# Patient Record
Sex: Female | Born: 1965 | Race: White | Hispanic: No | Smoking: Never smoker
Health system: Southern US, Community
[De-identification: ages and names within clinical notes are randomized; demographics above are authoritative.]

## PROBLEM LIST (undated history)

## (undated) DIAGNOSIS — B019 Varicella without complication: Secondary | ICD-10-CM

## (undated) DIAGNOSIS — R51 Headache: Secondary | ICD-10-CM

## (undated) DIAGNOSIS — F329 Major depressive disorder, single episode, unspecified: Secondary | ICD-10-CM

## (undated) DIAGNOSIS — Z8489 Family history of other specified conditions: Secondary | ICD-10-CM

## (undated) DIAGNOSIS — Z9889 Other specified postprocedural states: Secondary | ICD-10-CM

## (undated) DIAGNOSIS — M1990 Unspecified osteoarthritis, unspecified site: Secondary | ICD-10-CM

## (undated) DIAGNOSIS — F32A Depression, unspecified: Secondary | ICD-10-CM

## (undated) DIAGNOSIS — K219 Gastro-esophageal reflux disease without esophagitis: Secondary | ICD-10-CM

## (undated) DIAGNOSIS — Z86018 Personal history of other benign neoplasm: Secondary | ICD-10-CM

## (undated) DIAGNOSIS — N390 Urinary tract infection, site not specified: Secondary | ICD-10-CM

## (undated) DIAGNOSIS — N6019 Diffuse cystic mastopathy of unspecified breast: Secondary | ICD-10-CM

## (undated) DIAGNOSIS — R112 Nausea with vomiting, unspecified: Secondary | ICD-10-CM

## (undated) DIAGNOSIS — I479 Paroxysmal tachycardia, unspecified: Secondary | ICD-10-CM

## (undated) DIAGNOSIS — M779 Enthesopathy, unspecified: Secondary | ICD-10-CM

## (undated) HISTORY — DX: Varicella without complication: B01.9

## (undated) HISTORY — DX: Urinary tract infection, site not specified: N39.0

## (undated) HISTORY — DX: Diffuse cystic mastopathy of unspecified breast: N60.19

## (undated) HISTORY — DX: Depression, unspecified: F32.A

## (undated) HISTORY — PX: DIAGNOSTIC LAPAROSCOPY: SUR761

## (undated) HISTORY — PX: BREAST CYST ASPIRATION: SHX578

## (undated) HISTORY — DX: Major depressive disorder, single episode, unspecified: F32.9

## (undated) HISTORY — DX: Enthesopathy, unspecified: M77.9

## (undated) HISTORY — DX: Headache: R51

## (undated) HISTORY — PX: TUBAL LIGATION: SHX77

---
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
## 1987-06-26 HISTORY — PX: ECTOPIC PREGNANCY SURGERY: SHX613

## 1989-06-25 HISTORY — PX: DIAGNOSTIC LAPAROSCOPY: SUR761

## 1990-06-25 HISTORY — PX: OTHER SURGICAL HISTORY: SHX169

## 1991-06-26 HISTORY — PX: ECTOPIC PREGNANCY SURGERY: SHX613

## 2000-06-25 HISTORY — PX: UTERINE FIBROID SURGERY: SHX826

## 2002-06-25 HISTORY — PX: OTHER SURGICAL HISTORY: SHX169

## 2002-06-25 HISTORY — PX: UTERINE FIBROID SURGERY: SHX826

## 2005-03-13 ENCOUNTER — Ambulatory Visit: Payer: Self-pay | Admitting: General Surgery

## 2005-11-02 ENCOUNTER — Ambulatory Visit: Payer: Self-pay | Admitting: Unknown Physician Specialty

## 2005-12-31 ENCOUNTER — Ambulatory Visit: Payer: Self-pay | Admitting: Unknown Physician Specialty

## 2006-04-01 ENCOUNTER — Ambulatory Visit: Payer: Self-pay | Admitting: General Surgery

## 2006-06-03 ENCOUNTER — Ambulatory Visit: Payer: Self-pay | Admitting: Unknown Physician Specialty

## 2006-06-25 HISTORY — PX: ABDOMINAL HYSTERECTOMY: SHX81

## 2006-06-25 HISTORY — PX: COLONOSCOPY: SHX174

## 2006-11-24 LAB — HM COLONOSCOPY

## 2007-01-28 ENCOUNTER — Ambulatory Visit: Payer: Self-pay | Admitting: Family Medicine

## 2007-02-03 ENCOUNTER — Ambulatory Visit: Payer: Self-pay | Admitting: Family Medicine

## 2007-03-07 DIAGNOSIS — Z86018 Personal history of other benign neoplasm: Secondary | ICD-10-CM

## 2007-03-07 HISTORY — DX: Personal history of other benign neoplasm: Z86.018

## 2007-03-24 ENCOUNTER — Ambulatory Visit: Payer: Self-pay | Admitting: Unknown Physician Specialty

## 2007-03-27 ENCOUNTER — Ambulatory Visit: Payer: Self-pay | Admitting: Unknown Physician Specialty

## 2007-04-29 ENCOUNTER — Ambulatory Visit: Payer: Self-pay | Admitting: General Surgery

## 2008-05-04 ENCOUNTER — Ambulatory Visit: Payer: Self-pay | Admitting: General Surgery

## 2008-11-10 ENCOUNTER — Ambulatory Visit: Payer: Self-pay | Admitting: Family Medicine

## 2009-06-20 ENCOUNTER — Ambulatory Visit: Payer: Self-pay | Admitting: General Surgery

## 2009-06-25 HISTORY — PX: BUNIONECTOMY: SHX129

## 2010-06-28 ENCOUNTER — Ambulatory Visit: Payer: Self-pay | Admitting: General Surgery

## 2011-03-08 ENCOUNTER — Ambulatory Visit: Payer: Self-pay | Admitting: Family Medicine

## 2011-06-26 DIAGNOSIS — N6019 Diffuse cystic mastopathy of unspecified breast: Secondary | ICD-10-CM

## 2011-06-26 HISTORY — DX: Diffuse cystic mastopathy of unspecified breast: N60.19

## 2011-08-29 ENCOUNTER — Ambulatory Visit: Payer: Self-pay | Admitting: General Surgery

## 2012-05-05 ENCOUNTER — Encounter: Payer: Self-pay | Admitting: Internal Medicine

## 2012-05-05 ENCOUNTER — Ambulatory Visit (INDEPENDENT_AMBULATORY_CARE_PROVIDER_SITE_OTHER): Payer: 59 | Admitting: Internal Medicine

## 2012-05-05 VITALS — BP 96/60 | HR 61 | Temp 97.9°F | Ht 64.5 in | Wt 121.5 lb

## 2012-05-05 DIAGNOSIS — D709 Neutropenia, unspecified: Secondary | ICD-10-CM

## 2012-05-05 DIAGNOSIS — R51 Headache: Secondary | ICD-10-CM

## 2012-05-05 DIAGNOSIS — R6889 Other general symptoms and signs: Secondary | ICD-10-CM

## 2012-05-05 NOTE — Patient Instructions (Addendum)
Keep a headache diary for 3 weeks to correlate your caffeine intake and exercise with occurrence of headaches  Try to limit used of Alleve and excedrin to not more than once per week  If headaches continue with this frequency we may try a preventive medication  return in January for repeat CBC and CMET (labs,  No fasting required)

## 2012-05-05 NOTE — Progress Notes (Signed)
Patient ID: Cynthia Houston, female   DOB: September 04, 1965, 46 y.o.   MRN: [id number] Patient Active Problem List  Diagnosis  . Headache  . ASCUS (atypical squamous cells of undetermined significance) on Pap smear  . Neutropenia    Subjective:  CC:   Chief Complaint  Patient presents with  . Establish Care    HPI:   [name] is a 46 y.o. female who presents as a new patient to establish primary care with the chief complaint of   Past Medical History  Diagnosis Date  . Chicken pox   . Depression   . Headache   . Urinary tract infection     Past Surgical History  Procedure Date  . Breast biopsy   . Appendectomy   . Ectopic pregnancy surgery 1989  . Uterine fibroid surgery 2002    x2 2004  . Abdominal hysterectomy 2008    supracervical     Family History  Problem Relation Age of Onset  . Hyperlipidemia Mother   . Hypertension Mother   . Arthritis Mother     rheumatoid arthritis  . Crohn's disease Father   . Heart disease Father 82    CABG  . Hyperlipidemia Father   . Hypertension Father     History   Social History  . Marital Status: Single    Spouse Name: N/A    Number of Children: N/A  . Years of Education: N/A   Occupational History  . Not on file.   Social History Main Topics  . Smoking status: Never Smoker   . Smokeless tobacco: Not on file  . Alcohol Use: Yes     Comment: occassionally  . Drug Use: Not on file  . Sexually Active: Not on file   Other Topics Concern  . Not on file   Social History Narrative  . No narrative on file         @ALLHX @    Review of Systems:   The remainder of the review of systems was negative except those addressed in the HPI.       Objective:  BP 96/60  Pulse 61  Temp 97.9 F (36.6 C) (Oral)  Ht 5' 4.5" (1.638 m)  Wt 121 lb 8 oz (55.112 kg)  BMI 20.53 kg/m2  SpO2 98%  General appearance: alert, cooperative and appears stated age Ears: normal TM's and external ear canals both  ears Throat: lips, mucosa, and tongue normal; teeth and gums normal Neck: no adenopathy, no carotid bruit, supple, symmetrical, trachea midline and thyroid not enlarged, symmetric, no tenderness/mass/nodules Back: symmetric, no curvature. ROM normal. No CVA tenderness. Lungs: clear to auscultation bilaterally Heart: regular rate and rhythm, S1, S2 normal, no murmur, click, rub or gallop Abdomen: soft, non-tender; bowel sounds normal; no masses,  no organomegaly Pulses: 2+ and symmetric Skin: Skin color, texture, turgor normal. No rashes or lesions Lymph nodes: Cervical, supraclavicular, and axillary nodes normal.  Assessment and Plan:  Headache And a new onset with averaging 2-3 per week. ALG unclear. No signs of cluster headache. No history of hypertension. No history of migraines. Neurological exam is normal. Her symptoms may be due to neck pain due to and eye strain due to chronic use of computer and handheld phone  Versus early development of medication rebound due to frequent use of NSAIDs.. Asked her  To keep a diary and to limit her use of NSAIDs to no more than once per week.  ASCUS (atypical squamous cells of undetermined significance) on  Pap smear On 2012. 2013 Pap Smear Was Normal. She Will Return in May 2014 for Repeat Pap Smear. HPVs Were Negative.  Neutropenia She has had a mild leukopenia with mild neutropenia for over a year. Serial CBCs from her previous PCP have been stable. She has no history of recurrent infections. Reassurance provided that there is no further workup indicated at this time.   Updated Medication List No outpatient encounter prescriptions on file as of 05/05/2012.     Orders Placed This Encounter  Procedures  . HM PAP SMEAR    No Follow-up on file.

## 2012-05-07 DIAGNOSIS — Z8742 Personal history of other diseases of the female genital tract: Secondary | ICD-10-CM

## 2012-05-07 DIAGNOSIS — D709 Neutropenia, unspecified: Secondary | ICD-10-CM | POA: Insufficient documentation

## 2012-05-07 DIAGNOSIS — R51 Headache: Secondary | ICD-10-CM | POA: Insufficient documentation

## 2012-05-07 DIAGNOSIS — R519 Headache, unspecified: Secondary | ICD-10-CM | POA: Insufficient documentation

## 2012-05-07 HISTORY — DX: Personal history of other diseases of the female genital tract: Z87.42

## 2012-05-07 NOTE — Assessment & Plan Note (Signed)
And a new onset with averaging 2-3 per week. ALG unclear. No signs of cluster headache. No history of hypertension. No history of migraines. Neurological exam is normal. Her symptoms may be due to neck pain due to and eye strain due to chronic use of computer and handheld phone  Versus early development of medication rebound due to frequent use of NSAIDs.. Asked her  To keep a diary and to limit her use of NSAIDs to no more than once per week.

## 2012-05-07 NOTE — Assessment & Plan Note (Signed)
On 2012. 2013 Pap Smear Was Normal. She Will Return in May 2014 for Repeat Pap Smear. HPVs Were Negative.

## 2012-05-07 NOTE — Assessment & Plan Note (Signed)
She has had a mild leukopenia with mild neutropenia for over a year. Serial CBCs from her previous PCP have been stable. She has no history of recurrent infections. Reassurance provided that there is no further workup indicated at this time.

## 2012-08-02 ENCOUNTER — Encounter: Payer: Self-pay | Admitting: *Deleted

## 2012-08-09 ENCOUNTER — Other Ambulatory Visit: Payer: Self-pay

## 2012-09-01 ENCOUNTER — Ambulatory Visit: Payer: Self-pay | Admitting: General Surgery

## 2012-09-18 ENCOUNTER — Ambulatory Visit: Payer: Self-pay | Admitting: General Surgery

## 2012-09-24 ENCOUNTER — Encounter: Payer: Self-pay | Admitting: General Surgery

## 2012-09-24 ENCOUNTER — Ambulatory Visit (INDEPENDENT_AMBULATORY_CARE_PROVIDER_SITE_OTHER): Payer: 59 | Admitting: General Surgery

## 2012-09-24 VITALS — BP 116/70 | HR 78 | Resp 12 | Ht 63.0 in | Wt 116.0 lb

## 2012-09-24 DIAGNOSIS — Z1231 Encounter for screening mammogram for malignant neoplasm of breast: Secondary | ICD-10-CM

## 2012-09-24 DIAGNOSIS — N6019 Diffuse cystic mastopathy of unspecified breast: Secondary | ICD-10-CM

## 2012-09-24 NOTE — Patient Instructions (Addendum)
Patient will be asked to return to the office in one year with a bilateral screening mammogram. 

## 2012-09-24 NOTE — Progress Notes (Signed)
Patient ID: Cynthia Houston, female   DOB: 02-01-1966, 47 y.o.   MRN: [id number]  Chief Complaint  Patient presents with  . Follow-up    mammogram    HPI Cynthia Houston is a 47 y.o. female here troday for her follow up mammgram 3/10/14cat 2 . Patient had a right breast biopsy in past . No family history of cancers.   No new breast problems.History of FCD.  Marland KitchenHPI Past Medical History  Diagnosis Date  . Chicken pox   . Depression   . Headache   . Urinary tract infection   . Tendinitis     right elbow  . Diffuse cystic mastopathy 2013    Past Surgical History  Procedure Laterality Date  . Breast biopsy    . Appendectomy    . Ectopic pregnancy surgery  1989  . Uterine fibroid surgery  2002    x2 2004  . Abdominal hysterectomy  2008    supracervical   . Gynecological surgery   2004  . Diagnostic laparoscopy    . Tubal ligation    . Bunionectomy Right 2011  . Colonoscopy  2008    Dr. Mechele Collin    Family History  Problem Relation Age of Onset  . Hyperlipidemia Mother   . Hypertension Mother   . Arthritis Mother     rheumatoid arthritis  . Crohn's disease Father   . Heart disease Father 66    CABG  . Hyperlipidemia Father   . Hypertension Father     Social History History  Substance Use Topics  . Smoking status: Never Smoker   . Smokeless tobacco: Never Used  . Alcohol Use: No     Comment: occassionally    Allergies  Allergen Reactions  . Augmentin (Amoxicillin-Pot Clavulanate)   . Percocet (Oxycodone-Acetaminophen)     Current Outpatient Prescriptions  Medication Sig Dispense Refill  . Multiple Vitamins-Minerals (MULTIVITAMIN & MINERAL PO) Take by mouth.      . Pseudoephedrine HCl (BL 12 HOUR COLD PO) Take by mouth.       No current facility-administered medications for this visit.    Review of Systems Review of Systems  Constitutional: Negative.   Respiratory: Negative.   Cardiovascular: Negative.     Blood pressure 116/70, pulse 78, resp. rate 12,  height 5\' 3"  (1.6 m), weight 116 lb (52.617 kg).  Physical Exam Physical Exam  Constitutional: She appears well-developed and well-nourished.  Eyes: Conjunctivae are normal.  Neck: Normal range of motion. Neck supple.  Cardiovascular: Normal rate, regular rhythm and normal heart sounds.   Pulmonary/Chest: Effort normal and breath sounds normal. Right breast exhibits mass. Right breast exhibits no inverted nipple, no nipple discharge, no skin change and no tenderness. Left breast exhibits no inverted nipple, no mass, no nipple discharge, no skin change and no tenderness.    Abdominal: Soft. Bowel sounds are normal.  Lymphadenopathy:    She has no cervical adenopathy.    She has no axillary adenopathy.    Data Reviewed Mammogram reviewed-birads2.   Assessment    Stable breast exam     Plan    Routine 1 yr f/u         [facility] 09/26/2012, 11:54 AM

## 2012-09-26 ENCOUNTER — Encounter: Payer: Self-pay | Admitting: General Surgery

## 2012-11-04 ENCOUNTER — Encounter: Payer: Self-pay | Admitting: General Surgery

## 2012-11-19 ENCOUNTER — Encounter: Payer: Self-pay | Admitting: Internal Medicine

## 2012-11-19 ENCOUNTER — Ambulatory Visit (INDEPENDENT_AMBULATORY_CARE_PROVIDER_SITE_OTHER): Payer: 59 | Admitting: Internal Medicine

## 2012-11-19 ENCOUNTER — Other Ambulatory Visit (HOSPITAL_COMMUNITY)
Admission: RE | Admit: 2012-11-19 | Discharge: 2012-11-19 | Disposition: A | Discharge status: Home / Self Care | Payer: 59 | Source: Ambulatory Visit | Attending: Internal Medicine | Admitting: Internal Medicine

## 2012-11-19 VITALS — BP 108/72 | HR 61 | Temp 97.6°F | Resp 16 | Ht 63.0 in | Wt 118.0 lb

## 2012-11-19 DIAGNOSIS — R6889 Other general symptoms and signs: Secondary | ICD-10-CM

## 2012-11-19 DIAGNOSIS — E538 Deficiency of other specified B group vitamins: Secondary | ICD-10-CM

## 2012-11-19 DIAGNOSIS — Z0000 Encounter for general adult medical examination without abnormal findings: Secondary | ICD-10-CM

## 2012-11-19 DIAGNOSIS — Z1151 Encounter for screening for human papillomavirus (HPV): Secondary | ICD-10-CM | POA: Insufficient documentation

## 2012-11-19 DIAGNOSIS — N390 Urinary tract infection, site not specified: Secondary | ICD-10-CM

## 2012-11-19 DIAGNOSIS — K5900 Constipation, unspecified: Secondary | ICD-10-CM

## 2012-11-19 DIAGNOSIS — Z01419 Encounter for gynecological examination (general) (routine) without abnormal findings: Secondary | ICD-10-CM | POA: Insufficient documentation

## 2012-11-19 DIAGNOSIS — G4700 Insomnia, unspecified: Secondary | ICD-10-CM

## 2012-11-19 LAB — POCT URINALYSIS DIPSTICK
Ketones, UA: NEGATIVE
Protein, UA: NEGATIVE
pH, UA: 7

## 2012-11-19 MED ORDER — ALPRAZOLAM 0.25 MG PO TABS: 0.2500 mg | ORAL_TABLET | Freq: Every evening | ORAL | Status: DC | PRN

## 2012-11-19 MED ORDER — CIPROFLOXACIN HCL 250 MG PO TABS: 250.0000 mg | ORAL_TABLET | Freq: Two times a day (BID) | ORAL | Status: DC

## 2012-11-19 NOTE — Patient Instructions (Addendum)
I sent an rx for cipro to your pharmacy to cover your UTI  Trial of alprazolam for insomnia ,  Use only as needed   Return for fasting labs at your convenience.  Your constipation is cuasing your abdominal pain.  Try getting 25 to 35 g fiber in your diet daily using high fiber low carb flatbreads,  And Atkins bars as snacks,  You cna also try the low  Carb pasta from [employer] ([employer]. [employer] and [employer])  [employer] "Carb balance?  (2 sizes ) whole wheat tortilla  Has 26 g fiber  [place] [employer] low carb tortilla  Has 10 g

## 2012-11-20 DIAGNOSIS — N390 Urinary tract infection, site not specified: Secondary | ICD-10-CM | POA: Insufficient documentation

## 2012-11-20 DIAGNOSIS — K5900 Constipation, unspecified: Secondary | ICD-10-CM | POA: Insufficient documentation

## 2012-11-20 DIAGNOSIS — Z0000 Encounter for general adult medical examination without abnormal findings: Secondary | ICD-10-CM | POA: Insufficient documentation

## 2012-11-20 DIAGNOSIS — K5909 Other constipation: Secondary | ICD-10-CM | POA: Insufficient documentation

## 2012-11-20 DIAGNOSIS — Z0001 Encounter for general adult medical examination with abnormal findings: Secondary | ICD-10-CM | POA: Insufficient documentation

## 2012-11-20 LAB — URINALYSIS, ROUTINE W REFLEX MICROSCOPIC
Bilirubin Urine: NEGATIVE
Hgb urine dipstick: NEGATIVE
Total Protein, Urine: NEGATIVE
Urine Glucose: NEGATIVE
Urobilinogen, UA: 0.2 (ref 0.0–1.0)

## 2012-11-20 NOTE — Assessment & Plan Note (Signed)
PAP was done today 

## 2012-11-20 NOTE — Assessment & Plan Note (Signed)
Chronic, with left upper quadrant pain . Recommended increasing fiber in diet using high fiber breads and adding nighttime bulk forming laxative,   Consider Liness or amitiza if symptoms do not improve.  Sill check lytes, thyroid function

## 2012-11-20 NOTE — Assessment & Plan Note (Signed)
UA was obtained to investigate symptoms of frequency for the last few days;  empiric cipro prescribed for abnormal UA

## 2012-11-20 NOTE — Assessment & Plan Note (Signed)
Annual comprehensive exam was done including breast, pelvic and PAP smear. All screenings have been addressed .  

## 2012-11-20 NOTE — Progress Notes (Signed)
Patient ID: Cynthia Houston, female   DOB: 10-08-1965, 47 y.o.   MRN: [id number]   Subjective:     Cynthia Houston is a 47 y.o. female and is here for a comprehensive physical exam. The patient reports constipation and abdominal fullness.  History   Social History  . Marital Status: Single    Spouse Name: N/A    Number of Children: N/A  . Years of Education: N/A   Occupational History  . Not on file.   Social History Main Topics  . Smoking status: Never Smoker   . Smokeless tobacco: Never Used  . Alcohol Use: No     Comment: occassionally  . Drug Use: No  . Sexually Active: Not on file   Other Topics Concern  . Not on file   Social History Narrative  . No narrative on file   Health Maintenance  Topic Date Due  . Tetanus/tdap  08/07/1982  . Influenza Vaccine  02/23/2013  . Pap Smear  11/05/2014    The following portions of the patient's history were reviewed and updated as appropriate: allergies, current medications, past family history, past medical history, past social history, past surgical history and problem list.  Review of Systems A comprehensive review of systems was negative.   Objective:   BP 108/72  Pulse 61  Temp(Src) 97.6 F (36.4 C) (Oral)  Resp 16  Ht 5\' 3"  (1.6 m)  Wt 118 lb (53.524 kg)  BMI 20.91 kg/m2  SpO2 99%  General Appearance:    Alert, cooperative, no distress, appears stated age  Head:    Normocephalic, without obvious abnormality, atraumatic  Eyes:    PERRL, conjunctiva/corneas clear, EOM's intact, fundi    benign, both eyes  Ears:    Normal TM's and external ear canals, both ears  Nose:   Nares normal, septum midline, mucosa normal, no drainage    or sinus tenderness  Throat:   Lips, mucosa, and tongue normal; teeth and gums normal  Neck:   Supple, symmetrical, trachea midline, no adenopathy;    thyroid:  no enlargement/tenderness/nodules; no carotid   bruit or JVD  Back:     Symmetric, no curvature, ROM normal, no CVA tenderness   Lungs:     Clear to auscultation bilaterally, respirations unlabored  Chest Wall:    No tenderness or deformity   Heart:    Regular rate and rhythm, S1 and S2 normal, no murmur, rub   or gallop  Breast Exam:    No tenderness, masses, or nipple abnormality  Abdomen:     Soft, non-tender, bowel sounds active all four quadrants,    no masses, no organomegaly  Genitalia:    Pelvic: cervix normal in appearance, external genitalia normal, no adnexal masses or tenderness, no cervical motion tenderness, rectovaginal septum normal, uterus normal size, shape, and consistency and vagina normal without discharge  Extremities:   Extremities normal, atraumatic, no cyanosis or edema  Pulses:   2+ and symmetric all extremities  Skin:   Skin color, texture, turgor normal, no rashes or lesions  Lymph nodes:   Cervical, supraclavicular, and axillary nodes normal  Neurologic:   CNII-XII intact, normal strength, sensation and reflexes    throughout   Assessment:   Unspecified constipation Chronic, with left upper quadrant pain . Recommended increasing fiber in diet using high fiber breads and adding nighttime bulk forming laxative,   Consider Liness or amitiza if symptoms do not improve.  Sill check lytes, thyroid function    ASCUS (atypical  squamous cells of undetermined significance) on Pap smear PAP was done today   Routine general medical examination at a health care facility Annual comprehensive exam was done including breast, pelvic and PAP smear. All screenings have been addressed .   Urinary tract infection, site not specified UA was obtained to investigate symptoms of frequency for the last few days;  empiric cipro prescribed for abnormal UA   Updated Medication List Outpatient Encounter Prescriptions as of 11/19/2012  Medication Sig Dispense Refill  . cyanocobalamin 1000 MCG tablet Take 100 mcg by mouth daily.      . Multiple Vitamins-Minerals (MULTIVITAMIN & MINERAL PO) Take by mouth.       . Pseudoephedrine HCl (BL 12 HOUR COLD PO) Take by mouth.      . ALPRAZolam (XANAX) 0.25 MG tablet Take 1 tablet (0.25 mg total) by mouth at bedtime as needed for sleep.  30 tablet  0  . ciprofloxacin (CIPRO) 250 MG tablet Take 1 tablet (250 mg total) by mouth 2 (two) times daily.  6 tablet  0   No facility-administered encounter medications on file as of 11/19/2012.

## 2012-11-24 ENCOUNTER — Encounter: Payer: Self-pay | Admitting: Internal Medicine

## 2012-11-27 ENCOUNTER — Telehealth: Payer: Self-pay | Admitting: Internal Medicine

## 2012-11-27 ENCOUNTER — Other Ambulatory Visit: Payer: Self-pay | Admitting: Internal Medicine

## 2012-11-27 NOTE — Telephone Encounter (Signed)
Lab order faxed.

## 2012-11-27 NOTE — Telephone Encounter (Signed)
Patient is at [facility] waiting to have her labs done could you fax over an order to have her labs done.

## 2012-12-01 ENCOUNTER — Telehealth: Payer: Self-pay | Admitting: Internal Medicine

## 2012-12-01 NOTE — Telephone Encounter (Signed)
Left message to call office

## 2012-12-01 NOTE — Telephone Encounter (Signed)
All labs normal,. Including thyroid function and cholesterol   

## 2012-12-02 NOTE — Telephone Encounter (Signed)
Patient notified by phone of results, Patient stated she would like to know if B12 was also within normal please advise. Checked labs still showing future.

## 2012-12-03 NOTE — Telephone Encounter (Signed)
Labs were done at outside facility and I cannot find them to review again bc they are on their way to be scanned.  If I said they were all normal,  An nothing still pending,  Then they were normal.  But we could request them again to be sure.

## 2012-12-05 NOTE — Telephone Encounter (Signed)
Patient notified labs normal.

## 2013-01-07 ENCOUNTER — Encounter: Payer: Self-pay | Admitting: Internal Medicine

## 2013-03-09 ENCOUNTER — Ambulatory Visit (INDEPENDENT_AMBULATORY_CARE_PROVIDER_SITE_OTHER): Payer: 59 | Admitting: Adult Health

## 2013-03-09 ENCOUNTER — Encounter: Payer: Self-pay | Admitting: Adult Health

## 2013-03-09 VITALS — BP 108/70 | HR 66 | Temp 98.3°F | Resp 12 | Ht 63.0 in | Wt 118.0 lb

## 2013-03-09 DIAGNOSIS — R1013 Epigastric pain: Secondary | ICD-10-CM | POA: Insufficient documentation

## 2013-03-09 DIAGNOSIS — K219 Gastro-esophageal reflux disease without esophagitis: Secondary | ICD-10-CM

## 2013-03-09 DIAGNOSIS — R51 Headache: Secondary | ICD-10-CM

## 2013-03-09 MED ORDER — CYCLOBENZAPRINE HCL 5 MG PO TABS: 5.0000 mg | ORAL_TABLET | Freq: Three times a day (TID) | ORAL | Status: DC | PRN

## 2013-03-09 NOTE — Assessment & Plan Note (Deleted)
Zantac 150 mg bid and omeprazole 20 mg daily.

## 2013-03-09 NOTE — Progress Notes (Signed)
  Subjective:    Patient ID: Cynthia Houston, female    DOB: 17-Dec-1965, 47 y.o.   MRN: [id number]  HPI  Patient presents with epigastric pain that has been ongoing x 3 weeks. She describes pain as a burning pain and then also radiates up into her esophagus. She denies feeling burning sensation like heart burn. She tried Zantac but not consistently. She is under some stress both at home and work. No nausea or vomiting associated with pain. She eats at least every 3 hours. Denies fever, chills. She has not seen any blood in her stool. She denies any new medication. She does take aleve occasionally for headache. She reports taking aleve ~ twice weekly.  She is c/o headache mainly in the back of her head. She reports pain with moving her head. She reports headache are occuring more often. She denies any nausea with headache or photophobia. Patient normally exercises but with everything going on she has not been working out. She does not currently have a headache.   Current Outpatient Prescriptions on File Prior to Visit  Medication Sig Dispense Refill  . ALPRAZolam (XANAX) 0.25 MG tablet Take 1 tablet (0.25 mg total) by mouth at bedtime as needed for sleep.  30 tablet  0  . cyanocobalamin 1000 MCG tablet Take 100 mcg by mouth daily.      . Multiple Vitamins-Minerals (MULTIVITAMIN & MINERAL PO) Take by mouth.       No current facility-administered medications on file prior to visit.    Review of Systems  Gastrointestinal: Positive for nausea and abdominal pain. Negative for vomiting.       Epigastric discomfort, burning.  Neurological: Positive for headaches. Negative for dizziness, weakness and numbness.    BP 108/70  Pulse 66  Temp(Src) 98.3 F (36.8 C) (Oral)  Resp 12  Ht 5\' 3"  (1.6 m)  Wt 118 lb (53.524 kg)  BMI 20.91 kg/m2  SpO2 99%    Objective:   Physical Exam  Constitutional: She appears well-developed and well-nourished. No distress.  Cardiovascular: Normal rate and regular  rhythm.   Pulmonary/Chest: Effort normal and breath sounds normal.  Abdominal: Soft. Bowel sounds are normal. She exhibits no distension and no mass. There is tenderness. There is no rebound and no guarding.  Epigastric and RUQ tenderness with palpation      Assessment & Plan:

## 2013-03-09 NOTE — Assessment & Plan Note (Addendum)
Averaging 2-3 per week. Appear to be tension type HA. Focal neuro exam unremarkable. 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 She reports her station is ergonomically adjusted specifically for her. She watches her posture, etc. Still has tension and muscle tightness of trapezius muscle. Also increases stress r/t work and family. Only using NSAIDs ~ 2 times per week so not enough to cause rebound HA. Pt reports having MRI which was normal. Try short course of flexeril to see if helps with muscle tightness. RTC in 2 weeks for f/u

## 2013-03-09 NOTE — Assessment & Plan Note (Signed)
Suspect symptoms related to increased stress. On exam patient had RUQ and epigastric tenderness upon palpation. Complete abdominal u/s. Start Zantac 150 mg bid and omeprazole 20 mg daily. RTC in 2 weeks for f/u.

## 2013-03-09 NOTE — Patient Instructions (Addendum)
  I am sending you for an ultrasound to evaluate your upper abdominal discomfort.  For your stomach please start taking Zantac 150 mg twice a day and Prilosec (omeprazole 20 mg) take 30 minutes before breakfast once a day. You will do this for 2 weeks.  You can also take TUMS as directed. Use this for symptoms that may occur during the day.  Flexeril for tight muscles in the neck area. This may make you sleepy. May want to take only at bedtime and as needed.

## 2013-03-11 ENCOUNTER — Encounter: Payer: Self-pay | Admitting: *Deleted

## 2013-03-11 ENCOUNTER — Ambulatory Visit: Payer: Self-pay | Admitting: Adult Health

## 2013-03-13 ENCOUNTER — Other Ambulatory Visit: Payer: Self-pay | Admitting: Adult Health

## 2013-03-13 ENCOUNTER — Telehealth: Payer: Self-pay | Admitting: *Deleted

## 2013-03-13 DIAGNOSIS — R51 Headache: Secondary | ICD-10-CM

## 2013-03-13 NOTE — Telephone Encounter (Signed)
I would like to send her for a head CT.

## 2013-03-13 NOTE — Telephone Encounter (Signed)
Called and advised pt of normal abdominal ultrasound results. She states her epigastric pain has improved with use of Zantac and Omeprazole, but continues to have a headache. Has not improved since seen in office on 03/09/13. Trying to avoid Ibuprofen due to epigastric pain. Has been using Flexeril at night which allows her to sleep, but when she wakes up, the pain still persists, in the same location behind left ear. States the pain is continuous. Would like to know what to do from here.

## 2013-03-13 NOTE — Telephone Encounter (Signed)
Pt in agreement for CT head

## 2013-03-13 NOTE — Telephone Encounter (Signed)
Ordered. Amber will call with appt.

## 2013-03-13 NOTE — Telephone Encounter (Signed)
Left message for pt to return my call.

## 2013-03-13 NOTE — Telephone Encounter (Signed)
Called and advised patient of results. See telephone note.

## 2013-03-23 ENCOUNTER — Ambulatory Visit (INDEPENDENT_AMBULATORY_CARE_PROVIDER_SITE_OTHER): Payer: 59 | Admitting: Adult Health

## 2013-03-23 ENCOUNTER — Encounter: Payer: Self-pay | Admitting: *Deleted

## 2013-03-23 ENCOUNTER — Encounter: Payer: Self-pay | Admitting: Adult Health

## 2013-03-23 VITALS — BP 102/62 | HR 63 | Temp 98.4°F | Resp 12 | Wt 118.5 lb

## 2013-03-23 DIAGNOSIS — R1013 Epigastric pain: Secondary | ICD-10-CM

## 2013-03-23 DIAGNOSIS — R51 Headache: Secondary | ICD-10-CM

## 2013-03-23 DIAGNOSIS — M542 Cervicalgia: Secondary | ICD-10-CM

## 2013-03-23 MED ORDER — CELECOXIB 200 MG PO CAPS: 200.0000 mg | ORAL_CAPSULE | Freq: Two times a day (BID) | ORAL | Status: DC

## 2013-03-23 NOTE — Patient Instructions (Addendum)
  Please have blood work done at [facility] as soon as possible.  Start Celebrex today. Take 1 daily.  Continue flexeril.  Continue Prilosec.  Go to [facility] for cervical spine xray.

## 2013-03-23 NOTE — Assessment & Plan Note (Signed)
Posterior headache.? Arthritis,? Sleep apnea,? Inflammatory response secondary to arteritis, ?blood clot. Now sinus pressure not helping but I do not think that the initial cause of her headaches were due to sinus pressure since she was not having any of these symptoms 3 weeks ago. Check sed rate, CRP and d-dimer to rule out blood clot, arteritis. Obtain plain films of cervical spine to evaluate for any bony abnormality or arthritis. Start Celebrex to see if this helps with her pain. Continue Flexeril. If all are normal then consider MRI.

## 2013-03-23 NOTE — Progress Notes (Signed)
Subjective:    Patient ID: Cynthia Houston, female    DOB: July 08, 1965, 47 y.o.   MRN: [id number]  HPI  Patient is a pleasant 47 year old female who presents to clinic for followup of epigastric pain and occipital headache x3 weeks.  1. Pertaining to her epigastric pain, patient was started on Zantac 150 mg twice a day and omeprazole 20 mg daily. She reports her symptoms have completely subsided since starting this medication. She still has considerable amount of stress related to her job and personal life which were believed to be contributors to her upset stomach; however, she is feeling significant improvement in this area.  2. Pertaining to her headaches, she is still experiencing frequent occipital pain. She is associating them with neck pain and discomfort. She denies any nausea occurring with a headache or photophobia. She is complaining of having visual disturbances in the form of blurriness occasionally in the right eye associated with headaches. She was started on Flexeril which she reports helps relax her and helps her sleep but unfortunately her headaches continue. MRI was ordered but patient reports that she has not met her deductible and she would have a considerable out-of-pocket expense. There is no dizziness, numbness or tingling associated with these headaches. She works for [employer] in the [place] but believes that her station is ergonomically sound and not contributing to these new onset headaches. Patient does not currently have a headache. She does not drink in excess. She has not associated these headaches with any dietary changes. She reports yesterday she began to feel some sinus pressure. She did not have any of these symptoms 3 weeks ago when the headaches began. Patient exercises regularly. Her husband has mentioned to her that she snores.   Current Outpatient Prescriptions on File Prior to Visit  Medication Sig Dispense Refill  . ALPRAZolam (XANAX) 0.25 MG tablet Take 1  tablet (0.25 mg total) by mouth at bedtime as needed for sleep.  30 tablet  0  . cyanocobalamin 1000 MCG tablet Take 100 mcg by mouth daily.      . cyclobenzaprine (FLEXERIL) 5 MG tablet Take 1 tablet (5 mg total) by mouth 3 (three) times daily as needed for muscle spasms.  30 tablet  1  . Multiple Vitamins-Minerals (MULTIVITAMIN & MINERAL PO) Take by mouth.       No current facility-administered medications on file prior to visit.    Review of Systems  Constitutional: Negative.   HENT: Positive for neck pain and sinus pressure.   Eyes: Positive for visual disturbance.       Occasional blurriness of the right eye associated with headache.  Respiratory: Negative.   Cardiovascular: Negative.   Gastrointestinal: Negative for nausea and vomiting.  Genitourinary: Negative.   Musculoskeletal:       Neck soreness and discomfort with movement.  Skin: Negative.   Neurological: Positive for headaches. Negative for dizziness, tremors, seizures, syncope, speech difficulty, weakness, light-headedness and numbness.  Hematological: Negative.   Psychiatric/Behavioral: Negative.        Objective:   Physical Exam  Constitutional: She is oriented to person, place, and time. She appears well-developed and well-nourished. No distress.  HENT:  Head: Normocephalic and atraumatic.  Right Ear: External ear normal.  Left Ear: External ear normal.  Mouth/Throat: Oropharynx is clear and moist.  Eyes: Conjunctivae are normal. Pupils are equal, round, and reactive to light.  Neck: Normal range of motion. Neck supple.  Cardiovascular: Normal rate and regular rhythm.   Pulmonary/Chest: Effort normal.  No respiratory distress.  Musculoskeletal: Normal range of motion.  Tenderness with palpation at base of skull on cervical spine  Lymphadenopathy:    She has no cervical adenopathy.  Neurological: She is alert and oriented to person, place, and time.  Skin: Skin is warm and dry.  Psychiatric: She has a  normal mood and affect. Her behavior is normal. Judgment and thought content normal.    BP 102/62  Pulse 63  Temp(Src) 98.4 F (36.9 C) (Oral)  Resp 12  Wt 118 lb 8 oz (53.751 kg)  BMI 21 kg/m2  SpO2 98%      Assessment & Plan:

## 2013-03-23 NOTE — Assessment & Plan Note (Signed)
Symptoms completely resolved with omeprazole and Zantac. She will discontinue the Zantac at this time. Continue omeprazole. Will continue to follow

## 2013-03-24 ENCOUNTER — Ambulatory Visit (INDEPENDENT_AMBULATORY_CARE_PROVIDER_SITE_OTHER)
Admission: RE | Admit: 2013-03-24 | Discharge: 2013-03-24 | Disposition: A | Discharge status: Home / Self Care | Payer: 59 | Source: Ambulatory Visit | Attending: Adult Health | Admitting: Adult Health

## 2013-03-24 DIAGNOSIS — M542 Cervicalgia: Secondary | ICD-10-CM

## 2013-03-27 ENCOUNTER — Telehealth: Payer: Self-pay | Admitting: Internal Medicine

## 2013-03-27 NOTE — Telephone Encounter (Signed)
The blood workRaquel ordered on her showed no signs of blood clots or vasculitis.

## 2013-03-27 NOTE — Telephone Encounter (Signed)
Left message for patient to return call to office. 

## 2013-04-01 ENCOUNTER — Telehealth: Payer: Self-pay | Admitting: Internal Medicine

## 2013-04-01 ENCOUNTER — Other Ambulatory Visit: Payer: Self-pay | Admitting: Adult Health

## 2013-04-01 DIAGNOSIS — M542 Cervicalgia: Secondary | ICD-10-CM

## 2013-04-01 NOTE — Telephone Encounter (Signed)
Pt notified of xray results and bloodwork. Would like physical therapy referral. Celebrex is helping some, but not completely relieving discomfort.

## 2013-04-01 NOTE — Telephone Encounter (Signed)
Order placed for referral.  

## 2013-04-01 NOTE — Telephone Encounter (Signed)
Asking for results of x-rays and blood work done last week; seen by [employer]

## 2013-04-01 NOTE — Telephone Encounter (Signed)
Called and advised patient of results

## 2013-04-10 ENCOUNTER — Encounter: Payer: Self-pay | Admitting: Adult Health

## 2013-04-30 ENCOUNTER — Other Ambulatory Visit: Payer: Self-pay

## 2013-08-27 ENCOUNTER — Encounter: Payer: Self-pay | Admitting: Internal Medicine

## 2013-08-27 ENCOUNTER — Other Ambulatory Visit: Payer: Self-pay | Admitting: Internal Medicine

## 2013-08-27 DIAGNOSIS — G4700 Insomnia, unspecified: Secondary | ICD-10-CM

## 2013-08-27 MED ORDER — ALPRAZOLAM 0.25 MG PO TABS: 0.2500 mg | ORAL_TABLET | Freq: Every evening | ORAL | Status: DC | PRN

## 2013-09-01 ENCOUNTER — Encounter: Payer: Self-pay | Admitting: Internal Medicine

## 2013-09-01 NOTE — Telephone Encounter (Signed)
Please advise if OKay?

## 2013-10-15 ENCOUNTER — Ambulatory Visit: Payer: 59 | Admitting: General Surgery

## 2013-11-10 ENCOUNTER — Encounter: Payer: Self-pay | Admitting: *Deleted

## 2013-11-23 ENCOUNTER — Ambulatory Visit (INDEPENDENT_AMBULATORY_CARE_PROVIDER_SITE_OTHER): Payer: 59 | Admitting: Internal Medicine

## 2013-11-23 ENCOUNTER — Encounter: Payer: Self-pay | Admitting: Internal Medicine

## 2013-11-23 ENCOUNTER — Other Ambulatory Visit (HOSPITAL_COMMUNITY)
Admission: RE | Admit: 2013-11-23 | Discharge: 2013-11-23 | Disposition: A | Discharge status: Home / Self Care | Payer: 59 | Source: Ambulatory Visit | Attending: Internal Medicine | Admitting: Internal Medicine

## 2013-11-23 VITALS — BP 104/78 | HR 63 | Temp 97.7°F | Resp 16 | Ht 63.5 in | Wt 115.0 lb

## 2013-11-23 DIAGNOSIS — Z0000 Encounter for general adult medical examination without abnormal findings: Secondary | ICD-10-CM

## 2013-11-23 DIAGNOSIS — Z1239 Encounter for other screening for malignant neoplasm of breast: Secondary | ICD-10-CM

## 2013-11-23 DIAGNOSIS — Z1151 Encounter for screening for human papillomavirus (HPV): Secondary | ICD-10-CM | POA: Insufficient documentation

## 2013-11-23 DIAGNOSIS — Z01419 Encounter for gynecological examination (general) (routine) without abnormal findings: Secondary | ICD-10-CM | POA: Insufficient documentation

## 2013-11-23 DIAGNOSIS — Z8742 Personal history of other diseases of the female genital tract: Secondary | ICD-10-CM

## 2013-11-23 NOTE — Patient Instructions (Signed)
You had your annual  wellness exam today  We will schedule your mammogram soon.   We will contact you with the bloodwork results

## 2013-11-23 NOTE — Progress Notes (Signed)
Pre-visit discussion using our clinic review tool. No additional management support is needed unless otherwise documented below in the visit note.  

## 2013-11-23 NOTE — Assessment & Plan Note (Signed)
nomal PAP smears 2013 and 2014

## 2013-11-24 NOTE — Progress Notes (Signed)
Patient ID: Cynthia Houston, female   DOB: 24-Jul-1965, 48 y.o.   MRN: [id number]  Subjective:     Cynthia Houston is a 48 y.o. female and is here for a comprehensive physical exam. The patient reports no problems.  History   Social History  . Marital Status: Single    Spouse Name: N/A    Number of Children: N/A  . Years of Education: N/A   Occupational History  . Not on file.   Social History Main Topics  . Smoking status: Never Smoker   . Smokeless tobacco: Never Used  . Alcohol Use: No     Comment: occassionally  . Drug Use: No  . Sexual Activity: Not on file   Other Topics Concern  . Not on file   Social History Narrative  . No narrative on file   Health Maintenance  Topic Date Due  . Mammogram  08/07/2013  . Influenza Vaccine  01/23/2014  . Pap Smear  11/20/2015  . Colonoscopy  11/23/2016  . Tetanus/tdap  11/23/2016    The following portions of the patient's history were reviewed and updated as appropriate: allergies, current medications, past family history, past medical history, past social history, past surgical history and problem list.  Review of Systems A comprehensive review of systems was negative.  Objective:   BP 104/78  Pulse 63  Temp(Src) 97.7 F (36.5 C) (Oral)  Resp 16  Ht 5' 3.5" (1.613 m)  Wt 115 lb (52.164 kg)  BMI 20.05 kg/m2  SpO2 99%  General Appearance:    Alert, cooperative, no distress, appears stated age  Head:    Normocephalic, without obvious abnormality, atraumatic  Eyes:    PERRL, conjunctiva/corneas clear, EOM's intact, fundi    benign, both eyes  Ears:    Normal TM's and external ear canals, both ears  Nose:   Nares normal, septum midline, mucosa normal, no drainage    or sinus tenderness  Throat:   Lips, mucosa, and tongue normal; teeth and gums normal  Neck:   Supple, symmetrical, trachea midline, no adenopathy;    thyroid:  no enlargement/tenderness/nodules; no carotid   bruit or JVD  Back:     Symmetric, no curvature,  ROM normal, no CVA tenderness  Lungs:     Clear to auscultation bilaterally, respirations unlabored  Chest Wall:    No tenderness or deformity   Heart:    Regular rate and rhythm, S1 and S2 normal, no murmur, rub   or gallop  Breast Exam:    No tenderness, masses, or nipple abnormality  Abdomen:     Soft, non-tender, bowel sounds active all four quadrants,    no masses, no organomegaly  Genitalia:    Pelvic: cervix normal in appearance, external genitalia normal, no adnexal masses or tenderness, no cervical motion tenderness, rectovaginal septum normal, uterus normal size, shape, and consistency and vagina normal without discharge  Extremities:   Extremities normal, atraumatic, no cyanosis or edema  Pulses:   2+ and symmetric all extremities  Skin:   Skin color, texture, turgor normal, no rashes or lesions  Lymph nodes:   Cervical, supraclavicular, and axillary nodes normal  Neurologic:   CNII-XII intact, normal strength, sensation and reflexes    throughout    Assessment and plan:   History of abnormal cervical Pap smear nomal PAP smears 2013 and 2014 .  Repeat done troday  Routine general medical examination at a health care facility  Annual comprehensive exam was done including breast, pelvic and  PAP smear. All screenings have been addressed .    Updated Medication List Outpatient Encounter Prescriptions as of 11/23/2013  Medication Sig  . ALPRAZolam (XANAX) 0.25 MG tablet Take 1 tablet (0.25 mg total) by mouth at bedtime as needed for sleep.  . cyanocobalamin 1000 MCG tablet Take 5,000 mcg by mouth daily.   . Multiple Vitamins-Minerals (MULTIVITAMIN & MINERAL PO) Take by mouth.  . [DISCONTINUED] celecoxib (CELEBREX) 200 MG capsule Take 1 capsule (200 mg total) by mouth 2 (two) times daily.  . [DISCONTINUED] cyclobenzaprine (FLEXERIL) 5 MG tablet Take 1 tablet (5 mg total) by mouth 3 (three) times daily as needed for muscle spasms.

## 2013-11-24 NOTE — Assessment & Plan Note (Signed)
Annual comprehensive exam was done including breast, pelvic and PAP smear. All screenings have been addressed .  

## 2013-11-25 ENCOUNTER — Other Ambulatory Visit: Payer: Self-pay | Admitting: Internal Medicine

## 2013-11-25 ENCOUNTER — Encounter: Payer: Self-pay | Admitting: Internal Medicine

## 2013-11-25 LAB — HM PAP SMEAR: HM PAP: NORMAL

## 2013-11-30 ENCOUNTER — Other Ambulatory Visit: Payer: Self-pay | Admitting: General Surgery

## 2013-12-03 LAB — NICOTINE METABOLITE, SERUM: NICOTINE: NEGATIVE

## 2013-12-03 LAB — LP+CREAT+HB A1C
CHOLESTEROL TOTAL: 178 mg/dL (ref 100–199)
Chol/HDL Ratio: 2.4 ratio units (ref 0.0–4.4)
Creatinine, Ser: 0.81 mg/dL (ref 0.57–1.00)
GFR calc Af Amer: 98 mL/min/{1.73_m2} (ref >59)
GFR calc non Af Amer: 85 mL/min/{1.73_m2} (ref >59)
HDL: 73 mg/dL (ref >39)
HEMOGLOBIN A1C: 5.7 % — AB (ref 4.8–5.6)
LDL CALC: 97 mg/dL (ref 0–99)
Triglycerides: 42 mg/dL (ref 0–149)
VLDL CHOLESTEROL CAL: 8 mg/dL (ref 5–40)

## 2013-12-03 LAB — GLUCOSE, FASTING: GLUCOSE: 85 mg/dL (ref 65–99)

## 2013-12-03 LAB — VITAMIN D 25 HYDROXY (VIT D DEFICIENCY, FRACTURES): VIT D 25 HYDROXY: 37.3 ng/mL (ref 30.0–100.0)

## 2013-12-03 LAB — TSH: TSH: 1.75 u[IU]/mL (ref 0.450–4.500)

## 2013-12-07 ENCOUNTER — Encounter: Payer: Self-pay | Admitting: *Deleted

## 2013-12-08 ENCOUNTER — Encounter: Payer: Self-pay | Admitting: Internal Medicine

## 2013-12-08 ENCOUNTER — Telehealth: Payer: Self-pay | Admitting: Internal Medicine

## 2013-12-08 NOTE — Telephone Encounter (Signed)
Patient dropped off form for health screening. Patient asked that she be called when paper work is ready. Paper work in Dr. Lupita Dawn box.msn

## 2013-12-09 DIAGNOSIS — Z0279 Encounter for issue of other medical certificate: Secondary | ICD-10-CM

## 2013-12-09 NOTE — Telephone Encounter (Signed)
Called pt and notified form ready for pickup.

## 2013-12-10 ENCOUNTER — Ambulatory Visit
Admission: RE | Admit: 2013-12-10 | Discharge: 2013-12-10 | Disposition: A | Discharge status: Home / Self Care | Payer: 59 | Source: Ambulatory Visit | Attending: Internal Medicine | Admitting: Internal Medicine

## 2013-12-10 DIAGNOSIS — Z1239 Encounter for other screening for malignant neoplasm of breast: Secondary | ICD-10-CM

## 2013-12-15 ENCOUNTER — Encounter: Payer: Self-pay | Admitting: Internal Medicine

## 2013-12-17 NOTE — Telephone Encounter (Signed)
Mailed unread message to pt  

## 2014-04-26 ENCOUNTER — Encounter: Payer: Self-pay | Admitting: Internal Medicine

## 2014-04-27 ENCOUNTER — Telehealth: Payer: Self-pay | Admitting: Internal Medicine

## 2014-04-27 ENCOUNTER — Encounter: Payer: Self-pay | Admitting: Internal Medicine

## 2014-04-27 NOTE — Telephone Encounter (Signed)
Yes that is fine

## 2014-04-27 NOTE — Telephone Encounter (Signed)
The patient sent an e-mail about her condition and Dr. Derrel Nip wanted her to make an appointment . I don't have anything available until 11.25.15. Please advise.

## 2014-04-27 NOTE — Telephone Encounter (Signed)
First available ok end of November?

## 2014-05-07 ENCOUNTER — Telehealth: Payer: Self-pay | Admitting: Internal Medicine

## 2014-05-07 ENCOUNTER — Ambulatory Visit (INDEPENDENT_AMBULATORY_CARE_PROVIDER_SITE_OTHER): Payer: 59 | Admitting: Internal Medicine

## 2014-05-07 ENCOUNTER — Encounter: Payer: Self-pay | Admitting: Internal Medicine

## 2014-05-07 VITALS — BP 94/68 | HR 72 | Temp 97.8°F | Ht 63.5 in | Wt 116.5 lb

## 2014-05-07 DIAGNOSIS — R5382 Chronic fatigue, unspecified: Secondary | ICD-10-CM | POA: Insufficient documentation

## 2014-05-07 DIAGNOSIS — R634 Abnormal weight loss: Secondary | ICD-10-CM

## 2014-05-07 DIAGNOSIS — IMO0002 Reserved for concepts with insufficient information to code with codable children: Secondary | ICD-10-CM

## 2014-05-07 DIAGNOSIS — N941 Unspecified dyspareunia: Secondary | ICD-10-CM | POA: Insufficient documentation

## 2014-05-07 NOTE — Patient Instructions (Signed)
Labs today at [facility].

## 2014-05-07 NOTE — Telephone Encounter (Signed)
Pt needs 2wk (30 mins) for recheck. Please advise where to add pt.msn

## 2014-05-07 NOTE — Telephone Encounter (Signed)
4:30 PM Nov 24th,  Tuesday

## 2014-05-07 NOTE — Assessment & Plan Note (Signed)
Likely secondary to atrophic vaginitis. Discussed topical estrogen treatment. She would prefer to hold off for now until lab results back.

## 2014-05-07 NOTE — Assessment & Plan Note (Signed)
Wt Readings from Last 3 Encounters:  05/07/14 116 lb 8 oz (52.844 kg)  11/23/13 115 lb (52.164 kg)  03/23/13 118 lb 8 oz (53.751 kg)   Body mass index is 20.31 kg/(m^2). Encouraged healthy diet, adequate rest at night. Will check CBC, CMP, TSH with labs.

## 2014-05-07 NOTE — Progress Notes (Signed)
Pre visit review using our clinic review tool, if applicable. No additional management support is needed unless otherwise documented below in the visit note. 

## 2014-05-07 NOTE — Assessment & Plan Note (Signed)
Fatigue over last 1-2 months. No focal symptoms Exam is normal. Will check CBC, CMP, thyroid function, B12, ferritin with labs. If labs normal, would favor getting sleep study. Follow up in 2 weeks.

## 2014-05-07 NOTE — Progress Notes (Signed)
Subjective:    Patient ID: Cynthia Houston, female    DOB: 1966/02/14, 48 y.o.   MRN: [id number]  HPI 48YO female presents for acute visit.  Feeling tired over last month or so. Notes hair is thinning. Feels "foggy" at times. Concerned about weight loss. Wt Readings from Last 3 Encounters:  05/07/14 116 lb 8 oz (52.844 kg)  11/23/13 115 lb (52.164 kg)  03/23/13 118 lb 8 oz (53.751 kg)   No changes at home or work. Lives with fiance 17years. Feels safe at home. Has 50YO doing well. No new stressors. No changes to diet. No nausea, diarrhea. Some constipation chronically. S/p hysterectomy. No blood in stool or black stool. Colonoscopy 2008 normal.  Concerned about low sex drive. Has pain with intercourse. Does not use lubricants. No vaginal bleeding.  Sleeps about 6hr per night. Husband notes that she snores. Never tested for OSA.   Review of Systems  Constitutional: Positive for fatigue and unexpected weight change. Negative for fever, chills and appetite change.  HENT: Negative for congestion.   Eyes: Negative for visual disturbance.  Respiratory: Negative for cough, shortness of breath and wheezing.   Cardiovascular: Negative for chest pain and leg swelling.  Gastrointestinal: Negative for nausea, vomiting, abdominal pain, diarrhea, constipation, blood in stool and rectal pain.  Genitourinary: Positive for vaginal pain. Negative for frequency, vaginal bleeding, vaginal discharge and pelvic pain.  Skin: Negative for color change and rash.  Hematological: Negative for adenopathy. Does not bruise/bleed easily.  Psychiatric/Behavioral: Positive for decreased concentration. Negative for sleep disturbance and dysphoric mood. The patient is not nervous/anxious.        Objective:    BP 94/68 mmHg  Pulse 72  Temp(Src) 97.8 F (36.6 C) (Oral)  Ht 5' 3.5" (1.613 m)  Wt 116 lb 8 oz (52.844 kg)  BMI 20.31 kg/m2  SpO2 97% Physical Exam  Constitutional: She is oriented to  person, place, and time. She appears well-developed and well-nourished. No distress.  HENT:  Head: Normocephalic and atraumatic.  Right Ear: External ear normal.  Left Ear: External ear normal.  Nose: Nose normal.  Mouth/Throat: Oropharynx is clear and moist. No oropharyngeal exudate.  Eyes: Conjunctivae and EOM are normal. Pupils are equal, round, and reactive to light. Right eye exhibits no discharge.  Neck: Normal range of motion. Neck supple. No thyromegaly present.  Cardiovascular: Normal rate, regular rhythm, normal heart sounds and intact distal pulses.  Exam reveals no gallop and no friction rub.   No murmur heard. Pulmonary/Chest: Effort normal. No respiratory distress. She has no wheezes. She has no rales.  Abdominal: Soft. Bowel sounds are normal. She exhibits no distension and no mass. There is no tenderness. There is no rebound and no guarding.  Musculoskeletal: Normal range of motion. She exhibits no edema or tenderness.  Lymphadenopathy:    She has no cervical adenopathy.  Neurological: She is alert and oriented to person, place, and time. No cranial nerve deficit. Coordination normal.  Skin: Skin is warm and dry. No rash noted. She is not diaphoretic. No erythema. No pallor.  Psychiatric: She has a normal mood and affect. Her behavior is normal. Judgment and thought content normal.          Assessment & Plan:   Problem List Items Addressed This Visit      Unprioritized   Chronic fatigue - Primary    Fatigue over last 1-2 months. No focal symptoms Exam is normal. Will check CBC, CMP, thyroid function, B12, ferritin  with labs. If labs normal, would favor getting sleep study. Follow up in 2 weeks.    Dyspareunia    Likely secondary to atrophic vaginitis. Discussed topical estrogen treatment. She would prefer to hold off for now until lab results back.    Loss of weight    Wt Readings from Last 3 Encounters:  05/07/14 116 lb 8 oz (52.844 kg)  11/23/13 115 lb (52.164  kg)  03/23/13 118 lb 8 oz (53.751 kg)   Body mass index is 20.31 kg/(m^2). Encouraged healthy diet, adequate rest at night. Will check CBC, CMP, TSH with labs.        Return in about 2 weeks (around 05/21/2014) for Recheck.

## 2014-05-07 NOTE — Telephone Encounter (Signed)
Was seen in office today by Dr. Gilford Rile two week follow up advised 30 min. Please advise.

## 2014-05-08 LAB — HEMOGLOBIN A1C: Hgb A1c MFr Bld: 5.9 % (ref 4.0–6.0)

## 2014-05-08 LAB — HEPATIC FUNCTION PANEL
ALK PHOS: 53 U/L (ref 25–125)
ALT: 21 U/L (ref 7–35)
AST: 15 U/L (ref 13–35)
Bilirubin, Total: 0.3 mg/dL

## 2014-05-08 LAB — BASIC METABOLIC PANEL
BUN: 24 mg/dL — AB (ref 4–21)
CREATININE: 0.8 mg/dL (ref 0.5–1.1)
Glucose: 86 mg/dL
POTASSIUM: 4.1 mmol/L (ref 3.4–5.3)
Sodium: 140 mmol/L (ref 137–147)

## 2014-05-08 LAB — CBC AND DIFFERENTIAL
HCT: 41 % (ref 36–46)
HEMOGLOBIN: 13.7 g/dL (ref 12.0–16.0)
Neutrophils Absolute: 2 /uL
PLATELETS: 247 10*3/uL (ref 150–399)
WBC: 4.4 10^3/mL

## 2014-05-08 LAB — TSH: TSH: 1.52 u[IU]/mL (ref 0.41–5.90)

## 2014-05-10 ENCOUNTER — Telehealth: Payer: Self-pay | Admitting: Internal Medicine

## 2014-05-10 NOTE — Telephone Encounter (Signed)
LMTCB to schedule follow up appt on 11/24 8 or 4:30/msn

## 2014-05-11 ENCOUNTER — Telehealth: Payer: Self-pay | Admitting: Internal Medicine

## 2014-05-11 DIAGNOSIS — R5382 Chronic fatigue, unspecified: Secondary | ICD-10-CM

## 2014-05-13 ENCOUNTER — Encounter: Payer: Self-pay | Admitting: Internal Medicine

## 2014-05-18 ENCOUNTER — Encounter: Payer: Self-pay | Admitting: Internal Medicine

## 2014-05-18 ENCOUNTER — Ambulatory Visit (INDEPENDENT_AMBULATORY_CARE_PROVIDER_SITE_OTHER): Payer: 59 | Admitting: Internal Medicine

## 2014-05-18 VITALS — BP 120/72 | HR 63 | Temp 97.4°F | Resp 14 | Ht 63.0 in | Wt 114.0 lb

## 2014-05-18 DIAGNOSIS — R634 Abnormal weight loss: Secondary | ICD-10-CM

## 2014-05-18 DIAGNOSIS — IMO0002 Reserved for concepts with insufficient information to code with codable children: Secondary | ICD-10-CM

## 2014-05-18 DIAGNOSIS — R5382 Chronic fatigue, unspecified: Secondary | ICD-10-CM

## 2014-05-18 DIAGNOSIS — N941 Dyspareunia: Secondary | ICD-10-CM

## 2014-05-18 DIAGNOSIS — R140 Abdominal distension (gaseous): Secondary | ICD-10-CM

## 2014-05-18 DIAGNOSIS — R102 Pelvic and perineal pain: Secondary | ICD-10-CM | POA: Insufficient documentation

## 2014-05-18 NOTE — Patient Instructions (Addendum)
I am ordering an ultrasound and a CA 125  To look at your ovaries since you are having pelvic pain  If everything is normal,  I suggest we try a low dose of mirtazapine (remeron) to help your appetite, sleep and fatigue.  It is an antidepressant  We also discussed use of vaginal estrogen  To help your discomfort during intercourse

## 2014-05-18 NOTE — Progress Notes (Signed)
Pre-visit discussion using our clinic review tool. No additional management support is needed unless otherwise documented below in the visit note.  

## 2014-05-18 NOTE — Progress Notes (Signed)
Patient ID: Cynthia Houston, female   DOB: 09-Dec-1965, 49 y.o.   MRN: [id number]   Patient Active Problem List   Diagnosis Date Noted  . Pelvic pain in female 05/18/2014  . Chronic fatigue 05/07/2014  . Dyspareunia 05/07/2014  . Loss of weight 05/07/2014  . Pain, neck 03/23/2013  . Unspecified constipation 11/20/2012  . Routine general medical examination at a health care facility 11/20/2012  . Fibrocystic breast disease 09/24/2012  . History of abnormal cervical Pap smear 05/07/2012  . Neutropenia 05/07/2012    Subjective:  CC:   Chief Complaint  Patient presents with  . Follow-up    fatigue no appetite patient states not any better.    HPI:        Cynthia Houston is a 48 y.o. female who presents for  Follow up on 4 week history of persistent fatigue, accompanied by anorexia,  Decreased libido and mild abdominal and pelvic bloating.  Patient was seen by Dr Gilford Rile two weeks ago and screened for thyroid, liver and kidney disorders as well as anemia..  All labs were normal.  She reports persistence of symptoms and now reports weight tloss of 3 or 4 lbs.  She denies feel depressed but does not trouble with frequent wakenings. She does not snore.  She is avoiding intercourse with husband due to dyspareunia and the lack of intimacy is becoming a source of conflict within her marriage.  Her professional life has been a source of increased stress due to her immediate boss being "high maintenance" and demanding.  Past Medical History  Diagnosis Date  . Chicken pox   . Depression   . Headache(784.0)   . Urinary tract infection   . Tendinitis     right elbow  . Diffuse cystic mastopathy 2013    Past Surgical History  Procedure Laterality Date  . Breast biopsy    . Appendectomy    . Ectopic pregnancy surgery  1989  . Uterine fibroid surgery  2002    x2 2004  . Abdominal hysterectomy  2008    supracervical   . Gynecological surgery   2004  . Diagnostic laparoscopy    . Tubal  ligation    . Bunionectomy Right 2011  . Colonoscopy  2008    Dr. Vira Agar       The following portions of the patient's history were reviewed and updated as appropriate: Allergies, current medications, and problem list.    Review of Systems:   Patient denies headache, fevers, malaise, unintentional weight loss, skin rash, eye pain, sinus congestion and sinus pain, sore throat, dysphagia,  hemoptysis , cough, dyspnea, wheezing, chest pain, palpitations, orthopnea, edema, abdominal pain, nausea, melena, diarrhea, constipation, flank pain, dysuria, hematuria, urinary  Frequency, nocturia, numbness, tingling, seizures,  Focal weakness, Loss of consciousness,  Tremor, insomnia, depression, anxiety, and suicidal ideation.     History   Social History  . Marital Status: Single    Spouse Name: N/A    Number of Children: N/A  . Years of Education: N/A   Occupational History  . Not on file.   Social History Main Topics  . Smoking status: Never Smoker   . Smokeless tobacco: Never Used  . Alcohol Use: No     Comment: occassionally  . Drug Use: No  . Sexual Activity: Not on file   Other Topics Concern  . Not on file   Social History Narrative    Objective:  Filed Vitals:   05/18/14 1641  BP:  120/72  Pulse: 63  Temp: 97.4 F (36.3 C)  Resp: 14     General appearance: alert, cooperative and appears stated age Ears: normal TM's and external ear canals both ears Throat: lips, mucosa, and tongue normal; teeth and gums normal Neck: no adenopathy, no carotid bruit, supple, symmetrical, trachea midline and thyroid not enlarged, symmetric, no tenderness/mass/nodules Back: symmetric, no curvature. ROM normal. No CVA tenderness. Lungs: clear to auscultation bilaterally Heart: regular rate and rhythm, S1, S2 normal, no murmur, click, rub or gallop Abdomen: soft, non-tender; bowel sounds normal; no masses,  no organomegaly Pulses: 2+ and symmetric Skin: Skin color, texture,  turgor normal. No rashes or lesions Lymph nodes: Cervical, supraclavicular, and axillary nodes normal.  Assessment and Plan:  Pelvic pain in female With bloating and loss of weight.  Need to rule out varian CA .  CA 125 and pelvic ultrasound ordered.   Dyspareunia Discussed trial of vaginal estrogen pending evaluation of other symptoms with pelvic ultrasound.   Chronic fatigue All erologies to rule out deficiencies were normal, and she has no history of snoring. Discussed trial of antidepressant using mirtazipine.   Updated Medication List Outpatient Encounter Prescriptions as of 05/18/2014  Medication Sig  . ALPRAZolam (XANAX) 0.25 MG tablet Take 1 tablet (0.25 mg total) by mouth at bedtime as needed for sleep.  . cyanocobalamin 1000 MCG tablet Take 5,000 mcg by mouth daily.   . Multiple Vitamins-Minerals (MULTIVITAMIN & MINERAL PO) Take by mouth.     Orders Placed This Encounter  Procedures  . US Pelvis Complete    No Follow-up on file.

## 2014-05-20 NOTE — Assessment & Plan Note (Addendum)
All erologies to rule out deficiencies were normal, and she has no history of snoring. Discussed trial of antidepressant using mirtazipine.

## 2014-05-20 NOTE — Assessment & Plan Note (Signed)
With bloating and loss of weight.  Need to rule out varian CA .  CA 125 and pelvic ultrasound ordered.

## 2014-05-20 NOTE — Assessment & Plan Note (Signed)
Discussed trial of vaginal estrogen pending evaluation of other symptoms with pelvic ultrasound.

## 2014-05-25 ENCOUNTER — Encounter: Payer: Self-pay | Admitting: Internal Medicine

## 2014-05-31 ENCOUNTER — Telehealth: Payer: Self-pay | Admitting: Internal Medicine

## 2014-06-01 ENCOUNTER — Ambulatory Visit: Payer: Self-pay | Admitting: Internal Medicine

## 2014-06-03 ENCOUNTER — Telehealth: Payer: Self-pay | Admitting: Internal Medicine

## 2014-06-03 DIAGNOSIS — R102 Pelvic and perineal pain: Secondary | ICD-10-CM

## 2014-06-03 DIAGNOSIS — Z90711 Acquired absence of uterus with remaining cervical stump: Secondary | ICD-10-CM | POA: Insufficient documentation

## 2014-06-04 ENCOUNTER — Other Ambulatory Visit: Payer: Self-pay | Admitting: Internal Medicine

## 2014-06-04 MED ORDER — MIRTAZAPINE 15 MG PO TABS: ORAL_TABLET | ORAL | Status: DC

## 2014-06-04 MED ORDER — ESTRADIOL 0.1 MG/GM VA CREA: 1.0000 | TOPICAL_CREAM | Freq: Every day | VAGINAL | Status: DC

## 2014-06-16 ENCOUNTER — Encounter: Payer: Self-pay | Admitting: Internal Medicine

## 2014-11-29 ENCOUNTER — Encounter: Payer: 59 | Admitting: Internal Medicine

## 2014-12-09 ENCOUNTER — Ambulatory Visit (INDEPENDENT_AMBULATORY_CARE_PROVIDER_SITE_OTHER): Payer: 59 | Admitting: Internal Medicine

## 2014-12-09 ENCOUNTER — Encounter: Payer: Self-pay | Admitting: Internal Medicine

## 2014-12-09 VITALS — BP 102/70 | HR 73 | Temp 98.2°F | Ht 63.75 in | Wt 114.5 lb

## 2014-12-09 DIAGNOSIS — K5909 Other constipation: Secondary | ICD-10-CM | POA: Diagnosis not present

## 2014-12-09 DIAGNOSIS — Z1239 Encounter for other screening for malignant neoplasm of breast: Secondary | ICD-10-CM

## 2014-12-09 DIAGNOSIS — Z0000 Encounter for general adult medical examination without abnormal findings: Secondary | ICD-10-CM | POA: Diagnosis not present

## 2014-12-09 DIAGNOSIS — Z1211 Encounter for screening for malignant neoplasm of colon: Secondary | ICD-10-CM | POA: Diagnosis not present

## 2014-12-09 MED ORDER — ONDANSETRON HCL 4 MG PO TABS: 4.0000 mg | ORAL_TABLET | Freq: Three times a day (TID) | ORAL | Status: DC | PRN

## 2014-12-09 NOTE — Patient Instructions (Signed)
I recommend trying to increase your daily fiber intake to 25 to 35 grams daily.  several dietary suggestions include:    Mission low carb whole wheat tortillas which have 26 g fiber/serving, Toufayan flatbread has around 10 g fiber.  Atkins protein bars which have 8 to 10 g fiber.    The best tasting high fiber cereal is Heritage flakes by [employer]     recommend getting the majority of your calcium and Vitamin D  through diet rather than supplements given the recent association of calcium supplements with increased coronary artery calcium scores  Try the almond milk by [employer] or the toasted coconut milk available at [facility]  Instead of cow's milk  They are lactose free:  Health Maintenance Adopting a healthy lifestyle and getting preventive care can go a long way to promote health and wellness. Talk with your health care provider about what schedule of regular examinations is right for you. This is a good chance for you to check in with your provider about disease prevention and staying healthy. In between checkups, there are plenty of things you can do on your own. Experts have done a lot of research about which lifestyle changes and preventive measures are most likely to keep you healthy. Ask your health care provider for more information. WEIGHT AND DIET  Eat a healthy diet  Be sure to include plenty of vegetables, fruits, low-fat dairy products, and lean protein.  Do not eat a lot of foods high in solid fats, added sugars, or salt.  Get regular exercise. This is one of the most important things you can do for your health.  Most adults should exercise for at least 150 minutes each week. The exercise should increase your heart rate and make you sweat (moderate-intensity exercise).  Most adults should also do strengthening exercises at least twice a week. This is in addition to the moderate-intensity exercise.  Maintain a healthy weight  Body mass index (BMI) is a measurement that can be  used to identify possible weight problems. It estimates body fat based on height and weight. Your health care provider can help determine your BMI and help you achieve or maintain a healthy weight.  For females 40 years of age and older:   A BMI below 18.5 is considered underweight.  A BMI of 18.5 to 24.9 is normal.  A BMI of 25 to 29.9 is considered overweight.  A BMI of 30 and above is considered obese.  Watch levels of cholesterol and blood lipids  You should start having your blood tested for lipids and cholesterol at 51 years of age, then have this test every 5 years.  You may need to have your cholesterol levels checked more often if:  Your lipid or cholesterol levels are high.  You are older than 51 years of age.  You are at high risk for heart disease.  CANCER SCREENING   Lung Cancer  Lung cancer screening is recommended for adults 76-28 years old who are at high risk for lung cancer because of a history of smoking.  A yearly low-dose CT scan of the lungs is recommended for people who:  Currently smoke.  Have quit within the past 15 years.  Have at least a 30-pack-year history of smoking. A pack year is smoking an average of one pack of cigarettes a day for 1 year.  Yearly screening should continue until it has been 15 years since you quit.  Yearly screening should stop if you develop  a health problem that would prevent you from having lung cancer treatment.  Breast Cancer  Practice breast self-awareness. This means understanding how your breasts normally appear and feel.  It also means doing regular breast self-exams. Let your health care provider know about any changes, no matter how small.  If you are in your 20s or 30s, you should have a clinical breast exam (CBE) by a health care provider every 1-3 years as part of a regular health exam.  If you are 62 or older, have a CBE every year. Also consider having a breast X-ray (mammogram) every year.  If  you have a family history of breast cancer, talk to your health care provider about genetic screening.  If you are at high risk for breast cancer, talk to your health care provider about having an MRI and a mammogram every year.  Breast cancer gene (BRCA) assessment is recommended for women who have family members with BRCA-related cancers. BRCA-related cancers include:  Breast.  Ovarian.  Tubal.  Peritoneal cancers.  Results of the assessment will determine the need for genetic counseling and BRCA1 and BRCA2 testing. Cervical Cancer Routine pelvic examinations to screen for cervical cancer are no longer recommended for nonpregnant women who are considered low risk for cancer of the pelvic organs (ovaries, uterus, and vagina) and who do not have symptoms. A pelvic examination may be necessary if you have symptoms including those associated with pelvic infections. Ask your health care provider if a screening pelvic exam is right for you.   The Pap test is the screening test for cervical cancer for women who are considered at risk.  If you had a hysterectomy for a problem that was not cancer or a condition that could lead to cancer, then you no longer need Pap tests.  If you are older than 65 years, and you have had normal Pap tests for the past 10 years, you no longer need to have Pap tests.  If you have had past treatment for cervical cancer or a condition that could lead to cancer, you need Pap tests and screening for cancer for at least 20 years after your treatment.  If you no longer get a Pap test, assess your risk factors if they change (such as having a new sexual partner). This can affect whether you should start being screened again.  Some women have medical problems that increase their chance of getting cervical cancer. If this is the case for you, your health care provider may recommend more frequent screening and Pap tests.  The human papillomavirus (HPV) test is another test  that may be used for cervical cancer screening. The HPV test looks for the virus that can cause cell changes in the cervix. The cells collected during the Pap test can be tested for HPV.  The HPV test can be used to screen women 87 years of age and older. Getting tested for HPV can extend the interval between normal Pap tests from three to five years.  An HPV test also should be used to screen women of any age who have unclear Pap test results.  After 51 years of age, women should have HPV testing as often as Pap tests.  Colorectal Cancer  This type of cancer can be detected and often prevented.  Routine colorectal cancer screening usually begins at 51 years of age and continues through 51 years of age.  Your health care provider may recommend screening at an earlier age if you have risk  factors for colon cancer.  Your health care provider may also recommend using home test kits to check for hidden blood in the stool.  A small camera at the end of a tube can be used to examine your colon directly (sigmoidoscopy or colonoscopy). This is done to check for the earliest forms of colorectal cancer.  Routine screening usually begins at age 90.  Direct examination of the colon should be repeated every 5-10 years through 51 years of age. However, you may need to be screened more often if early forms of precancerous polyps or small growths are found. Skin Cancer  Check your skin from head to toe regularly.  Tell your health care provider about any new moles or changes in moles, especially if there is a change in a mole's shape or color.  Also tell your health care provider if you have a mole that is larger than the size of a pencil eraser.  Always use sunscreen. Apply sunscreen liberally and repeatedly throughout the day.  Protect yourself by wearing long sleeves, pants, a wide-brimmed hat, and sunglasses whenever you are outside. HEART DISEASE, DIABETES, AND HIGH BLOOD PRESSURE   Have  your blood pressure checked at least every 1-2 years. High blood pressure causes heart disease and increases the risk of stroke.  If you are between 1 years and 38 years old, ask your health care provider if you should take aspirin to prevent strokes.  Have regular diabetes screenings. This involves taking a blood sample to check your fasting blood sugar level.  If you are at a normal weight and have a low risk for diabetes, have this test once every three years after 51 years of age.  If you are overweight and have a high risk for diabetes, consider being tested at a younger age or more often. PREVENTING INFECTION  Hepatitis B  If you have a higher risk for hepatitis B, you should be screened for this virus. You are considered at high risk for hepatitis B if:  You were born in a country where hepatitis B is common. Ask your health care provider which countries are considered high risk.  Your parents were born in a high-risk country, and you have not been immunized against hepatitis B (hepatitis B vaccine).  You have HIV or AIDS.  You use needles to inject street drugs.  You live with someone who has hepatitis B.  You have had sex with someone who has hepatitis B.  You get hemodialysis treatment.  You take certain medicines for conditions, including cancer, organ transplantation, and autoimmune conditions. Hepatitis C  Blood testing is recommended for:  Everyone born from [birth year] through [birth year].  Anyone with known risk factors for hepatitis C. Sexually transmitted infections (STIs)  You should be screened for sexually transmitted infections (STIs) including gonorrhea and chlamydia if:  You are sexually active and are younger than 51 years of age.  You are older than 51 years of age and your health care provider tells you that you are at risk for this type of infection.  Your sexual activity has changed since you were last screened and you are at an increased risk for chlamydia  or gonorrhea. Ask your health care provider if you are at risk.  If you do not have HIV, but are at risk, it may be recommended that you take a prescription medicine daily to prevent HIV infection. This is called pre-exposure prophylaxis (PrEP). You are considered at risk if:  You are sexually active  and do not regularly use condoms or know the HIV status of your partner(s).  You take drugs by injection.  You are sexually active with a partner who has HIV. Talk with your health care provider about whether you are at high risk of being infected with HIV. If you choose to begin PrEP, you should first be tested for HIV. You should then be tested every 3 months for as long as you are taking PrEP.  PREGNANCY   If you are premenopausal and you may become pregnant, ask your health care provider about preconception counseling.  If you may become pregnant, take 400 to 800 micrograms (mcg) of folic acid every day.  If you want to prevent pregnancy, talk to your health care provider about birth control (contraception). OSTEOPOROSIS AND MENOPAUSE   Osteoporosis is a disease in which the bones lose minerals and strength with aging. This can result in serious bone fractures. Your risk for osteoporosis can be identified using a bone density scan.  If you are 49 years of age or older, or if you are at risk for osteoporosis and fractures, ask your health care provider if you should be screened.  Ask your health care provider whether you should take a calcium or vitamin D supplement to lower your risk for osteoporosis.  Menopause may have certain physical symptoms and risks.  Hormone replacement therapy may reduce some of these symptoms and risks. Talk to your health care provider about whether hormone replacement therapy is right for you.  HOME CARE INSTRUCTIONS   Schedule regular health, dental, and eye exams.  Stay current with your immunizations.   Do not use any tobacco products including  cigarettes, chewing tobacco, or electronic cigarettes.  If you are pregnant, do not drink alcohol.  If you are breastfeeding, limit how much and how often you drink alcohol.  Limit alcohol intake to no more than 1 drink per day for nonpregnant women. One drink equals 12 ounces of beer, 5 ounces of wine, or 1 ounces of hard liquor.  Do not use street drugs.  Do not share needles.  Ask your health care provider for help if you need support or information about quitting drugs.  Tell your health care provider if you often feel depressed.  Tell your health care provider if you have ever been abused or do not feel safe at home. Document Released: 12/25/2010 Document Revised: 10/26/2013 Document Reviewed: 05/13/2013 [facility] Patient Information 2015 [place], [place]. This information is not intended to replace advice given to you by your health care provider. Make sure you discuss any questions you have with your health care provider.

## 2014-12-09 NOTE — Progress Notes (Signed)
Pre-visit discussion using our clinic review tool. No additional management support is needed unless otherwise documented below in the visit note.  

## 2014-12-11 ENCOUNTER — Encounter: Payer: Self-pay | Admitting: Internal Medicine

## 2014-12-11 NOTE — Progress Notes (Signed)
Patient ID: Cynthia Houston Houston, female    DOB: 13-May-1966  Age: 49 y.o. MRN: [id number]  The patient is here for annual wellness examination and management of other chronic and acute problems.   The risk factors are reflected in the social history.  The roster of all physicians providing medical care to patient - is listed in the Snapshot section of the chart.  Activities of daily living:  The patient is 100% independent in all ADLs: dressing, toileting, feeding as well as independent mobility  Home safety : The patient has smoke detectors in the home. They wear seatbelts.  There are no firearms at home. There is no violence in the home.   There is no risks for hepatitis, STDs or HIV. There is no   history of blood transfusion. They have no travel history to infectious disease endemic areas of the world.  The patient has seen their dentist in the last six month. They have seen their eye doctor in the last year. They admit to slight hearing difficulty with regard to whispered voices and some television programs.  They have deferred audiologic testing in the last year.  They do not  have excessive sun exposure. Discussed the need for sun protection: hats, long sleeves and use of sunscreen if there is significant sun exposure.   Diet: the importance of a healthy diet is discussed. They do have a healthy diet.  The benefits of regular aerobic exercise were discussed. She walks 4 times per week ,  20 minutes.   Depression screen: there are no signs or vegative symptoms of depression- irritability, change in appetite, anhedonia, sadness/tearfullness.  Cognitive assessment: the patient manages all their financial and personal affairs and is actively engaged. They could relate day,date,year and events; recalled 2/3 objects at 3 minutes; performed clock-face test normally.  The following portions of the patient's history were reviewed and updated as appropriate: allergies, current medications, past family  history, past medical history,  past surgical history, past social history  and problem list.  Visual acuity was not assessed per patient preference since she has regular follow up with her ophthalmologist. Hearing and body mass index were assessed and reviewed.   During the course of the visit the patient was educated and counseled about appropriate screening and preventive services including : fall prevention , diabetes screening, nutrition counseling, colorectal cancer screening, and recommended immunizations.    CC: The primary encounter diagnosis was Colon cancer screening. Diagnoses of Breast cancer screening, Routine general medical examination at a health care facility, and Other constipation were also pertinent to this visit.  History Cynthia Houston has a past medical history of Chicken pox; Depression; Headache(784.0); Urinary tract infection; Tendinitis; and Diffuse cystic mastopathy (2013).   She has past surgical history that includes Breast biopsy; Appendectomy; Ectopic pregnancy surgery (1989); Uterine fibroid surgery (2002); Abdominal hysterectomy (2008); gynecological surgery  (2004); Diagnostic laparoscopy; Tubal ligation; Bunionectomy (Right, 2011); and Colonoscopy (2008).   Her family history includes Arthritis in her mother; Crohn's disease in her father; Heart disease (age of onset: 63) in her father; Hyperlipidemia in her father and mother; Hypertension in her father and mother.She reports that she has never smoked. She has never used smokeless tobacco. She reports that she does not drink alcohol or use illicit drugs.  Outpatient Prescriptions Prior to Visit  Medication Sig Dispense Refill  . ALPRAZolam (XANAX) 0.25 MG tablet Take 1 tablet (0.25 mg total) by mouth at bedtime as needed for sleep. (Patient not taking: Reported on  12/09/2014) 30 tablet 1  . cyanocobalamin 1000 MCG tablet Take 5,000 mcg by mouth daily.     Marland Kitchen estradiol (ESTRACE) 0.1 MG/GM vaginal cream Place 1  Applicatorful vaginally at bedtime. For 2 weeks,  Then twice weekly thereafter (Patient not taking: Reported on 12/09/2014) 42.5 g 1  . mirtazapine (REMERON) 15 MG tablet 1/2 tablet nightly for one week, then a full tablet nightly thereafter (Patient not taking: Reported on 12/09/2014) 30 tablet 2  . Multiple Vitamins-Minerals (MULTIVITAMIN & MINERAL PO) Take by mouth.     No facility-administered medications prior to visit.    Review of Systems   Patient denies headache, fevers, malaise, unintentional weight loss, skin rash, eye pain, sinus congestion and sinus pain, sore throat, dysphagia,  hemoptysis , cough, dyspnea, wheezing, chest pain, palpitations, orthopnea, edema, abdominal pain, nausea, melena, diarrhea, constipation, flank pain, dysuria, hematuria, urinary  Frequency, nocturia, numbness, tingling, seizures,  Focal weakness, Loss of consciousness,  Tremor, insomnia, depression, anxiety, and suicidal ideation.      Objective:  BP 102/70 mmHg  Pulse 73  Temp(Src) 98.2 F (36.8 C) (Oral)  Ht 5' 3.75" (1.619 m)  Wt 114 lb 8 oz (51.937 kg)  BMI 19.81 kg/m2  SpO2 99%  Physical Exam General appearance: alert, cooperative and appears stated age Head: Normocephalic, without obvious abnormality, atraumatic Eyes: conjunctivae/corneas clear. PERRL, EOM's intact. Fundi benign. Ears: normal TM's and external ear canals both ears Nose: Nares normal. Septum midline. Mucosa normal. No drainage or sinus tenderness. Throat: lips, mucosa, and tongue normal; teeth and gums normal Neck: no adenopathy, no carotid bruit, no JVD, supple, symmetrical, trachea midline and thyroid not enlarged, symmetric, no tenderness/mass/nodules Lungs: clear to auscultation bilaterally Breasts: normal appearance, no masses or tenderness Heart: regular rate and rhythm, S1, S2 normal, no murmur, click, rub or gallop Abdomen: soft, non-tender; bowel sounds normal; no masses,  no organomegaly Extremities: extremities  normal, atraumatic, no cyanosis or edema Pulses: 2+ and symmetric Skin: Skin color, texture, turgor normal. No rashes or lesions Neurologic: Alert and oriented X 3, normal strength and tone. Normal symmetric reflexes. Normal coordination and gait.   Assessment & Plan:   Problem List Items Addressed This Visit    Constipation    Advised patient to increase the fiber in her diet. she can take miralax, metamucil, fibercon, or citrucel daily to supplement her fiber. T      Routine general medical examination at a health care facility    Annual wellness  exam was done as well as a comprehensive physical exam  .  During the course of the visit the patient was educated and counseled about appropriate screening and preventive services and screenings were brought up to date for cervical and breast cancer .  She will return for fasting labs to provide samples for diabetes screening and lipid analysis with projected  10 year  risk for CAD. nutrition counseling, skin cancer screening has been recommended, along with review of the age appropriate recommended immunizations.  Printed recommendations for health maintenance screenings was given.         Other Visit Diagnoses    Colon cancer screening    -  Primary    Relevant Orders    Ambulatory referral to Gastroenterology    Breast cancer screening        Relevant Orders    MM DIGITAL SCREENING BILATERAL       I am having Ms. Houston start on ondansetron. I am also having her maintain her Multiple  Vitamins-Minerals (MULTIVITAMIN & MINERAL PO), cyanocobalamin, ALPRAZolam, estradiol, mirtazapine, and Biotin.  Meds ordered this encounter  Medications  . Biotin (BIOTIN MAXIMUM STRENGTH) 10 MG TABS    Sig: Take 1 tablet by mouth daily.  . ondansetron (ZOFRAN) 4 MG tablet    Sig: Take 1 tablet (4 mg total) by mouth every 8 (eight) hours as needed for nausea or vomiting.    Dispense:  20 tablet    Refill:  3    There are no discontinued  medications.  Follow-up: Return in about 1 year (around 12/09/2015).   Crecencio Mc, MD

## 2014-12-11 NOTE — Assessment & Plan Note (Signed)

## 2014-12-11 NOTE — Assessment & Plan Note (Signed)
Advised patient to increase the fiber in her diet. she can take miralax, metamucil, fibercon, or citrucel daily to supplement her fiber. T

## 2014-12-16 ENCOUNTER — Other Ambulatory Visit: Payer: Self-pay | Admitting: Internal Medicine

## 2014-12-16 ENCOUNTER — Encounter: Payer: Self-pay | Admitting: Internal Medicine

## 2014-12-16 DIAGNOSIS — I8390 Asymptomatic varicose veins of unspecified lower extremity: Secondary | ICD-10-CM

## 2014-12-17 LAB — HEPATIC FUNCTION PANEL
ALT: 23 U/L (ref 7–35)
AST: 22 U/L (ref 13–35)
Alkaline Phosphatase: 53 U/L (ref 25–125)
Bilirubin, Total: 0.3 mg/dL

## 2014-12-17 LAB — BASIC METABOLIC PANEL
BUN: 21 mg/dL (ref 4–21)
CREATININE: 0.8 mg/dL (ref 0.5–1.1)
GLUCOSE: 82 mg/dL
POTASSIUM: 4.3 mmol/L (ref 3.4–5.3)
Sodium: 140 mmol/L (ref 137–147)

## 2014-12-17 LAB — LIPID PANEL
CHOLESTEROL: 175 mg/dL (ref 0–200)
HDL: 74 mg/dL — AB (ref 35–70)
LDL Cholesterol: 93 mg/dL
Triglycerides: 42 mg/dL (ref 40–160)

## 2014-12-20 ENCOUNTER — Encounter: Payer: Self-pay | Admitting: Internal Medicine

## 2014-12-21 ENCOUNTER — Encounter: Payer: Self-pay | Admitting: Internal Medicine

## 2014-12-21 ENCOUNTER — Ambulatory Visit (INDEPENDENT_AMBULATORY_CARE_PROVIDER_SITE_OTHER): Payer: 59 | Admitting: Internal Medicine

## 2014-12-21 VITALS — BP 106/78 | HR 66 | Temp 98.3°F | Resp 14 | Ht 63.75 in | Wt 114.2 lb

## 2014-12-21 DIAGNOSIS — N3000 Acute cystitis without hematuria: Secondary | ICD-10-CM

## 2014-12-21 DIAGNOSIS — R300 Dysuria: Secondary | ICD-10-CM

## 2014-12-21 DIAGNOSIS — N390 Urinary tract infection, site not specified: Secondary | ICD-10-CM | POA: Insufficient documentation

## 2014-12-21 LAB — POCT URINALYSIS DIPSTICK
Bilirubin, UA: NEGATIVE
Glucose, UA: NEGATIVE
KETONES UA: NEGATIVE
Nitrite, UA: NEGATIVE
Protein, UA: NEGATIVE
RBC UA: NEGATIVE
SPEC GRAV UA: 1.015
UROBILINOGEN UA: 0.2
pH, UA: 7

## 2014-12-21 MED ORDER — CIPROFLOXACIN HCL 250 MG PO TABS: 250.0000 mg | ORAL_TABLET | Freq: Two times a day (BID) | ORAL | Status: DC

## 2014-12-21 NOTE — Progress Notes (Signed)
Subjective:  Patient ID: [name], female    DOB: 1965-06-28  Age: 49 y.o. MRN: [id number]  CC: The primary encounter diagnosis was Dysuria. A diagnosis of Acute cystitis without hematuria was also pertinent to this visit.  HPI MONIC ENGELMANN presents for urinary frequency  For several days followed by the development of  suprapubic cramping and mid lateral right sided back pain. Symptoms started one week ago after swimming in [place]. She has also noted greenish discharge. Has not been sexually active in several months .   Outpatient Prescriptions Prior to Visit  Medication Sig Dispense Refill  . ALPRAZolam (XANAX) 0.25 MG tablet Take 1 tablet (0.25 mg total) by mouth at bedtime as needed for sleep. 30 tablet 1  . Biotin (BIOTIN MAXIMUM STRENGTH) 10 MG TABS Take 1 tablet by mouth daily.    . Multiple Vitamins-Minerals (MULTIVITAMIN & MINERAL PO) Take by mouth.    . cyanocobalamin 1000 MCG tablet Take 5,000 mcg by mouth daily.     Marland Kitchen estradiol (ESTRACE) 0.1 MG/GM vaginal cream Place 1 Applicatorful vaginally at bedtime. For 2 weeks,  Then twice weekly thereafter (Patient not taking: Reported on 12/09/2014) 42.5 g 1  . mirtazapine (REMERON) 15 MG tablet 1/2 tablet nightly for one week, then a full tablet nightly thereafter (Patient not taking: Reported on 12/09/2014) 30 tablet 2  . ondansetron (ZOFRAN) 4 MG tablet Take 1 tablet (4 mg total) by mouth every 8 (eight) hours as needed for nausea or vomiting. (Patient not taking: Reported on 12/21/2014) 20 tablet 3   No facility-administered medications prior to visit.    Review of Systems;  Patient denies headache, fevers, malaise, unintentional weight loss, skin rash, eye pain, sinus congestion and sinus pain, sore throat, dysphagia,  hemoptysis , cough, dyspnea, wheezing, chest pain, palpitations, orthopnea, edema, abdominal pain, nausea, melena, diarrhea, constipation, flank pain, dysuria, hematuria, urinary  Frequency, nocturia, numbness,  tingling, seizures,  Focal weakness, Loss of consciousness,  Tremor, insomnia, depression, anxiety, and suicidal ideation.      Objective:  BP 106/78 mmHg  Pulse 66  Temp(Src) 98.3 F (36.8 C) (Oral)  Resp 14  Ht 5' 3.75" (1.619 m)  Wt 114 lb 4 oz (51.823 kg)  BMI 19.77 kg/m2  SpO2 99%  BP Readings from Last 3 Encounters:  12/21/14 106/78  12/09/14 102/70  05/18/14 120/72    Wt Readings from Last 3 Encounters:  12/21/14 114 lb 4 oz (51.823 kg)  12/09/14 114 lb 8 oz (51.937 kg)  05/18/14 114 lb (51.71 kg)    General Appearance:    Alert, cooperative, no distress, appears stated age     Chest Wall:    No tenderness or deformity   Heart:    Regular rate and rhythm, S1 and S2 normal, no murmur, rub   or gallop     Genitalia:    Pelvic: cervix normal in appearance, external genitalia normal, no adnexal masses or tenderness, no cervical motion tenderness, rectovaginal septum normal, uterus normal size, shape, and consistency and vagina normal without discharge        Assessment & Plan:   Problem List Items Addressed This Visit      Unprioritized   UTI (urinary tract infection)    Empiric Cipro pending urine culture results.  Pelvic exam was normal.        Other Visit Diagnoses    Dysuria    -  Primary    Relevant Orders    POCT Urinalysis Dipstick (Completed)  Urine Culture (Completed)    Urinalysis, Routine w reflex microscopic (Completed)       I am having Ms. Galik start on ciprofloxacin. I am also having her maintain her Multiple Vitamins-Minerals (MULTIVITAMIN & MINERAL PO), cyanocobalamin, ALPRAZolam, estradiol, mirtazapine, Biotin, and ondansetron.  Meds ordered this encounter  Medications  . ciprofloxacin (CIPRO) 250 MG tablet    Sig: Take 1 tablet (250 mg total) by mouth 2 (two) times daily.    Dispense:  14 tablet    Refill:  0    There are no discontinued medications.  Follow-up: No Follow-up on file.   Crecencio Mc, MD

## 2014-12-21 NOTE — Patient Instructions (Addendum)
I have sent Cipro to your pharmacy,  Please take it until you hear from me about your culture results.   Please take a probiotic ( Align, Floraque or Culturelle) of the generic version of one of these  For a minimum of 3 weeks to prevent a serious antibiotic associated diarrhea  Called clostridium dificile colitis    Urinary Tract Infection Urinary tract infections (UTIs) can develop anywhere along your urinary tract. Your urinary tract is your body's drainage system for removing wastes and extra water. Your urinary tract includes two kidneys, two ureters, a bladder, and a urethra. Your kidneys are a pair of bean-shaped organs. Each kidney is about the size of your fist. They are located below your ribs, one on each side of your spine. CAUSES Infections are caused by microbes, which are microscopic organisms, including fungi, viruses, and bacteria. These organisms are so small that they can only be seen through a microscope. Bacteria are the microbes that most commonly cause UTIs. SYMPTOMS  Symptoms of UTIs may vary by age and gender of the patient and by the location of the infection. Symptoms in young women typically include a frequent and intense urge to urinate and a painful, burning feeling in the bladder or urethra during urination. Older women and men are more likely to be tired, shaky, and weak and have muscle aches and abdominal pain. A fever may mean the infection is in your kidneys. Other symptoms of a kidney infection include pain in your back or sides below the ribs, nausea, and vomiting. DIAGNOSIS To diagnose a UTI, your caregiver will ask you about your symptoms. Your caregiver also will ask to provide a urine sample. The urine sample will be tested for bacteria and white blood cells. White blood cells are made by your body to help fight infection. TREATMENT  Typically, UTIs can be treated with medication. Because most UTIs are caused by a bacterial infection, they usually can be  treated with the use of antibiotics. The choice of antibiotic and length of treatment depend on your symptoms and the type of bacteria causing your infection. HOME CARE INSTRUCTIONS  If you were prescribed antibiotics, take them exactly as your caregiver instructs you. Finish the medication even if you feel better after you have only taken some of the medication.  Drink enough water and fluids to keep your urine clear or pale yellow.  Avoid caffeine, tea, and carbonated beverages. They tend to irritate your bladder.  Empty your bladder often. Avoid holding urine for long periods of time.  Empty your bladder before and after sexual intercourse.  After a bowel movement, women should cleanse from front to back. Use each tissue only once. SEEK MEDICAL CARE IF:   You have back pain.  You develop a fever.  Your symptoms do not begin to resolve within 3 days. SEEK IMMEDIATE MEDICAL CARE IF:   You have severe back pain or lower abdominal pain.  You develop chills.  You have nausea or vomiting.  You have continued burning or discomfort with urination. MAKE SURE YOU:   Understand these instructions.  Will watch your condition.  Will get help right away if you are not doing well or get worse. Document Released: 03/21/2005 Document Revised: 12/11/2011 Document Reviewed: 07/20/2011 [facility] Patient Information 2015 [place], [place]. This information is not intended to replace advice given to you by your health care provider. Make sure you discuss any questions you have with your health care provider. [id number]

## 2014-12-22 ENCOUNTER — Telehealth: Payer: Self-pay | Admitting: Internal Medicine

## 2014-12-22 LAB — URINALYSIS, ROUTINE W REFLEX MICROSCOPIC
Bilirubin Urine: NEGATIVE
Hgb urine dipstick: NEGATIVE
Ketones, ur: NEGATIVE
NITRITE: NEGATIVE
PH: 7 (ref 5.0–8.0)
RBC / HPF: NONE SEEN (ref 0–?)
Specific Gravity, Urine: 1.01 (ref 1.000–1.030)
Total Protein, Urine: NEGATIVE
Urine Glucose: NEGATIVE
Urobilinogen, UA: 0.2 (ref 0.0–1.0)

## 2014-12-23 NOTE — Assessment & Plan Note (Signed)
Empiric Cipro pending urine culture results.  Pelvic exam was normal.

## 2014-12-24 ENCOUNTER — Telehealth: Payer: Self-pay | Admitting: Internal Medicine

## 2014-12-24 ENCOUNTER — Ambulatory Visit
Admission: RE | Admit: 2014-12-24 | Discharge: 2014-12-24 | Disposition: A | Discharge status: Home / Self Care | Payer: 59 | Source: Ambulatory Visit | Attending: Internal Medicine | Admitting: Internal Medicine

## 2014-12-24 ENCOUNTER — Encounter: Payer: Self-pay | Admitting: Internal Medicine

## 2014-12-24 DIAGNOSIS — Z1239 Encounter for other screening for malignant neoplasm of breast: Secondary | ICD-10-CM

## 2014-12-24 LAB — URINE CULTURE

## 2014-12-24 NOTE — Telephone Encounter (Signed)
Called patient left message form needs patient signature in order for completion. Placed at front desk for patient to sign also sent my chart message.

## 2014-12-27 ENCOUNTER — Encounter: Payer: Self-pay | Admitting: Internal Medicine

## 2014-12-29 ENCOUNTER — Other Ambulatory Visit: Payer: Self-pay | Admitting: Internal Medicine

## 2014-12-29 MED ORDER — ESTROGENS, CONJUGATED 0.625 MG/GM VA CREA: TOPICAL_CREAM | VAGINAL | Status: DC

## 2015-01-06 LAB — TSH: TSH: 1.29 u[IU]/mL (ref 0.41–5.90)

## 2015-01-07 ENCOUNTER — Encounter: Payer: Self-pay | Admitting: Internal Medicine

## 2015-01-11 ENCOUNTER — Other Ambulatory Visit: Payer: Self-pay | Admitting: Internal Medicine

## 2015-01-11 ENCOUNTER — Telehealth: Payer: Self-pay | Admitting: Internal Medicine

## 2015-01-11 ENCOUNTER — Ambulatory Visit
Admission: RE | Admit: 2015-01-11 | Discharge: 2015-01-11 | Disposition: A | Discharge status: Home / Self Care | Payer: 59 | Source: Ambulatory Visit | Attending: Internal Medicine | Admitting: Internal Medicine

## 2015-01-11 DIAGNOSIS — Z1231 Encounter for screening mammogram for malignant neoplasm of breast: Secondary | ICD-10-CM | POA: Diagnosis present

## 2015-01-11 DIAGNOSIS — Z1239 Encounter for other screening for malignant neoplasm of breast: Secondary | ICD-10-CM

## 2015-01-11 NOTE — Telephone Encounter (Signed)
My Chart message sent

## 2015-01-12 ENCOUNTER — Encounter: Payer: Self-pay | Admitting: Internal Medicine

## 2015-02-01 ENCOUNTER — Encounter: Payer: Self-pay | Admitting: Internal Medicine

## 2015-03-08 LAB — HM COLONOSCOPY: HM COLON: NORMAL

## 2015-03-15 ENCOUNTER — Encounter: Payer: Self-pay | Admitting: Internal Medicine

## 2015-06-01 ENCOUNTER — Other Ambulatory Visit: Payer: Self-pay | Admitting: Internal Medicine

## 2015-06-10 ENCOUNTER — Encounter: Payer: Self-pay | Admitting: Family Medicine

## 2015-06-10 ENCOUNTER — Ambulatory Visit (INDEPENDENT_AMBULATORY_CARE_PROVIDER_SITE_OTHER): Payer: 59 | Admitting: Family Medicine

## 2015-06-10 DIAGNOSIS — M542 Cervicalgia: Secondary | ICD-10-CM | POA: Diagnosis not present

## 2015-06-10 DIAGNOSIS — M546 Pain in thoracic spine: Secondary | ICD-10-CM | POA: Diagnosis not present

## 2015-06-10 MED ORDER — CYCLOBENZAPRINE HCL 5 MG PO TABS: 5.0000 mg | ORAL_TABLET | Freq: Three times a day (TID) | ORAL | Status: DC | PRN

## 2015-06-10 NOTE — Patient Instructions (Addendum)
It was nice to see you today.  Take ibuprofen or aleve as needed for pain (you can take 2 aleve twice daily or 800 mg of Ibuprofen three times a day).  Use the flexeril at night to start.  No strenous activity.  Call if you have any concerns or do not improve.  Follow up:  Return if symptoms worsen or fail to improve.  Take care  Dr. Lacinda Axon
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
## 2015-06-10 NOTE — Progress Notes (Signed)
Pre visit review using our clinic review tool, if applicable. No additional management support is needed unless otherwise documented below in the visit note. 

## 2015-06-13 ENCOUNTER — Encounter: Payer: Self-pay | Admitting: Family Medicine

## 2015-06-13 ENCOUNTER — Ambulatory Visit (INDEPENDENT_AMBULATORY_CARE_PROVIDER_SITE_OTHER): Payer: 59 | Admitting: Family Medicine

## 2015-06-13 VITALS — BP 108/62 | HR 82 | Temp 98.1°F | Wt 117.0 lb

## 2015-06-13 DIAGNOSIS — R51 Headache: Secondary | ICD-10-CM | POA: Diagnosis not present

## 2015-06-13 DIAGNOSIS — M25512 Pain in left shoulder: Secondary | ICD-10-CM | POA: Insufficient documentation

## 2015-06-13 DIAGNOSIS — R519 Headache, unspecified: Secondary | ICD-10-CM | POA: Insufficient documentation

## 2015-06-13 MED ORDER — ALPRAZOLAM 0.25 MG PO TABS: 0.2500 mg | ORAL_TABLET | Freq: Two times a day (BID) | ORAL | Status: DC | PRN

## 2015-06-13 NOTE — Patient Instructions (Signed)
Your going to be fine.  Continue the Ibuprofen and flexeril as needed.  Follow up with Dr. Derrel Nip as needed.  Merry Christmas  Dr. Lacinda Axon
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
## 2015-06-13 NOTE — Progress Notes (Signed)
Pre visit review using our clinic review tool, if applicable. No additional management support is needed unless otherwise documented below in the visit note. 

## 2015-07-11 ENCOUNTER — Ambulatory Visit (INDEPENDENT_AMBULATORY_CARE_PROVIDER_SITE_OTHER): Payer: 59 | Admitting: Family Medicine

## 2015-07-11 ENCOUNTER — Encounter: Payer: Self-pay | Admitting: Family Medicine

## 2015-07-11 VITALS — BP 104/74 | HR 77 | Temp 97.9°F | Ht 63.75 in | Wt 119.6 lb

## 2015-07-11 DIAGNOSIS — M25512 Pain in left shoulder: Secondary | ICD-10-CM

## 2015-07-11 NOTE — Assessment & Plan Note (Signed)
Suspect soft tissue injury versus rotator cuff pathology versus biceps tendon pathology. Unlikely to be a torn rotator cuff or biceps tendon given exam. Given persistence we will refer her to physical therapy. Can continue as needed ibuprofen. Advised ice. Given return precautions.

## 2015-07-11 NOTE — Progress Notes (Signed)
Pre visit review using our clinic review tool, if applicable. No additional management support is needed unless otherwise documented below in the visit note. 
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
## 2015-07-14 ENCOUNTER — Encounter: Payer: Self-pay | Admitting: Internal Medicine

## 2015-07-15 MED ORDER — FLUCONAZOLE 150 MG PO TABS: 150.0000 mg | ORAL_TABLET | Freq: Every day | ORAL | Status: DC

## 2015-07-19 ENCOUNTER — Encounter: Payer: Self-pay | Admitting: Physical Therapy

## 2015-07-19 ENCOUNTER — Ambulatory Visit: Payer: 59 | Attending: Family Medicine | Admitting: Physical Therapy

## 2015-07-19 DIAGNOSIS — M62838 Other muscle spasm: Secondary | ICD-10-CM | POA: Insufficient documentation

## 2015-07-19 DIAGNOSIS — M25512 Pain in left shoulder: Secondary | ICD-10-CM | POA: Diagnosis present
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
## 2015-07-27 ENCOUNTER — Encounter: Payer: Self-pay | Admitting: Physical Therapy

## 2015-07-27 ENCOUNTER — Ambulatory Visit: Payer: 59 | Attending: Family Medicine | Admitting: Physical Therapy

## 2015-07-27 DIAGNOSIS — M25512 Pain in left shoulder: Secondary | ICD-10-CM | POA: Diagnosis present

## 2015-07-27 DIAGNOSIS — M6281 Muscle weakness (generalized): Secondary | ICD-10-CM | POA: Diagnosis present

## 2015-07-27 DIAGNOSIS — M62838 Other muscle spasm: Secondary | ICD-10-CM
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
## 2015-08-03 ENCOUNTER — Ambulatory Visit: Payer: 59 | Admitting: Physical Therapy

## 2015-08-03 ENCOUNTER — Encounter: Payer: Self-pay | Admitting: Physical Therapy

## 2015-08-03 DIAGNOSIS — M62838 Other muscle spasm: Secondary | ICD-10-CM

## 2015-08-03 DIAGNOSIS — M25512 Pain in left shoulder: Secondary | ICD-10-CM
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
## 2015-08-05 ENCOUNTER — Ambulatory Visit: Payer: 59 | Admitting: Physical Therapy

## 2015-08-05 ENCOUNTER — Encounter: Payer: Self-pay | Admitting: Physical Therapy

## 2015-08-05 DIAGNOSIS — M25512 Pain in left shoulder: Secondary | ICD-10-CM | POA: Diagnosis not present

## 2015-08-05 DIAGNOSIS — M62838 Other muscle spasm: Secondary | ICD-10-CM
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
## 2015-08-10 ENCOUNTER — Encounter: Payer: Self-pay | Admitting: Physical Therapy

## 2015-08-10 ENCOUNTER — Ambulatory Visit: Payer: 59 | Admitting: Physical Therapy

## 2015-08-10 DIAGNOSIS — M25512 Pain in left shoulder: Secondary | ICD-10-CM | POA: Diagnosis not present

## 2015-08-10 DIAGNOSIS — M62838 Other muscle spasm: Secondary | ICD-10-CM
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
## 2015-08-12 ENCOUNTER — Ambulatory Visit: Payer: 59 | Admitting: Physical Therapy

## 2015-08-12 ENCOUNTER — Encounter: Payer: Self-pay | Admitting: Physical Therapy

## 2015-08-12 DIAGNOSIS — M6281 Muscle weakness (generalized): Secondary | ICD-10-CM

## 2015-08-12 DIAGNOSIS — M25512 Pain in left shoulder: Secondary | ICD-10-CM | POA: Diagnosis not present

## 2015-08-12 DIAGNOSIS — M62838 Other muscle spasm: Secondary | ICD-10-CM
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
## 2015-08-16 ENCOUNTER — Encounter: Payer: Self-pay | Admitting: Physical Therapy

## 2015-08-16 ENCOUNTER — Encounter: Payer: 59 | Admitting: Physical Therapy

## 2015-08-16 ENCOUNTER — Ambulatory Visit: Payer: 59 | Admitting: Physical Therapy

## 2015-08-16 DIAGNOSIS — M25512 Pain in left shoulder: Secondary | ICD-10-CM

## 2015-08-16 DIAGNOSIS — M6281 Muscle weakness (generalized): Secondary | ICD-10-CM

## 2015-08-16 DIAGNOSIS — M62838 Other muscle spasm: Secondary | ICD-10-CM
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
## 2015-08-18 ENCOUNTER — Ambulatory Visit: Payer: 59 | Admitting: Physical Therapy

## 2015-08-18 ENCOUNTER — Encounter: Payer: Self-pay | Admitting: Physical Therapy

## 2015-08-18 DIAGNOSIS — M25512 Pain in left shoulder: Secondary | ICD-10-CM

## 2015-08-18 DIAGNOSIS — M6281 Muscle weakness (generalized): Secondary | ICD-10-CM

## 2015-08-18 DIAGNOSIS — M62838 Other muscle spasm: Secondary | ICD-10-CM
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
## 2015-08-22 ENCOUNTER — Encounter: Payer: 59 | Admitting: Physical Therapy

## 2015-08-22 ENCOUNTER — Ambulatory Visit: Payer: 59 | Admitting: Physical Therapy

## 2015-08-22 ENCOUNTER — Encounter: Payer: Self-pay | Admitting: Physical Therapy

## 2015-08-22 DIAGNOSIS — M6281 Muscle weakness (generalized): Secondary | ICD-10-CM

## 2015-08-22 DIAGNOSIS — M25512 Pain in left shoulder: Secondary | ICD-10-CM

## 2015-08-22 DIAGNOSIS — M62838 Other muscle spasm: Secondary | ICD-10-CM
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
## 2015-08-25 ENCOUNTER — Ambulatory Visit: Payer: 59 | Attending: Family Medicine | Admitting: Physical Therapy

## 2015-08-25 ENCOUNTER — Encounter: Payer: Self-pay | Admitting: Physical Therapy

## 2015-08-25 DIAGNOSIS — M25512 Pain in left shoulder: Secondary | ICD-10-CM | POA: Insufficient documentation

## 2015-08-25 DIAGNOSIS — M6281 Muscle weakness (generalized): Secondary | ICD-10-CM | POA: Insufficient documentation

## 2015-08-25 DIAGNOSIS — M62838 Other muscle spasm: Secondary | ICD-10-CM | POA: Insufficient documentation
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
## 2015-08-30 ENCOUNTER — Ambulatory Visit: Payer: 59 | Admitting: Physical Therapy

## 2015-08-30 ENCOUNTER — Encounter: Payer: Self-pay | Admitting: Physical Therapy

## 2015-08-30 DIAGNOSIS — M25512 Pain in left shoulder: Secondary | ICD-10-CM

## 2015-08-30 DIAGNOSIS — M6281 Muscle weakness (generalized): Secondary | ICD-10-CM | POA: Diagnosis not present

## 2015-08-30 DIAGNOSIS — M62838 Other muscle spasm: Secondary | ICD-10-CM
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
## 2015-10-30 ENCOUNTER — Encounter: Payer: Self-pay | Admitting: Internal Medicine

## 2015-10-31 ENCOUNTER — Other Ambulatory Visit: Payer: Self-pay | Admitting: Internal Medicine

## 2015-10-31 MED ORDER — ESTROGENS, CONJUGATED 0.625 MG/GM VA CREA: TOPICAL_CREAM | VAGINAL | Status: DC

## 2015-10-31 NOTE — Telephone Encounter (Signed)
REFILLED AND SENT

## 2015-10-31 NOTE — Telephone Encounter (Signed)
Medication was refilled 05/2015 with 2 refills. Last seen 06/2015 by Dr.Sonnenberg. Please advise?

## 2015-12-12 ENCOUNTER — Encounter: Payer: Self-pay | Admitting: Internal Medicine

## 2015-12-12 ENCOUNTER — Ambulatory Visit (INDEPENDENT_AMBULATORY_CARE_PROVIDER_SITE_OTHER): Payer: 59 | Admitting: Internal Medicine

## 2015-12-12 VITALS — BP 128/72 | HR 69 | Temp 98.1°F | Resp 12 | Ht 63.5 in | Wt 114.8 lb

## 2015-12-12 DIAGNOSIS — N6019 Diffuse cystic mastopathy of unspecified breast: Secondary | ICD-10-CM

## 2015-12-12 DIAGNOSIS — Z0000 Encounter for general adult medical examination without abnormal findings: Secondary | ICD-10-CM

## 2015-12-12 DIAGNOSIS — Z1239 Encounter for other screening for malignant neoplasm of breast: Secondary | ICD-10-CM

## 2015-12-12 DIAGNOSIS — Z8742 Personal history of other diseases of the female genital tract: Secondary | ICD-10-CM

## 2015-12-12 DIAGNOSIS — Z90711 Acquired absence of uterus with remaining cervical stump: Secondary | ICD-10-CM

## 2015-12-12 DIAGNOSIS — N941 Unspecified dyspareunia: Secondary | ICD-10-CM

## 2015-12-12 DIAGNOSIS — M25512 Pain in left shoulder: Secondary | ICD-10-CM | POA: Diagnosis not present

## 2015-12-12 DIAGNOSIS — R41840 Attention and concentration deficit: Secondary | ICD-10-CM

## 2015-12-12 NOTE — Patient Instructions (Signed)

## 2015-12-12 NOTE — Progress Notes (Signed)
Patient ID: Cynthia Houston, female    DOB: [birth year]  Age: 50 y.o. MRN: [id number]  The patient is here for annual well woman examination and management of other chronic and acute problems.   The risk factors are reflected in the social history.  The roster of all physicians providing medical care to patient - is listed in the Snapshot section of the chart.  Home safety : The patient has smoke detectors in the home. They wear seatbelts.  There are no firearms at home. There is no violence in the home.   There is no risks for hepatitis, STDs or HIV. There is no   history of blood transfusion. They have no travel history to infectious disease endemic areas of the world.  The patient has seen their dentist in the last six month. They have seen their eye doctor in the last year. They admit to slight hearing difficulty with regard to voices in a crowded noisy room .  She has a history of recurrent exposure to loud noises from going to live music events (rock concerts ) often in her 39's. She denies headaches and vertigo and defers audiologic testing at this time .  She has excessive sun exposure but wears makeup and lotion with 30 SPF at all times.   Discussed the need for sun protection: hats, long sleeves .  Diet: the importance of a healthy diet is discussed. They do have a healthy diet which includes eating fish at least twice weekly , fresh fruits and vegetables daily,  Avoidance of soft drinks and concentrated sugars, and does not drink alcohol .    The benefits of regular aerobic exercise were discussed. She works out at a [employer] 3 days per week using aerobic exercise and light weights for an hour.   Depression screen: see H & P chief complaint.    Cognitive assessment: the patient manages all their financial and personal affairs and is actively engaged. They could relate day,date,year and events; recalled 2/3 objects at 3 minutes; performed clock-face test normally.  The following  portions of the patient's history were reviewed and updated as appropriate: allergies, current medications, past family history, past medical history,  past surgical history, past social history  and problem list.  Visual acuity was not assessed per patient preference since she has regular follow up with her ophthalmologist. Hearing and body mass index were assessed and reviewed.   During the course of the visit the patient was educated and counseled about appropriate screening and preventive services including : fall prevention , diabetes screening, nutrition counseling, colorectal cancer screening, and recommended immunizations.    CC: The primary encounter diagnosis was Breast cancer screening. Diagnoses of Encounter for preventive health examination, S/P abdominal supracervical subtotal hysterectomy, Left shoulder pain, Fibrocystic breast disease, unspecified laterality, Dyspareunia, female, History of abnormal cervical Pap smear, and Concentration deficit were also pertinent to this visit.  decreased concentration. she has noted lack of concentration and difficulty following  complicated scenarios.  She feels this deficit both at home and at work.  She is never truly "not at work" because even on her days off she is expected to respond to Cynthia Houston and text messages from her boss.  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  She forgot to mention  that her father passed away in 10-21-2022, and she was very close to him.  She reports mild anhedonia, no trouble sleeping.   History Cynthia Houston has a past medical history of Chicken  pox; Depression; Headache(784.0); Urinary tract infection; Tendinitis; and Diffuse cystic mastopathy (2013).   She has past surgical history that includes Ectopic pregnancy surgery (1989); Uterine fibroid surgery (2002); Abdominal hysterectomy (2008); gynecological surgery  (2004); Diagnostic laparoscopy; Tubal ligation; Bunionectomy (Right, 2011); Colonoscopy (2008); and Breast cyst  aspiration (Right).   Her family history includes Arthritis in her mother; Crohn's disease in her father; Heart disease (age of onset: 63) in her father; Hyperlipidemia in her father and mother; Hypertension in her father and mother.She reports that she has never smoked. She has never used smokeless tobacco. She reports that she does not drink alcohol or use illicit drugs.  Outpatient Prescriptions Prior to Visit  Medication Sig Dispense Refill  . Biotin (BIOTIN MAXIMUM STRENGTH) 10 MG TABS Take 1 tablet by mouth daily.    Marland Kitchen conjugated estrogens (PREMARIN) vaginal cream APPLY 1 GRAM INTRAVAGINALLY NIGHTLY FOR 2 WEEKS, THEN TWICE WEEKLY THEREAFTER 30 g 2  . ALPRAZolam (XANAX) 0.25 MG tablet Take 1 tablet (0.25 mg total) by mouth 2 (two) times daily as needed for anxiety. (Patient not taking: Reported on 12/12/2015) 20 tablet 0  . cyclobenzaprine (FLEXERIL) 5 MG tablet Take 1 tablet (5 mg total) by mouth 3 (three) times daily as needed for muscle spasms. (Patient not taking: Reported on 12/12/2015) 30 tablet 0  . fluconazole (DIFLUCAN) 150 MG tablet Take 1 tablet (150 mg total) by mouth daily. (Patient not taking: Reported on 07/19/2015) 2 tablet 0   No facility-administered medications prior to visit.    Review of Systems   Patient denies headache, fevers, malaise, unintentional weight loss, skin rash, eye pain, sinus congestion and sinus pain, sore throat, dysphagia,  hemoptysis , cough, dyspnea, wheezing, chest pain, palpitations, orthopnea, edema, abdominal pain, nausea, melena, diarrhea, constipation, flank pain, dysuria, hematuria, urinary  Frequency, nocturia, numbness, tingling, seizures,  Focal weakness, Loss of consciousness,  Tremor, insomnia, depression, anxiety, and suicidal ideation.     Objective:  BP 128/72 mmHg  Pulse 69  Temp(Src) 98.1 F (36.7 C) (Oral)  Resp 12  Ht 5' 3.5" (1.613 m)  Wt 114 lb 12 oz (52.05 kg)  BMI 20.01 kg/m2  SpO2 99%  Physical Exam   General  appearance: alert, cooperative and appears stated age Head: Normocephalic, without obvious abnormality, atraumatic Eyes: conjunctivae/corneas clear. PERRL, EOM's intact. Fundi benign. Ears: normal TM's and external ear canals both ears Nose: Nares normal. Septum midline. Mucosa normal. No drainage or sinus tenderness. Throat: lips, mucosa, and tongue normal; teeth and gums normal Neck: no adenopathy, no carotid bruit, no JVD, supple, symmetrical, trachea midline and thyroid not enlarged, symmetric, no tenderness/mass/nodules Lungs: clear to auscultation bilaterally Breasts: normal appearance, no masses or tenderness Heart: regular rate and rhythm, S1, S2 normal, no murmur, click, rub or gallop Abdomen: soft, non-tender; bowel sounds normal; no masses,  no organomegaly Extremities: extremities normal, atraumatic, no cyanosis or edema Pulses: 2+ and symmetric Skin: Skin color, texture, turgor normal. No rashes or lesions Neurologic: Alert and oriented X 3, normal strength and tone. Normal symmetric reflexes. Normal coordination and gait.  Psych: affect normal, makes good eye contact. No fidgeting,  Smiles easily.  Denies suicidal thoughts    Assessment & Plan:   Problem List Items Addressed This Visit    History of abnormal cervical Pap smear    After ASCUS in 2012,  She had normal PAP smear in 2013, 2014 and 2015.  Repeat in 2018.      Fibrocystic breast disease    3d  mammogram ordered.  No masses on exam today      Encounter for preventive health examination    Annual comprehensive preventive exam was done as well as an evaluation and management of chronic conditions .  During the course of the visit the patient was educated and counseled about appropriate screening and preventive services including :  diabetes screening, lipid analysis with projected  10 year  risk for CAD , nutrition counseling, breast, cervical and colorectal cancer screening, and recommended immunizations.  Printed  recommendations for health maintenance screenings was given.       Dyspareunia, female    Managed with vaginal estrogen.  Discussed continued use long term as needed       S/P abdominal supracervical subtotal hysterectomy    S/p normal PAP smear 2015.       Left shoulder pain    Resolved with conservative treatment.       Concentration deficit    Reported by patient.  Stressors include recent loss of her father (April 2017) and the erosion of definitive boundaries between work and pleasure aided by use of cell phones and e mail. Advised her to address this issue with colleagues and supervisors and allow quiet personal time daily.  Will consider psychological counselling if persistent.        Other Visit Diagnoses    Breast cancer screening    -  Primary    Relevant Orders    MM DIGITAL SCREENING BILATERAL       I am having Ms. Broberg maintain her Biotin, cyclobenzaprine, ALPRAZolam, fluconazole, and conjugated estrogens.  No orders of the defined types were placed in this encounter.    There are no discontinued medications.  Follow-up: No Follow-up on file.   Crecencio Mc, MD

## 2015-12-12 NOTE — Progress Notes (Signed)
Pre-visit discussion using our clinic review tool. No additional management support is needed unless otherwise documented below in the visit note.  

## 2015-12-13 DIAGNOSIS — R41840 Attention and concentration deficit: Secondary | ICD-10-CM | POA: Insufficient documentation

## 2015-12-13 NOTE — Assessment & Plan Note (Signed)
3d mammogram ordered.  No masses on exam today

## 2015-12-13 NOTE — Assessment & Plan Note (Signed)
Managed with vaginal estrogen.  Discussed continued use long term as needed

## 2015-12-13 NOTE — Assessment & Plan Note (Signed)
Annual comprehensive preventive exam was done as well as an evaluation and management of chronic conditions .  During the course of the visit the patient was educated and counseled about appropriate screening and preventive services including :  diabetes screening, lipid analysis with projected  10 year  risk for CAD , nutrition counseling, breast, cervical and colorectal cancer screening, and recommended immunizations.  Printed recommendations for health maintenance screenings was given 

## 2015-12-13 NOTE — Assessment & Plan Note (Signed)
Resolved with conservative treatment. 

## 2015-12-13 NOTE — Assessment & Plan Note (Addendum)
Reported by patient.  Stressors include recent loss of her father (April 2017) and the erosion of definitive boundaries between work and pleasure aided by use of cell phones and e mail. Advised her to address this issue with colleagues and supervisors and allow quiet personal time daily.  Will consider psychological counselling if persistent.

## 2015-12-13 NOTE — Assessment & Plan Note (Signed)
S/p normal PAP smear 2015.

## 2015-12-13 NOTE — Assessment & Plan Note (Signed)
After ASCUS in 2012,  She had normal PAP smear in 2013, 2014 and 2015.  Repeat in 2018.

## 2015-12-15 ENCOUNTER — Other Ambulatory Visit: Payer: Self-pay | Admitting: Internal Medicine

## 2015-12-16 LAB — COMPREHENSIVE METABOLIC PANEL
ALK PHOS: 51 IU/L (ref 39–117)
ALT: 27 IU/L (ref 0–32)
AST: 26 IU/L (ref 0–40)
Albumin/Globulin Ratio: 1.6 (ref 1.2–2.2)
Albumin: 4.2 g/dL (ref 3.5–5.5)
BUN/Creatinine Ratio: 20 (ref 9–23)
BUN: 16 mg/dL (ref 6–24)
Bilirubin Total: 0.5 mg/dL (ref 0.0–1.2)
CALCIUM: 9.3 mg/dL (ref 8.7–10.2)
CO2: 21 mmol/L (ref 18–29)
CREATININE: 0.81 mg/dL (ref 0.57–1.00)
Chloride: 99 mmol/L (ref 96–106)
GFR calc Af Amer: 97 mL/min/{1.73_m2} (ref >59)
GFR, EST NON AFRICAN AMERICAN: 84 mL/min/{1.73_m2} (ref >59)
GLUCOSE: 93 mg/dL (ref 65–99)
Globulin, Total: 2.6 g/dL (ref 1.5–4.5)
Potassium: 4.5 mmol/L (ref 3.5–5.2)
Sodium: 138 mmol/L (ref 134–144)
Total Protein: 6.8 g/dL (ref 6.0–8.5)

## 2015-12-16 LAB — CBC WITH DIFFERENTIAL/PLATELET
BASOS ABS: 0 10*3/uL (ref 0.0–0.2)
Basos: 1 %
EOS (ABSOLUTE): 0.2 10*3/uL (ref 0.0–0.4)
Eos: 5 %
Hematocrit: 40.8 % (ref 34.0–46.6)
Hemoglobin: 14.1 g/dL (ref 11.1–15.9)
Immature Grans (Abs): 0 10*3/uL (ref 0.0–0.1)
Immature Granulocytes: 0 %
LYMPHS ABS: 1.7 10*3/uL (ref 0.7–3.1)
LYMPHS: 41 %
MCH: 30.2 pg (ref 26.6–33.0)
MCHC: 34.6 g/dL (ref 31.5–35.7)
MCV: 87 fL (ref 79–97)
MONOS ABS: 0.4 10*3/uL (ref 0.1–0.9)
Monocytes: 10 %
NEUTROS ABS: 1.8 10*3/uL (ref 1.4–7.0)
Neutrophils: 43 %
PLATELETS: 217 10*3/uL (ref 150–379)
RBC: 4.67 x10E6/uL (ref 3.77–5.28)
RDW: 14.4 % (ref 12.3–15.4)
WBC: 4.1 10*3/uL (ref 3.4–10.8)

## 2015-12-16 LAB — LIPID PANEL W/O CHOL/HDL RATIO
CHOLESTEROL TOTAL: 184 mg/dL (ref 100–199)
HDL: 78 mg/dL (ref >39)
LDL CALC: 96 mg/dL (ref 0–99)
TRIGLYCERIDES: 50 mg/dL (ref 0–149)
VLDL Cholesterol Cal: 10 mg/dL (ref 5–40)

## 2015-12-16 LAB — HIV ANTIBODY (ROUTINE TESTING W REFLEX): HIV Screen 4th Generation wRfx: NONREACTIVE

## 2015-12-16 LAB — HCV COMMENT:

## 2015-12-16 LAB — HEPATITIS C ANTIBODY (REFLEX): HCV Ab: <0.1 s/co ratio (ref 0.0–0.9)

## 2015-12-16 LAB — TSH: TSH: 1.66 u[IU]/mL (ref 0.450–4.500)

## 2015-12-18 ENCOUNTER — Encounter: Payer: Self-pay | Admitting: Internal Medicine

## 2015-12-20 ENCOUNTER — Encounter: Payer: Self-pay | Admitting: Internal Medicine

## 2015-12-21 ENCOUNTER — Encounter: Payer: Self-pay | Admitting: Internal Medicine

## 2015-12-22 NOTE — Telephone Encounter (Signed)
Labs are in Epic that why I did not abstract but we do not have an A1c. I ask patient if she has to have with normal glucose awaiting answer.

## 2016-01-02 ENCOUNTER — Telehealth: Payer: Self-pay | Admitting: *Deleted

## 2016-01-02 ENCOUNTER — Ambulatory Visit (INDEPENDENT_AMBULATORY_CARE_PROVIDER_SITE_OTHER): Payer: 59 | Admitting: Internal Medicine

## 2016-01-02 VITALS — BP 110/76 | HR 62 | Temp 97.8°F | Resp 14 | Ht 63.5 in | Wt 114.8 lb

## 2016-01-02 DIAGNOSIS — R1033 Periumbilical pain: Secondary | ICD-10-CM | POA: Diagnosis not present

## 2016-01-02 DIAGNOSIS — F411 Generalized anxiety disorder: Secondary | ICD-10-CM | POA: Diagnosis not present

## 2016-01-02 MED ORDER — SERTRALINE HCL 25 MG PO TABS: 25.0000 mg | ORAL_TABLET | Freq: Every day | ORAL | Status: DC

## 2016-01-02 NOTE — Patient Instructions (Signed)
Start the generic zoloft after dinner 25 mg daily..  increase to 50 mg daily after one week if you are tolerating medication without  nausea   If your abdominal pain worsens, or if you start having fevers or diarrhea,  Call and I will arrange to have the CT done

## 2016-01-02 NOTE — Telephone Encounter (Signed)
Pt would like to see Dr. Derrel Nip this week, she has had stomach pain for a week. Please give a time and date on Dr. Derrel Nip schedule  Pt contact [phone]

## 2016-01-02 NOTE — Progress Notes (Signed)
Subjective:  Patient ID: Cynthia Houston, female    DOB: 05/27/1966  Age: 50 y.o. MRN: [id number]  CC: There were no encounter diagnoses.  HPI Cynthia Houston presents for evaluation of abdominal pain  Ptient has been having episodes of left sided periumbilical pain that is stabbing in quality and brought on by motions including twisting and bending over.  It has been recurrent for the past 2 weeks. It is lLocalized to the left side of umbilicus.  It did not improve with trial of zantac.  It is not alleviated or aggravated  by eating or moving her bowels. Marland Kitchen at times very severe 8/10 aggravated by movement.  She exercises regularly bu has not done so in  several weeks due to work conflicts and recent vacation,  And she denies any unusual activity.  No new medications.  Denis nausea, fevers,  Urinary frequency,  Hematuria and notes an improvement in her chronic constipation. Marland Kitchen     Anxiety.  Since her annual exam in May she has been feeling the need to constantly stay busy and is easily  overhwelmed by small tasks.  She was unable to relax during her  vacation last week, but was less constipated.   She grieves the recent death of her father but denies feelings of depression. She has no prior trial of SSRI's.  She does reports several obsessive tendencies that make it hard for her to relax and move to the next task but does not have any ritualistic behavior.        Outpatient Prescriptions Prior to Visit  Medication Sig Dispense Refill  . ALPRAZolam (XANAX) 0.25 MG tablet Take 1 tablet (0.25 mg total) by mouth 2 (two) times daily as needed for anxiety. 20 tablet 0  . Biotin (BIOTIN MAXIMUM STRENGTH) 10 MG TABS Take 1 tablet by mouth daily.    Marland Kitchen conjugated estrogens (PREMARIN) vaginal cream APPLY 1 GRAM INTRAVAGINALLY NIGHTLY FOR 2 WEEKS, THEN TWICE WEEKLY THEREAFTER 30 g 2  . cyclobenzaprine (FLEXERIL) 5 MG tablet Take 1 tablet (5 mg total) by mouth 3 (three) times daily as needed for muscle spasms.  30 tablet 0  . fluconazole (DIFLUCAN) 150 MG tablet Take 1 tablet (150 mg total) by mouth daily. 2 tablet 0   No facility-administered medications prior to visit.    Review of Systems;  Patient denies headache, fevers, malaise, unintentional weight loss, skin rash, eye pain, sinus congestion and sinus pain, sore throat, dysphagia,  hemoptysis , cough, dyspnea, wheezing, chest pain, palpitations, orthopnea, edema, abdominal pain, nausea, melena, diarrhea, constipation, flank pain, dysuria, hematuria, urinary  Frequency, nocturia, numbness, tingling, seizures,  Focal weakness, Loss of consciousness,  Tremor, insomnia, depression, anxiety, and suicidal ideation.      Objective:  BP 110/76 mmHg  Pulse 62  Temp(Src) 97.8 F (36.6 C) (Oral)  Resp 14  Ht 5' 3.5" (1.613 m)  Wt 114 lb 12.8 oz (52.073 kg)  BMI 20.01 kg/m2  SpO2 97%  BP Readings from Last 3 Encounters:  01/02/16 110/76  12/12/15 128/72  07/11/15 104/74    Wt Readings from Last 3 Encounters:  01/02/16 114 lb 12.8 oz (52.073 kg)  12/12/15 114 lb 12 oz (52.05 kg)  07/11/15 119 lb 9.6 oz (54.25 kg)    General appearance: alert, cooperative and appears stated age Back: symmetric, no curvature. ROM normal. No CVA tenderness. Lungs: clear to auscultation bilaterally Heart: regular rate and rhythm, S1, S2 normal, no murmur, click, rub or gallop Abdomen: soft,  tender to deep palpation bilaterally at the lateral borders of the rectus abdominus muscles just inferior to the umbilicus Pulses: 2+ and symmetric Skin: Skin color, texture, turgor normal. No rashes or lesions Lymph nodes: Cervical, supraclavicular, and axillary nodes normal.  Lab Results  Component Value Date   HGBA1C 5.9 05/08/2014   HGBA1C 5.7* 11/25/2013    Lab Results  Component Value Date   CREATININE 0.81 12/15/2015   CREATININE 0.8 12/17/2014   CREATININE 0.8 05/08/2014    Lab Results  Component Value Date   WBC 4.1 12/15/2015   HGB 13.7  05/08/2014   HCT 40.8 12/15/2015   PLT 217 12/15/2015   GLUCOSE 93 12/15/2015   CHOL 184 12/15/2015   TRIG 50 12/15/2015   HDL 78 12/15/2015   LDLCALC 96 12/15/2015   ALT 27 12/15/2015   AST 26 12/15/2015   NA 138 12/15/2015   K 4.5 12/15/2015   CL 99 12/15/2015   CREATININE 0.81 12/15/2015   BUN 16 12/15/2015   CO2 21 12/15/2015   TSH 1.660 12/15/2015   HGBA1C 5.9 05/08/2014    No results found.  Assessment & Plan:   Problem List Items Addressed This Visit    None      I am having Cynthia Houston maintain her Biotin, cyclobenzaprine, ALPRAZolam, fluconazole, and conjugated estrogens.  No orders of the defined types were placed in this encounter.    There are no discontinued medications.  Follow-up: No Follow-up on file.   Crecencio Mc, MD

## 2016-01-02 NOTE — Progress Notes (Signed)
Pre visit review using our clinic review tool, if applicable. No additional management support is needed unless otherwise documented below in the visit note. 

## 2016-01-02 NOTE — Telephone Encounter (Signed)
Started last week, left sided stomach pain midline at belly button, sharp pains that are intermittent , more frequent this week, took zantac last week thinking it may be reflux/ulcer, didn't seem to help.  No vomiting, diarrhea, able to have BM's, no urinating issues.  No dietary changes.    Pain 7-8/10 when it happens, lasts for a few seconds.  Bending over or laying down makes it worse when it is happening.   Please advise?

## 2016-01-02 NOTE — Telephone Encounter (Signed)
Please add to my scheulde this week

## 2016-01-02 NOTE — Telephone Encounter (Signed)
Patient scheduled 6 today.

## 2016-01-03 DIAGNOSIS — F411 Generalized anxiety disorder: Secondary | ICD-10-CM | POA: Insufficient documentation

## 2016-01-03 DIAGNOSIS — R1033 Periumbilical pain: Secondary | ICD-10-CM | POA: Insufficient documentation

## 2016-01-03 NOTE — Assessment & Plan Note (Signed)
Patient agrees to trial of SSRI.  Choosing generic zoloft starting at 25 mg daily for week 1,  Increase to 50 mg week 2,  Return in one month.

## 2016-01-03 NOTE — Assessment & Plan Note (Signed)
Her pain is positional, bilateral, and unaccompanied by any signs of symptoms of appendicitis, diverticulitis or renal calculi. Reassurance provided that her exam is consistent with muscle strain, but she remains anxious that something is seriously wrong . Checking ESR, CRP. CBC , CMET and UA.  If all normal,  No further workup.

## 2016-01-04 ENCOUNTER — Encounter: Payer: Self-pay | Admitting: Internal Medicine

## 2016-01-04 LAB — CBC WITH DIFFERENTIAL/PLATELET
Basophils Absolute: 0 10*3/uL (ref 0.0–0.2)
Basos: 0 %
EOS (ABSOLUTE): 0.2 10*3/uL (ref 0.0–0.4)
Eos: 4 %
Hematocrit: 40.1 % (ref 34.0–46.6)
Hemoglobin: 13.8 g/dL (ref 11.1–15.9)
Immature Grans (Abs): 0 10*3/uL (ref 0.0–0.1)
Immature Granulocytes: 0 %
Lymphocytes Absolute: 2 10*3/uL (ref 0.7–3.1)
Lymphs: 43 %
MCH: 30.1 pg (ref 26.6–33.0)
MCHC: 34.4 g/dL (ref 31.5–35.7)
MCV: 87 fL (ref 79–97)
Monocytes Absolute: 0.5 10*3/uL (ref 0.1–0.9)
Monocytes: 10 %
Neutrophils Absolute: 2 10*3/uL (ref 1.4–7.0)
Neutrophils: 43 %
Platelets: 240 10*3/uL (ref 150–379)
RBC: 4.59 x10E6/uL (ref 3.77–5.28)
RDW: 14.7 % (ref 12.3–15.4)
WBC: 4.6 10*3/uL (ref 3.4–10.8)

## 2016-01-04 LAB — URINALYSIS, ROUTINE W REFLEX MICROSCOPIC
BILIRUBIN UA: NEGATIVE
GLUCOSE, UA: NEGATIVE
Ketones, UA: NEGATIVE
LEUKOCYTES UA: NEGATIVE
Nitrite, UA: NEGATIVE
PROTEIN UA: NEGATIVE
RBC, UA: NEGATIVE
Specific Gravity, UA: 1.01 (ref 1.005–1.030)
UUROB: 0.2 mg/dL (ref 0.2–1.0)
pH, UA: 6.5 (ref 5.0–7.5)

## 2016-01-04 LAB — LIPASE: Lipase: 45 U/L (ref 0–59)

## 2016-01-04 LAB — C-REACTIVE PROTEIN: CRP: <0.3 mg/L (ref 0.0–4.9)

## 2016-01-04 LAB — SEDIMENTATION RATE: SED RATE: 2 mm/h (ref 0–40)

## 2016-01-10 ENCOUNTER — Other Ambulatory Visit: Payer: Self-pay | Admitting: Internal Medicine

## 2016-01-10 ENCOUNTER — Encounter: Payer: Self-pay | Admitting: Internal Medicine

## 2016-01-10 DIAGNOSIS — R1033 Periumbilical pain: Secondary | ICD-10-CM

## 2016-01-16 NOTE — Telephone Encounter (Signed)
My Chart message sent

## 2016-01-17 ENCOUNTER — Ambulatory Visit (INDEPENDENT_AMBULATORY_CARE_PROVIDER_SITE_OTHER): Payer: 59 | Admitting: Internal Medicine

## 2016-01-17 ENCOUNTER — Encounter: Payer: Self-pay | Admitting: Internal Medicine

## 2016-01-17 ENCOUNTER — Ambulatory Visit
Admission: RE | Admit: 2016-01-17 | Discharge: 2016-01-17 | Disposition: A | Discharge status: Home / Self Care | Payer: 59 | Source: Ambulatory Visit | Attending: Internal Medicine | Admitting: Internal Medicine

## 2016-01-17 VITALS — BP 118/78 | HR 60 | Temp 98.1°F | Resp 16 | Wt 115.0 lb

## 2016-01-17 DIAGNOSIS — Z1239 Encounter for other screening for malignant neoplasm of breast: Secondary | ICD-10-CM

## 2016-01-17 DIAGNOSIS — R1033 Periumbilical pain: Secondary | ICD-10-CM | POA: Diagnosis not present

## 2016-01-17 DIAGNOSIS — F411 Generalized anxiety disorder: Secondary | ICD-10-CM | POA: Diagnosis not present

## 2016-01-17 MED ORDER — SERTRALINE HCL 50 MG PO TABS: 50.0000 mg | ORAL_TABLET | Freq: Every day | ORAL | 5 refills | Status: DC

## 2016-01-17 NOTE — Patient Instructions (Signed)
I have increased the ZOloft to 50 mg.  Wait until the weekend to increase the dose  I have ordered the CT of abdomen and pelvis to investigate the recurrent abdominal pain you are having   In the meantime  Try taking 2 Beano tablets before any meal containing broccoli, cauliflower,  Brussels sprouts or legumes . This may reduce the amount of gas they produce

## 2016-01-17 NOTE — Progress Notes (Signed)
Subjective:  Patient ID: Cynthia Houston, female    DOB: 04/23/1966  Age: 50 y.o. MRN: [id number]  CC: The primary encounter diagnosis was Colicky periumbilical abdominal pain. Diagnoses of Generalized anxiety disorder and Periumbilical abdominal pain were also pertinent to this visit.  HPI Cynthia Houston presents for follow up on anxiety disorder and abdominal pain.  Her anxiety is better controlled with initiation of of zoloft..  She is tolerating the sedating effects of zoloft and would like to increase the dose to 50 mg daily.   She continues to have episodes os severe periumbilical abdominal pain , usually on he left side, lasting up to an hour.  No fevers,  Change in stools .  No unintentional weight loss,  Not working out since the abdominal pain started, about 6 weeks ago.    Outpatient Medications Prior to Visit  Medication Sig Dispense Refill  . Biotin (BIOTIN MAXIMUM STRENGTH) 10 MG TABS Take 1 tablet by mouth daily.    Marland Kitchen conjugated estrogens (PREMARIN) vaginal cream APPLY 1 GRAM INTRAVAGINALLY NIGHTLY FOR 2 WEEKS, THEN TWICE WEEKLY THEREAFTER 30 g 2  . sertraline (ZOLOFT) 25 MG tablet Take 1 tablet (25 mg total) by mouth daily. 90 tablet 1  . ALPRAZolam (XANAX) 0.25 MG tablet Take 1 tablet (0.25 mg total) by mouth 2 (two) times daily as needed for anxiety. (Patient not taking: Reported on 01/17/2016) 20 tablet 0  . cyclobenzaprine (FLEXERIL) 5 MG tablet Take 1 tablet (5 mg total) by mouth 3 (three) times daily as needed for muscle spasms. 30 tablet 0  . fluconazole (DIFLUCAN) 150 MG tablet Take 1 tablet (150 mg total) by mouth daily. 2 tablet 0   No facility-administered medications prior to visit.     Review of Systems;  Patient denies headache, fevers, malaise, unintentional weight loss, skin rash, eye pain, sinus congestion and sinus pain, sore throat, dysphagia,  hemoptysis , cough, dyspnea, wheezing, chest pain, palpitations, orthopnea, edema, abdominal pain, nausea,  melena, diarrhea, constipation, flank pain, dysuria, hematuria, urinary  Frequency, nocturia, numbness, tingling, seizures,  Focal weakness, Loss of consciousness,  Tremor, insomnia, depression, anxiety, and suicidal ideation.      Objective:  BP 118/78 (BP Location: Right Arm, Patient Position: Sitting, Cuff Size: Normal)   Pulse 60   Temp 98.1 F (36.7 C) (Oral)   Resp 16   Wt 115 lb (52.2 kg)   BMI 20.05 kg/m   BP Readings from Last 3 Encounters:  01/17/16 118/78  01/02/16 110/76  12/12/15 128/72    Wt Readings from Last 3 Encounters:  01/17/16 115 lb (52.2 kg)  01/02/16 114 lb 12.8 oz (52.1 kg)  12/12/15 114 lb 12 oz (52.1 kg)    General appearance: alert, cooperative and appears stated age Ears: normal TM's and external ear canals both ears Throat: lips, mucosa, and tongue normal; teeth and gums normal Neck: no adenopathy, no carotid bruit, supple, symmetrical, trachea midline and thyroid not enlarged, symmetric, no tenderness/mass/nodules Back: symmetric, no curvature. ROM normal. No CVA tenderness. Lungs: clear to auscultation bilaterally Heart: regular rate and rhythm, S1, S2 normal, no murmur, click, rub or gallop Abdomen: soft, non-tender; bowel sounds normal; no masses,  no organomegaly Pulses: 2+ and symmetric Skin: Skin color, texture, turgor normal. No rashes or lesions Lymph nodes: Cervical, supraclavicular, and axillary nodes normal.  Lab Results  Component Value Date   HGBA1C 5.5 01/03/2016   HGBA1C 5.9 05/08/2014   HGBA1C 5.7 (H) 11/25/2013    Lab Results  Component Value Date   CREATININE 0.81 12/15/2015   CREATININE 0.8 12/17/2014   CREATININE 0.8 05/08/2014    Lab Results  Component Value Date   WBC 4.6 01/03/2016   HGB 13.7 05/08/2014   HCT 40.1 01/03/2016   PLT 240 01/03/2016   GLUCOSE 93 12/15/2015   CHOL 184 12/15/2015   TRIG 50 12/15/2015   HDL 78 12/15/2015   LDLCALC 96 12/15/2015   ALT 27 12/15/2015   AST 26 12/15/2015   NA  138 12/15/2015   K 4.5 12/15/2015   CL 99 12/15/2015   CREATININE 0.81 12/15/2015   BUN 16 12/15/2015   CO2 21 12/15/2015   TSH 1.660 12/15/2015   HGBA1C 5.5 01/03/2016    No results found.  Assessment & Plan:   Problem List Items Addressed This Visit    Periumbilical abdominal pain    Etiology may be chronic appendicitis since pelvic exam was normal at last visit. No signs of renal calculi by UA.   CT abd and pelvis ordered.       Generalized anxiety disorder    Improved with zoloft,  Increasing dose to 50 mg daily        Other Visit Diagnoses    Colicky periumbilical abdominal pain    -  Primary   Relevant Orders   CT Abdomen Pelvis W Contrast      I have discontinued Cynthia Houston's cyclobenzaprine and fluconazole. I have also changed her sertraline. Additionally, I am having her maintain her Biotin, ALPRAZolam, and conjugated estrogens.  Meds ordered this encounter  Medications  . sertraline (ZOLOFT) 50 MG tablet    Sig: Take 1 tablet (50 mg total) by mouth daily.    Dispense:  30 tablet    Refill:  5    Medications Discontinued During This Encounter  Medication Reason  . cyclobenzaprine (FLEXERIL) 5 MG tablet No longer needed (for PRN medications)  . fluconazole (DIFLUCAN) 150 MG tablet Completed Course  . sertraline (ZOLOFT) 25 MG tablet Reorder    Follow-up: No Follow-up on file.   Crecencio Mc, MD

## 2016-01-18 LAB — SPECIMEN STATUS REPORT

## 2016-01-18 NOTE — Assessment & Plan Note (Signed)
Improved with zoloft,  Increasing dose to 50 mg daily

## 2016-01-18 NOTE — Assessment & Plan Note (Addendum)
Etiology may be chronic appendicitis since pelvic exam was normal at last visit. No signs of renal calculi by UA.   CT abd and pelvis ordered.

## 2016-01-20 ENCOUNTER — Encounter: Payer: Self-pay | Admitting: Internal Medicine

## 2016-01-26 LAB — HGB A1C W/O EAG: HEMOGLOBIN A1C: 5.5 % (ref 4.8–5.6)

## 2016-01-26 LAB — SPECIMEN STATUS REPORT

## 2016-01-27 ENCOUNTER — Ambulatory Visit: Payer: 59

## 2016-01-31 ENCOUNTER — Other Ambulatory Visit: Payer: Self-pay | Admitting: Internal Medicine

## 2016-01-31 ENCOUNTER — Ambulatory Visit
Admission: RE | Admit: 2016-01-31 | Discharge: 2016-01-31 | Disposition: A | Discharge status: Home / Self Care | Payer: 59 | Source: Ambulatory Visit | Attending: Internal Medicine | Admitting: Internal Medicine

## 2016-01-31 DIAGNOSIS — Z1231 Encounter for screening mammogram for malignant neoplasm of breast: Secondary | ICD-10-CM | POA: Diagnosis present

## 2016-01-31 DIAGNOSIS — Z1239 Encounter for other screening for malignant neoplasm of breast: Secondary | ICD-10-CM

## 2016-02-01 ENCOUNTER — Ambulatory Visit
Admission: RE | Admit: 2016-02-01 | Discharge: 2016-02-01 | Disposition: A | Discharge status: Home / Self Care | Payer: 59 | Source: Ambulatory Visit | Attending: Internal Medicine | Admitting: Internal Medicine

## 2016-02-01 DIAGNOSIS — R1033 Periumbilical pain: Secondary | ICD-10-CM

## 2016-02-01 MED ORDER — IOPAMIDOL (ISOVUE-300) INJECTION 61%
80.0000 mL | Freq: Once | INTRAVENOUS | Status: AC | PRN
  Administered 2016-02-01: 80 mL via INTRAVENOUS

## 2016-02-02 ENCOUNTER — Encounter: Payer: Self-pay | Admitting: Internal Medicine

## 2016-06-20 ENCOUNTER — Other Ambulatory Visit: Payer: Self-pay | Admitting: Internal Medicine

## 2016-07-04 ENCOUNTER — Encounter: Payer: Self-pay | Admitting: Family

## 2016-07-04 ENCOUNTER — Ambulatory Visit (INDEPENDENT_AMBULATORY_CARE_PROVIDER_SITE_OTHER): Payer: 59 | Admitting: Family

## 2016-07-04 VITALS — BP 112/62 | HR 79 | Temp 99.0°F | Ht 63.5 in | Wt 119.0 lb

## 2016-07-04 DIAGNOSIS — J111 Influenza due to unidentified influenza virus with other respiratory manifestations: Secondary | ICD-10-CM

## 2016-07-04 LAB — POCT INFLUENZA A/B
Influenza A, POC: POSITIVE — AB
Influenza B, POC: POSITIVE — AB

## 2016-07-04 MED ORDER — HYDROCOD POLST-CPM POLST ER 10-8 MG/5ML PO SUER: 5.0000 mL | Freq: Every evening | ORAL | 0 refills | Status: DC | PRN

## 2016-07-04 MED ORDER — OSELTAMIVIR PHOSPHATE 75 MG PO CAPS: 75.0000 mg | ORAL_CAPSULE | Freq: Two times a day (BID) | ORAL | 0 refills | Status: DC

## 2016-07-04 NOTE — Patient Instructions (Signed)
Start tamiflu.  Mucinex  ( plain) to break up congestion  Please take cough medication at night only as needed. As we discussed, I do not recommend dosing throughout the day as coughing is a protective mechanism . It also helps to break up thick mucous.  Do not take cough suppressants with alcohol as can lead to trouble breathing. Advise caution if taking cough suppressant and operating machinery ( i.e driving a car) as you may feel very tired.    Influenza, Adult Influenza, more commonly known as "the flu," is a viral infection that primarily affects the respiratory tract. The respiratory tract includes organs that help you breathe, such as the lungs, nose, and throat. The flu causes many common cold symptoms, as well as a high fever and body aches. The flu spreads easily from person to person (is contagious). Getting a flu shot (influenza vaccination) every year is the best way to prevent influenza. What are the causes? Influenza is caused by a virus. You can catch the virus by:  Breathing in droplets from an infected person's cough or sneeze.  Touching something that was recently contaminated with the virus and then touching your mouth, nose, or eyes. What increases the risk? The following factors may make you more likely to get the flu:  Not cleaning your hands frequently with soap and water or alcohol-based hand sanitizer.  Having close contact with many people during cold and flu season.  Touching your mouth, eyes, or nose without washing or sanitizing your hands first.  Not drinking enough fluids or not eating a healthy diet.  Not getting enough sleep or exercise.  Being under a high amount of stress.  Not getting a yearly (annual) flu shot. You may be at a higher risk of complications from the flu, such as a severe lung infection (pneumonia), if you:  Are over the age of 7.  Are pregnant.  Have a weakened disease-fighting system (immune system). You may have a weakened  immune system if you:  Have HIV or AIDS.  Are undergoing chemotherapy.  Aretaking medicines that reduce the activity of (suppress) the immune system.  Have a long-term (chronic) illness, such as heart disease, kidney disease, diabetes, or lung disease.  Have a liver disorder.  Are obese.  Have anemia. What are the signs or symptoms? Symptoms of this condition typically last 4-10 days and may include:  Fever.  Chills.  Headache, body aches, or muscle aches.  Sore throat.  Cough.  Runny or congested nose.  Chest discomfort and cough.  Poor appetite.  Weakness or tiredness (fatigue).  Dizziness.  Nausea or vomiting. How is this diagnosed? This condition may be diagnosed based on your medical history and a physical exam. Your health care provider may do a nose or throat swab test to confirm the diagnosis. How is this treated? If influenza is detected early, you can be treated with antiviral medicine that can reduce the length of your illness and the severity of your symptoms. This medicine may be given by mouth (orally) or through an IV tube that is inserted in one of your veins. The goal of treatment is to relieve symptoms by taking care of yourself at home. This may include taking over-the-counter medicines, drinking plenty of fluids, and adding humidity to the air in your home. In some cases, influenza goes away on its own. Severe influenza or complications from influenza may be treated in a hospital. Follow these instructions at home:  Take over-the-counter and prescription medicines  only as told by your health care provider.  Use a cool mist humidifier to add humidity to the air in your home. This can make breathing easier.  Rest as needed.  Drink enough fluid to keep your urine clear or pale yellow.  Cover your mouth and nose when you cough or sneeze.  Wash your hands with soap and water often, especially after you cough or sneeze. If soap and water are not  available, use hand sanitizer.  Stay home from work or school as told by your health care provider. Unless you are visiting your health care provider, try to avoid leaving home until your fever has been gone for 24 hours without the use of medicine.  Keep all follow-up visits as told by your health care provider. This is important. How is this prevented?  Getting an annual flu shot is the best way to avoid getting the flu. You may get the flu shot in late summer, fall, or winter. Ask your health care provider when you should get your flu shot.  Wash your hands often or use hand sanitizer often.  Avoid contact with people who are sick during cold and flu season.  Eat a healthy diet, drink plenty of fluids, get enough sleep, and exercise regularly. Contact a health care provider if:  You develop new symptoms.  You have:  Chest pain.  Diarrhea.  A fever.  Your cough gets worse.  You produce more mucus.  You feel nauseous or you vomit. Get help right away if:  You develop shortness of breath or difficulty breathing.  Your skin or nails turn a bluish color.  You have severe pain or stiffness in your neck.  You develop a sudden headache or sudden pain in your face or ear.  You cannot stop vomiting. This information is not intended to replace advice given to you by your health care provider. Make sure you discuss any questions you have with your health care provider. Document Released: 06/08/2000 Document Revised: 11/17/2015 Document Reviewed: 04/05/2015 Elsevier Interactive Patient Education  2017 [employer].

## 2016-07-04 NOTE — Progress Notes (Signed)
Pre visit review using our clinic review tool, if applicable. No additional management support is needed unless otherwise documented below in the visit note. 

## 2016-07-04 NOTE — Progress Notes (Signed)
Subjective:    Patient ID: Cynthia Houston, female    DOB: April 25, 1966, 51 y.o.   MRN: [id number]  CC: Cynthia Houston is a 51 y.o. female who presents today for an acute visit.    HPI: CC: bodyaches, chills x 3 days, unchanged. Endorses tactile warmth, cough, ear pressure. Had congestion, sore throat week ago. Has taken tyelonol, advil (  Last dose 8 am today). No severe HA, neck pain. Denies exertional chest pain or pressure, numbness or tingling radiating to left arm or jaw, palpitations, dizziness, frequent headaches, changes in vision, or shortness of breath.   Flu vaccine.             HISTORY:  Past Medical History:  Diagnosis Date  . Chicken pox   . Depression   . Diffuse cystic mastopathy 2013  . Headache(784.0)   . Tendinitis    right elbow  . Urinary tract infection    Past Surgical History:  Procedure Laterality Date  . ABDOMINAL HYSTERECTOMY  2008   supracervical   . BREAST CYST ASPIRATION Right    neg  . BUNIONECTOMY Right 2011  . COLONOSCOPY  2008   Dr. Vira Agar  . DIAGNOSTIC LAPAROSCOPY    . ECTOPIC PREGNANCY SURGERY  1989  . gynecological surgery   2004  . TUBAL LIGATION    . UTERINE FIBROID SURGERY  2002   x2 2004   Family History  Problem Relation Age of Onset  . Hyperlipidemia Mother   . Hypertension Mother   . Arthritis Mother     rheumatoid arthritis  . Crohn's disease Father   . Heart disease Father 64    CABG  . Hyperlipidemia Father   . Hypertension Father   . Breast cancer Neg Hx     Allergies: Sulfa antibiotics; Augmentin [amoxicillin-pot clavulanate]; Percocet [oxycodone-acetaminophen]; and Doxycycline Current Outpatient Prescriptions on File Prior to Visit  Medication Sig Dispense Refill  . ALPRAZolam (XANAX) 0.25 MG tablet Take 1 tablet (0.25 mg total) by mouth 2 (two) times daily as needed for anxiety. 20 tablet 0  . Biotin (BIOTIN MAXIMUM STRENGTH) 10 MG TABS Take 1 tablet by mouth daily.    Marland Kitchen PREMARIN vaginal cream APPLY  1 GRAM INTRAVAGINALLY NIGHTLY FOR 2 WEEKS, THEN  TWICE WEEKLY THEREAFTER 30 g 3  . sertraline (ZOLOFT) 50 MG tablet Take 1 tablet (50 mg total) by mouth daily. 30 tablet 5   No current facility-administered medications on file prior to visit.     Social History  Substance Use Topics  . Smoking status: Never Smoker  . Smokeless tobacco: Never Used  . Alcohol use Yes     Comment: occassionally    Review of Systems  Constitutional: Positive for chills. Negative for fever.  HENT: Positive for congestion, rhinorrhea, sinus pressure and sore throat. Negative for sinus pain.   Respiratory: Positive for cough. Negative for shortness of breath and wheezing.   Cardiovascular: Negative for chest pain and palpitations.  Gastrointestinal: Negative for nausea and vomiting.  Musculoskeletal: Positive for myalgias.      Objective:    BP 112/62   Pulse 79   Temp 99 F (37.2 C) (Oral)   Ht 5' 3.5" (1.613 m)   Wt 119 lb (54 kg)   SpO2 99%   BMI 20.75 kg/m    Physical Exam  Constitutional: She appears well-developed and well-nourished.  HENT:  Head: Normocephalic and atraumatic.  Right Ear: Hearing, tympanic membrane, external ear and ear canal normal. No drainage,  swelling or tenderness. No foreign bodies. Tympanic membrane is not erythematous and not bulging. No middle ear effusion. No decreased hearing is noted.  Left Ear: Hearing, tympanic membrane, external ear and ear canal normal. No drainage, swelling or tenderness. No foreign bodies. Tympanic membrane is not erythematous and not bulging.  No middle ear effusion. No decreased hearing is noted.  Nose: Rhinorrhea present. Right sinus exhibits no maxillary sinus tenderness and no frontal sinus tenderness. Left sinus exhibits no maxillary sinus tenderness and no frontal sinus tenderness.  Mouth/Throat: Uvula is midline, oropharynx is clear and moist and mucous membranes are normal. No oropharyngeal exudate, posterior oropharyngeal edema,  posterior oropharyngeal erythema or tonsillar abscesses.  Eyes: Conjunctivae are normal.  Cardiovascular: Regular rhythm, normal heart sounds and normal pulses.   Pulmonary/Chest: Effort normal and breath sounds normal. She has no wheezes. She has no rhonchi. She has no rales.  Lymphadenopathy:       Head (right side): No submental, no submandibular, no tonsillar, no preauricular, no posterior auricular and no occipital adenopathy present.       Head (left side): No submental, no submandibular, no tonsillar, no preauricular, no posterior auricular and no occipital adenopathy present.    She has no cervical adenopathy.  Neurological: She is alert.  Skin: Skin is warm and dry.  Psychiatric: She has a normal mood and affect. Her speech is normal and behavior is normal. Thought content normal.  Vitals reviewed.      Assessment & Plan:   1. Influenza Flu positive. Right on 72 hour window to start tamiflu. Cough syrup PRN. Agreed to start Tamiflu. Return precautions given to patient.  - POCT Influenza A/B - oseltamivir (TAMIFLU) 75 MG capsule; Take 1 capsule (75 mg total) by mouth 2 (two) times daily.  Dispense: 10 capsule; Refill: 0 - chlorpheniramine-HYDROcodone (TUSSIONEX PENNKINETIC ER) 10-8 MG/5ML SUER; Take 5 mLs by mouth at bedtime as needed for cough.  Dispense: 140 mL; Refill: 0    I am having Cynthia Houston start on oseltamivir and chlorpheniramine-HYDROcodone. I am also having her maintain her Biotin, ALPRAZolam, sertraline, and PREMARIN.   Meds ordered this encounter  Medications  . oseltamivir (TAMIFLU) 75 MG capsule    Sig: Take 1 capsule (75 mg total) by mouth 2 (two) times daily.    Dispense:  10 capsule    Refill:  0    Order Specific Question:   Supervising Provider    Answer:   Deborra Medina L [[id number]]  . chlorpheniramine-HYDROcodone (TUSSIONEX PENNKINETIC ER) 10-8 MG/5ML SUER    Sig: Take 5 mLs by mouth at bedtime as needed for cough.    Dispense:  140 mL    Refill:  0     Order Specific Question:   Supervising Provider    Answer:   Crecencio Mc [[id number]]    Return precautions given.   Risks, benefits, and alternatives of the medications and treatment plan prescribed today were discussed, and patient expressed understanding.   Education regarding symptom management and diagnosis given to patient on AVS.  Continue to follow with TULLO, Aris Everts, MD for routine health maintenance.   Cynthia Houston and I agreed with plan.   Mable Paris, FNP

## 2016-07-08 ENCOUNTER — Encounter: Payer: Self-pay | Admitting: Internal Medicine

## 2016-07-09 MED ORDER — SERTRALINE HCL 50 MG PO TABS: 50.0000 mg | ORAL_TABLET | Freq: Every day | ORAL | 1 refills | Status: DC

## 2016-07-10 ENCOUNTER — Encounter: Payer: Self-pay | Admitting: Family

## 2016-10-15 ENCOUNTER — Other Ambulatory Visit: Payer: Self-pay | Admitting: Internal Medicine

## 2016-12-12 ENCOUNTER — Other Ambulatory Visit: Payer: Self-pay | Admitting: Internal Medicine

## 2016-12-12 ENCOUNTER — Encounter: Payer: Self-pay | Admitting: Internal Medicine

## 2016-12-12 ENCOUNTER — Ambulatory Visit (INDEPENDENT_AMBULATORY_CARE_PROVIDER_SITE_OTHER): Payer: 59 | Admitting: Internal Medicine

## 2016-12-12 VITALS — BP 100/68 | HR 71 | Temp 98.5°F | Resp 15 | Ht 63.5 in | Wt 118.8 lb

## 2016-12-12 DIAGNOSIS — Z90711 Acquired absence of uterus with remaining cervical stump: Secondary | ICD-10-CM | POA: Diagnosis not present

## 2016-12-12 DIAGNOSIS — Z23 Encounter for immunization: Secondary | ICD-10-CM

## 2016-12-12 DIAGNOSIS — Z124 Encounter for screening for malignant neoplasm of cervix: Secondary | ICD-10-CM | POA: Diagnosis not present

## 2016-12-12 DIAGNOSIS — Z87898 Personal history of other specified conditions: Secondary | ICD-10-CM | POA: Diagnosis not present

## 2016-12-12 DIAGNOSIS — Z780 Asymptomatic menopausal state: Secondary | ICD-10-CM | POA: Diagnosis not present

## 2016-12-12 DIAGNOSIS — R232 Flushing: Secondary | ICD-10-CM

## 2016-12-12 DIAGNOSIS — F411 Generalized anxiety disorder: Secondary | ICD-10-CM

## 2016-12-12 DIAGNOSIS — D709 Neutropenia, unspecified: Secondary | ICD-10-CM | POA: Diagnosis not present

## 2016-12-12 DIAGNOSIS — Z0000 Encounter for general adult medical examination without abnormal findings: Secondary | ICD-10-CM | POA: Diagnosis not present

## 2016-12-12 DIAGNOSIS — Z8742 Personal history of other diseases of the female genital tract: Secondary | ICD-10-CM

## 2016-12-12 MED ORDER — SERTRALINE HCL 50 MG PO TABS: 50.0000 mg | ORAL_TABLET | Freq: Every day | ORAL | 3 refills | Status: DC

## 2016-12-12 NOTE — Progress Notes (Signed)
Patient ID: Cynthia Houston, female    DOB: 12/04/1965  Age: 51 y.o. MRN: [id number]  The patient is here for annual preventive  examination and management of other chronic and acute problems.  She is s/p supracervical hysterectomy , needs PAP  Td booster is due , given today [employer] employee: comprehensive  labs ordered   The risk factors are reflected in the social history.  The roster of all physicians providing medical care to patient - is listed in the Snapshot section of the chart.  Activities of daily living:  The patient is 100% independent in all ADLs: dressing, toileting, feeding as well as independent mobility  Home safety : The patient has smoke detectors in the home. They wear seatbelts.  There are no firearms at home. There is no violence in the home.   There is no risks for hepatitis, STDs or HIV. There is no   history of blood transfusion. They have no travel history to infectious disease endemic areas of the world.  The patient has seen their dentist in the last six month. They have seen their eye doctor in the last year.    They do not  have excessive sun exposure. Discussed the need for sun protection: hats, long sleeves and use of sunscreen if there is significant sun exposure.   Diet: the importance of a healthy diet is discussed.  She has a healthy diet.  The benefits of regular aerobic exercise were discussed. She walks 4 times per week ,  20 minutes.   Depression screen: there are no signs or vegative symptoms of depression- irritability, change in appetite, anhedonia, sadness/tearfullness.   The following portions of the patient's history were reviewed and updated as appropriate: allergies, current medications, past family history, past medical history,  past surgical history, past social history  and problem list.  Visual acuity was not assessed per patient preference since she has regular follow up with her ophthalmologist. Hearing and body mass index were  assessed and reviewed.   During the course of the visit the patient was educated and counseled about appropriate screening and preventive services including , diabetes screening, nutrition counseling, colorectal cancer screening, and recommended immunizations.    CC: The primary encounter diagnosis was Encounter for preventive health examination. Diagnoses of Cervical cancer screening, Flushing, Postmenopausal estrogen deficiency, Need for tetanus booster, Neutropenia, unspecified type ([place]), S/P abdominal supracervical subtotal hysterectomy, History of abnormal cervical Pap smear, and Generalized anxiety disorder were also pertinent to this visit.  History Kyrah has a past medical history of Chicken pox; Depression; Diffuse cystic mastopathy (2013); Headache(784.0); Tendinitis; and Urinary tract infection.   She has a past surgical history that includes Ectopic pregnancy surgery (1989); Uterine fibroid surgery (2002); Abdominal hysterectomy (2008); gynecological surgery  (2004); Diagnostic laparoscopy; Tubal ligation; Bunionectomy (Right, 2011); Colonoscopy (2008); and Breast cyst aspiration (Right).   Her family history includes Arthritis in her mother; Crohn's disease in her father; Heart disease (age of onset: 38) in her father; Hyperlipidemia in her father and mother; Hypertension in her father and mother.She reports that she has never smoked. She has never used smokeless tobacco. She reports that she drinks alcohol. She reports that she does not use drugs.  Outpatient Medications Prior to Visit  Medication Sig Dispense Refill  . Biotin (BIOTIN MAXIMUM STRENGTH) 10 MG TABS Take 1 tablet by mouth daily.    Marland Kitchen ALPRAZolam (XANAX) 0.25 MG tablet Take 1 tablet (0.25 mg total) by mouth 2 (two) times daily as needed  for anxiety. 20 tablet 0  . sertraline (ZOLOFT) 50 MG tablet TAKE ONE TABLET BY MOUTH EVERY DAY 90 tablet 0  . chlorpheniramine-HYDROcodone (TUSSIONEX PENNKINETIC ER) 10-8 MG/5ML SUER  Take 5 mLs by mouth at bedtime as needed for cough. (Patient not taking: Reported on 12/12/2016) 140 mL 0  . oseltamivir (TAMIFLU) 75 MG capsule Take 1 capsule (75 mg total) by mouth 2 (two) times daily. (Patient not taking: Reported on 12/12/2016) 10 capsule 0  . PREMARIN vaginal cream APPLY 1 GRAM INTRAVAGINALLY NIGHTLY FOR 2 WEEKS, THEN  TWICE WEEKLY THEREAFTER (Patient not taking: Reported on 12/12/2016) 30 g 3   No facility-administered medications prior to visit.     Review of Systems   Patient denies headache, fevers, malaise, unintentional weight loss, skin rash, eye pain, sinus congestion and sinus pain, sore throat, dysphagia,  hemoptysis , cough, dyspnea, wheezing, chest pain, palpitations, orthopnea, edema, abdominal pain, nausea, melena, diarrhea, constipation, flank pain, dysuria, hematuria, urinary  Frequency, nocturia, numbness, tingling, seizures,  Focal weakness, Loss of consciousness,  Tremor, insomnia, depression, anxiety, and suicidal ideation.      Objective:  BP 100/68 (BP Location: Left Arm, Patient Position: Sitting, Cuff Size: Normal)   Pulse 71   Temp 98.5 F (36.9 C) (Oral)   Resp 15   Ht 5' 3.5" (1.613 m)   Wt 118 lb 12.8 oz (53.9 kg)   SpO2 98%   BMI 20.71 kg/m   Physical Exam   General Appearance:    Alert, cooperative, no distress, appears stated age  Head:    Normocephalic, without obvious abnormality, atraumatic  Eyes:    PERRL, conjunctiva/corneas clear, EOM's intact, fundi    benign, both eyes  Ears:    Normal TM's and external ear canals, both ears  Nose:   Nares normal, septum midline, mucosa normal, no drainage    or sinus tenderness  Throat:   Lips, mucosa, and tongue normal; teeth and gums normal  Neck:   Supple, symmetrical, trachea midline, no adenopathy;    thyroid:  no enlargement/tenderness/nodules; no carotid   bruit or JVD  Back:     Symmetric, no curvature, ROM normal, no CVA tenderness  Lungs:     Clear to auscultation bilaterally,  respirations unlabored  Chest Wall:    No tenderness or deformity   Heart:    Regular rate and rhythm, S1 and S2 normal, no murmur, rub   or gallop  Breast Exam:    No tenderness, masses, or nipple abnormality  Abdomen:     Soft, non-tender, bowel sounds active all four quadrants,    no masses, no organomegaly  Genitalia:    Pelvic: cervix normal appearing  external genitalia normal, no adnexal masses or tenderness,  rectovaginal septum normal, uterus surgically  absent and vagina normal without discharge  Extremities:   Extremities normal, atraumatic, no cyanosis or edema  Pulses:   2+ and symmetric all extremities  Skin:   Skin color, texture, turgor normal, no rashes or lesions  Lymph nodes:   Cervical, supraclavicular, and axillary nodes normal  Neurologic:   CNII-XII intact, normal strength, sensation and reflexes    throughout      Assessment & Plan:   Problem List Items Addressed This Visit    S/P abdominal supracervical subtotal hysterectomy    Pelvic and PAP smear was done today       Neutropenia (HCC)    She has had a history of mild leukopenia with mild neutropenia without recurrent bacterial infections  that ws nor ptrsent on last year's labs,  Repeat cbc will be done  Lab Results  Component Value Date   WBC 4.6 01/03/2016   HGB 13.8 01/03/2016   HCT 40.1 01/03/2016   MCV 87 01/03/2016   PLT 240 01/03/2016         History of abnormal cervical Pap smear    After ASCUS in 2012,  She had normal PAP smear in 2013, 2014 and 2015.  Repeat  Was done today       Generalized anxiety disorder    Managed with zoloft,  No longer taking xanax.  zoloft  Refill given.        Encounter for preventive health examination - Primary    Annual comprehensive preventive exam was done as well as an evaluation and management of chronic conditions .  During the course of the visit the patient was educated and counseled about appropriate screening and preventive services including :   diabetes screening, lipid analysis with projected  10 year  risk for CAD , nutrition counseling, breast, cervical and colorectal cancer screening, and recommended immunizations.  Printed recommendations for health maintenance screenings was given      Relevant Orders   Lipid panel   CBC with Differential/Platelet   Comprehensive metabolic panel   Hemoglobin A1c    Other Visit Diagnoses    Cervical cancer screening       Relevant Orders   Cytology - PAP   Flushing       Relevant Orders   TSH   Follicle stimulating hormone   LH   Postmenopausal estrogen deficiency       Relevant Orders   DG Bone Density   Need for tetanus booster       Relevant Orders   Td : Tetanus/diphtheria >7yo Preservative  free (Completed)      I have discontinued Ms. Childers's ALPRAZolam, PREMARIN, oseltamivir, and chlorpheniramine-HYDROcodone. I have also changed her sertraline. Additionally, I am having her maintain her Biotin.  Meds ordered this encounter  Medications  . sertraline (ZOLOFT) 50 MG tablet    Sig: Take 1 tablet (50 mg total) by mouth daily.    Dispense:  90 tablet    Refill:  3    FOR NEXT FILL. THANK YOU    Medications Discontinued During This Encounter  Medication Reason  . chlorpheniramine-HYDROcodone (TUSSIONEX PENNKINETIC ER) 10-8 MG/5ML SUER Therapy completed  . oseltamivir (TAMIFLU) 75 MG capsule Therapy completed  . PREMARIN vaginal cream Patient has not taken in last 30 days  . ALPRAZolam (XANAX) 0.25 MG tablet   . sertraline (ZOLOFT) 50 MG tablet Reorder    Follow-up: No Follow-up on file.   Crecencio Mc, MD

## 2016-12-12 NOTE — Patient Instructions (Addendum)
Try the Kale/broccoli cole slaw at [facility] with the [facility] (?)  Ginger dressing   PAP SMEAR DONE  DEXA ORDERED   Health Maintenance for Postmenopausal Women Menopause is a normal process in which your reproductive ability comes to an end. This process happens gradually over a span of months to years, usually between the ages of 20 and 31. Menopause is complete when you have missed 12 consecutive menstrual periods. It is important to talk with your health care provider about some of the most common conditions that affect postmenopausal women, such as heart disease, cancer, and bone loss (osteoporosis). Adopting a healthy lifestyle and getting preventive care can help to promote your health and wellness. Those actions can also lower your chances of developing some of these common conditions. What should I know about menopause? During menopause, you may experience a number of symptoms, such as:  Moderate-to-severe hot flashes.  Night sweats.  Decrease in sex drive.  Mood swings.  Headaches.  Tiredness.  Irritability.  Memory problems.  Insomnia.  Choosing to treat or not to treat menopausal changes is an individual decision that you make with your health care provider. What should I know about hormone replacement therapy and supplements? Hormone therapy products are effective for treating symptoms that are associated with menopause, such as hot flashes and night sweats. Hormone replacement carries certain risks, especially as you become older. If you are thinking about using estrogen or estrogen with progestin treatments, discuss the benefits and risks with your health care provider. What should I know about heart disease and stroke? Heart disease, heart attack, and stroke become more likely as you age. This may be due, in part, to the hormonal changes that your body experiences during menopause. These can affect how your body processes dietary fats, triglycerides, and cholesterol.  Heart attack and stroke are both medical emergencies. There are many things that you can do to help prevent heart disease and stroke:  Have your blood pressure checked at least every 1-2 years. High blood pressure causes heart disease and increases the risk of stroke.  If you are 2-63 years old, ask your health care provider if you should take aspirin to prevent a heart attack or a stroke.  Do not use any tobacco products, including cigarettes, chewing tobacco, or electronic cigarettes. If you need help quitting, ask your health care provider.  It is important to eat a healthy diet and maintain a healthy weight. ? Be sure to include plenty of vegetables, fruits, low-fat dairy products, and lean protein. ? Avoid eating foods that are high in solid fats, added sugars, or salt (sodium).  Get regular exercise. This is one of the most important things that you can do for your health. ? Try to exercise for at least 150 minutes each week. The type of exercise that you do should increase your heart rate and make you sweat. This is known as moderate-intensity exercise. ? Try to do strengthening exercises at least twice each week. Do these in addition to the moderate-intensity exercise.  Know your numbers.Ask your health care provider to check your cholesterol and your blood glucose. Continue to have your blood tested as directed by your health care provider.  What should I know about cancer screening? There are several types of cancer. Take the following steps to reduce your risk and to catch any cancer development as early as possible. Breast Cancer  Practice breast self-awareness. ? This means understanding how your breasts normally appear and feel. ?  It also means doing regular breast self-exams. Let your health care provider know about any changes, no matter how small.  If you are 79 or older, have a clinician do a breast exam (clinical breast exam or CBE) every year. Depending on your age,  family history, and medical history, it may be recommended that you also have a yearly breast X-ray (mammogram).  If you have a family history of breast cancer, talk with your health care provider about genetic screening.  If you are at high risk for breast cancer, talk with your health care provider about having an MRI and a mammogram every year.  Breast cancer (BRCA) gene test is recommended for women who have family members with BRCA-related cancers. Results of the assessment will determine the need for genetic counseling and BRCA1 and for BRCA2 testing. BRCA-related cancers include these types: ? Breast. This occurs in males or females. ? Ovarian. ? Tubal. This may also be called fallopian tube cancer. ? Cancer of the abdominal or pelvic lining (peritoneal cancer). ? Prostate. ? Pancreatic.  Cervical, Uterine, and Ovarian Cancer Your health care provider may recommend that you be screened regularly for cancer of the pelvic organs. These include your ovaries, uterus, and vagina. This screening involves a pelvic exam, which includes checking for microscopic changes to the surface of your cervix (Pap test).  For women ages 21-65, health care providers may recommend a pelvic exam and a Pap test every three years. For women ages 59-65, they may recommend the Pap test and pelvic exam, combined with testing for human papilloma virus (HPV), every five years. Some types of HPV increase your risk of cervical cancer. Testing for HPV may also be done on women of any age who have unclear Pap test results.  Other health care providers may not recommend any screening for nonpregnant women who are considered low risk for pelvic cancer and have no symptoms. Ask your health care provider if a screening pelvic exam is right for you.  If you have had past treatment for cervical cancer or a condition that could lead to cancer, you need Pap tests and screening for cancer for at least 20 years after your  treatment. If Pap tests have been discontinued for you, your risk factors (such as having a new sexual partner) need to be reassessed to determine if you should start having screenings again. Some women have medical problems that increase the chance of getting cervical cancer. In these cases, your health care provider may recommend that you have screening and Pap tests more often.  If you have a family history of uterine cancer or ovarian cancer, talk with your health care provider about genetic screening.  If you have vaginal bleeding after reaching menopause, tell your health care provider.  There are currently no reliable tests available to screen for ovarian cancer.  Lung Cancer Lung cancer screening is recommended for adults 69-86 years old who are at high risk for lung cancer because of a history of smoking. A yearly low-dose CT scan of the lungs is recommended if you:  Currently smoke.  Have a history of at least 30 pack-years of smoking and you currently smoke or have quit within the past 15 years. A pack-year is smoking an average of one pack of cigarettes per day for one year.  Yearly screening should:  Continue until it has been 15 years since you quit.  Stop if you develop a health problem that would prevent you from having lung cancer  treatment.  Colorectal Cancer  This type of cancer can be detected and can often be prevented.  Routine colorectal cancer screening usually begins at age 45 and continues through age 55.  If you have risk factors for colon cancer, your health care provider may recommend that you be screened at an earlier age.  If you have a family history of colorectal cancer, talk with your health care provider about genetic screening.  Your health care provider may also recommend using home test kits to check for hidden blood in your stool.  A small camera at the end of a tube can be used to examine your colon directly (sigmoidoscopy or colonoscopy). This  is done to check for the earliest forms of colorectal cancer.  Direct examination of the colon should be repeated every 5-10 years until age 39. However, if early forms of precancerous polyps or small growths are found or if you have a family history or genetic risk for colorectal cancer, you may need to be screened more often.  Skin Cancer  Check your skin from head to toe regularly.  Monitor any moles. Be sure to tell your health care provider: ? About any new moles or changes in moles, especially if there is a change in a mole's shape or color. ? If you have a mole that is larger than the size of a pencil eraser.  If any of your family members has a history of skin cancer, especially at a young age, talk with your health care provider about genetic screening.  Always use sunscreen. Apply sunscreen liberally and repeatedly throughout the day.  Whenever you are outside, protect yourself by wearing long sleeves, pants, a wide-brimmed hat, and sunglasses.  What should I know about osteoporosis? Osteoporosis is a condition in which bone destruction happens more quickly than new bone creation. After menopause, you may be at an increased risk for osteoporosis. To help prevent osteoporosis or the bone fractures that can happen because of osteoporosis, the following is recommended:  If you are 36-73 years old, get at least 1,000 mg of calcium and at least 600 mg of vitamin D per day.  If you are older than age 77 but younger than age 54, get at least 1,200 mg of calcium and at least 600 mg of vitamin D per day.  If you are older than age 4, get at least 1,200 mg of calcium and at least 800 mg of vitamin D per day.  Smoking and excessive alcohol intake increase the risk of osteoporosis. Eat foods that are rich in calcium and vitamin D, and do weight-bearing exercises several times each week as directed by your health care provider. What should I know about how menopause affects my mental  health? Depression may occur at any age, but it is more common as you become older. Common symptoms of depression include:  Low or sad mood.  Changes in sleep patterns.  Changes in appetite or eating patterns.  Feeling an overall lack of motivation or enjoyment of activities that you previously enjoyed.  Frequent crying spells.  Talk with your health care provider if you think that you are experiencing depression. What should I know about immunizations? It is important that you get and maintain your immunizations. These include:  Tetanus, diphtheria, and pertussis (Tdap) booster vaccine.  Influenza every year before the flu season begins.  Pneumonia vaccine.  Shingles vaccine.  Your health care provider may also recommend other immunizations. This information is not intended to replace  advice given to you by your health care provider. Make sure you discuss any questions you have with your health care provider. Document Released: 08/03/2005 Document Revised: 12/30/2015 Document Reviewed: 03/15/2015 Elsevier Interactive Patient Education  2018 [employer].

## 2016-12-14 ENCOUNTER — Other Ambulatory Visit: Payer: Self-pay | Admitting: Internal Medicine

## 2016-12-14 DIAGNOSIS — Z1231 Encounter for screening mammogram for malignant neoplasm of breast: Secondary | ICD-10-CM

## 2016-12-15 NOTE — Assessment & Plan Note (Addendum)
Managed with zoloft,  No longer taking xanax.  zoloft  Refill given.   

## 2016-12-15 NOTE — Assessment & Plan Note (Signed)
Annual comprehensive preventive exam was done as well as an evaluation and management of chronic conditions .  During the course of the visit the patient was educated and counseled about appropriate screening and preventive services including :  diabetes screening, lipid analysis with projected  10 year  risk for CAD , nutrition counseling, breast, cervical and colorectal cancer screening, and recommended immunizations.  Printed recommendations for health maintenance screenings was given 

## 2016-12-15 NOTE — Assessment & Plan Note (Signed)
After ASCUS in 2012,  She had normal PAP smear in 2013, 2014 and 2015.  Repeat  Was done today

## 2016-12-15 NOTE — Assessment & Plan Note (Signed)
Pelvic and PAP smear was done today

## 2016-12-15 NOTE — Assessment & Plan Note (Signed)
She has had a history of mild leukopenia with mild neutropenia without recurrent bacterial infections that ws nor ptrsent on last year's labs,  Repeat cbc will be done  Lab Results  Component Value Date   WBC 4.6 01/03/2016   HGB 13.8 01/03/2016   HCT 40.1 01/03/2016   MCV 87 01/03/2016   PLT 240 01/03/2016

## 2016-12-17 LAB — PAP LB AND HPV HIGH-RISK
HPV, high-risk: NEGATIVE
PAP Smear Comment: 0

## 2016-12-20 LAB — LIPID PANEL
CHOL/HDL RATIO: 2.1 ratio (ref 0.0–4.4)
CHOLESTEROL TOTAL: 174 mg/dL (ref 100–199)
HDL: 82 mg/dL (ref >39)
LDL Calculated: 83 mg/dL (ref 0–99)
TRIGLYCERIDES: 44 mg/dL (ref 0–149)
VLDL Cholesterol Cal: 9 mg/dL (ref 5–40)

## 2016-12-20 LAB — CBC WITH DIFFERENTIAL/PLATELET
Basophils Absolute: 0 10*3/uL (ref 0.0–0.2)
Basos: 1 %
EOS (ABSOLUTE): 0.5 10*3/uL — ABNORMAL HIGH (ref 0.0–0.4)
EOS: 12 %
HEMATOCRIT: 40.3 % (ref 34.0–46.6)
Hemoglobin: 13.6 g/dL (ref 11.1–15.9)
IMMATURE GRANULOCYTES: 0 %
Immature Grans (Abs): 0 10*3/uL (ref 0.0–0.1)
Lymphocytes Absolute: 1.6 10*3/uL (ref 0.7–3.1)
Lymphs: 38 %
MCH: 30.2 pg (ref 26.6–33.0)
MCHC: 33.7 g/dL (ref 31.5–35.7)
MCV: 89 fL (ref 79–97)
MONOCYTES: 9 %
MONOS ABS: 0.4 10*3/uL (ref 0.1–0.9)
Neutrophils Absolute: 1.7 10*3/uL (ref 1.4–7.0)
Neutrophils: 40 %
Platelets: 235 10*3/uL (ref 150–379)
RBC: 4.51 x10E6/uL (ref 3.77–5.28)
RDW: 14 % (ref 12.3–15.4)
WBC: 4.2 10*3/uL (ref 3.4–10.8)

## 2016-12-20 LAB — COMPREHENSIVE METABOLIC PANEL
ALT: 45 IU/L — AB (ref 0–32)
AST: 25 IU/L (ref 0–40)
Albumin/Globulin Ratio: 2 (ref 1.2–2.2)
Albumin: 4.4 g/dL (ref 3.5–5.5)
Alkaline Phosphatase: 47 IU/L (ref 39–117)
BUN/Creatinine Ratio: 23 (ref 9–23)
BUN: 17 mg/dL (ref 6–24)
Bilirubin Total: 0.5 mg/dL (ref 0.0–1.2)
CALCIUM: 9.1 mg/dL (ref 8.7–10.2)
CO2: 25 mmol/L (ref 20–29)
CREATININE: 0.75 mg/dL (ref 0.57–1.00)
Chloride: 102 mmol/L (ref 96–106)
GFR calc Af Amer: 105 mL/min/{1.73_m2} (ref >59)
GFR, EST NON AFRICAN AMERICAN: 91 mL/min/{1.73_m2} (ref >59)
GLOBULIN, TOTAL: 2.2 g/dL (ref 1.5–4.5)
Glucose: 91 mg/dL (ref 65–99)
Potassium: 4.5 mmol/L (ref 3.5–5.2)
SODIUM: 140 mmol/L (ref 134–144)
TOTAL PROTEIN: 6.6 g/dL (ref 6.0–8.5)

## 2016-12-20 LAB — FOLLICLE STIMULATING HORMONE: FSH: 187 m[IU]/mL

## 2016-12-20 LAB — HEMOGLOBIN A1C
ESTIMATED AVERAGE GLUCOSE: 114 mg/dL
Hgb A1c MFr Bld: 5.6 % (ref 4.8–5.6)

## 2016-12-20 LAB — LUTEINIZING HORMONE: LH: 61.4 m[IU]/mL

## 2016-12-20 LAB — TSH: TSH: 1.79 u[IU]/mL (ref 0.450–4.500)

## 2016-12-21 ENCOUNTER — Encounter: Payer: Self-pay | Admitting: Internal Medicine

## 2016-12-21 ENCOUNTER — Other Ambulatory Visit: Payer: Self-pay | Admitting: Internal Medicine

## 2016-12-21 DIAGNOSIS — R7401 Elevation of levels of liver transaminase levels: Secondary | ICD-10-CM | POA: Insufficient documentation

## 2016-12-21 DIAGNOSIS — R740 Nonspecific elevation of levels of transaminase and lactic acid dehydrogenase [LDH]: Principal | ICD-10-CM

## 2016-12-21 HISTORY — DX: Elevation of levels of liver transaminase levels: R74.01

## 2016-12-21 NOTE — Progress Notes (Signed)
hepati 

## 2016-12-31 ENCOUNTER — Encounter: Payer: Self-pay | Admitting: Internal Medicine

## 2017-01-03 ENCOUNTER — Telehealth: Payer: Self-pay

## 2017-01-03 DIAGNOSIS — R740 Nonspecific elevation of levels of transaminase and lactic acid dehydrogenase [LDH]: Principal | ICD-10-CM

## 2017-01-03 DIAGNOSIS — R7401 Elevation of levels of liver transaminase levels: Secondary | ICD-10-CM

## 2017-01-03 NOTE — Telephone Encounter (Signed)
Pt called and stated that she was at [facility] to get her labs drawn but they couldn't see the orders. Reordered the lab work and notified the pt and they were able to see it.

## 2017-01-04 LAB — HEPATIC FUNCTION PANEL
ALBUMIN: 4.5 g/dL (ref 3.5–5.5)
ALK PHOS: 49 IU/L (ref 39–117)
ALT: 74 IU/L — ABNORMAL HIGH (ref 0–32)
AST: 39 IU/L (ref 0–40)
BILIRUBIN TOTAL: 0.4 mg/dL (ref 0.0–1.2)
BILIRUBIN, DIRECT: 0.12 mg/dL (ref 0.00–0.40)
TOTAL PROTEIN: 6.9 g/dL (ref 6.0–8.5)

## 2017-01-06 ENCOUNTER — Other Ambulatory Visit: Payer: Self-pay | Admitting: Internal Medicine

## 2017-01-06 ENCOUNTER — Encounter: Payer: Self-pay | Admitting: Internal Medicine

## 2017-01-06 DIAGNOSIS — R740 Nonspecific elevation of levels of transaminase and lactic acid dehydrogenase [LDH]: Principal | ICD-10-CM

## 2017-01-06 DIAGNOSIS — R7401 Elevation of levels of liver transaminase levels: Secondary | ICD-10-CM

## 2017-01-09 ENCOUNTER — Ambulatory Visit
Admission: RE | Admit: 2017-01-09 | Discharge: 2017-01-09 | Disposition: A | Discharge status: Home / Self Care | Payer: 59 | Source: Ambulatory Visit | Attending: Internal Medicine | Admitting: Internal Medicine

## 2017-01-09 DIAGNOSIS — R740 Nonspecific elevation of levels of transaminase and lactic acid dehydrogenase [LDH]: Secondary | ICD-10-CM | POA: Diagnosis not present

## 2017-01-09 DIAGNOSIS — R1011 Right upper quadrant pain: Secondary | ICD-10-CM | POA: Insufficient documentation

## 2017-01-09 DIAGNOSIS — R7401 Elevation of levels of liver transaminase levels: Secondary | ICD-10-CM

## 2017-01-11 ENCOUNTER — Encounter: Payer: Self-pay | Admitting: Internal Medicine

## 2017-01-11 DIAGNOSIS — R7401 Elevation of levels of liver transaminase levels: Secondary | ICD-10-CM

## 2017-01-11 DIAGNOSIS — R740 Nonspecific elevation of levels of transaminase and lactic acid dehydrogenase [LDH]: Principal | ICD-10-CM

## 2017-01-16 ENCOUNTER — Other Ambulatory Visit: Payer: Self-pay | Admitting: Internal Medicine

## 2017-01-23 ENCOUNTER — Encounter: Payer: Self-pay | Admitting: Internal Medicine

## 2017-01-23 LAB — HEPATIC FUNCTION PANEL
ALT: 49 IU/L — AB (ref 0–32)
AST: 32 IU/L (ref 0–40)
Albumin: 4.5 g/dL (ref 3.5–5.5)
Alkaline Phosphatase: 48 IU/L (ref 39–117)
BILIRUBIN TOTAL: 0.2 mg/dL (ref 0.0–1.2)
Bilirubin, Direct: 0.09 mg/dL (ref 0.00–0.40)
TOTAL PROTEIN: 6.8 g/dL (ref 6.0–8.5)

## 2017-01-23 LAB — ANTI-SMITH ANTIBODY: ENA SM Ab Ser-aCnc: <0.2 AI (ref 0.0–0.9)

## 2017-01-23 LAB — IRON AND TIBC
IRON SATURATION: 26 % (ref 15–55)
IRON: 71 ug/dL (ref 27–159)
TIBC: 269 ug/dL (ref 250–450)
UIBC: 198 ug/dL (ref 131–425)

## 2017-01-23 LAB — HEPATITIS C ANTIBODY

## 2017-01-23 LAB — HEPATITIS B SURFACE ANTIGEN: Hepatitis B Surface Ag: NEGATIVE

## 2017-01-23 LAB — HEPATITIS B CORE ANTIBODY, TOTAL: Hep B Core Total Ab: NEGATIVE

## 2017-01-23 LAB — FERRITIN: FERRITIN: 63 ng/mL (ref 15–150)

## 2017-01-23 LAB — HEPATITIS B SURFACE ANTIBODY,QUALITATIVE: HEP B SURFACE AB, QUAL: NONREACTIVE

## 2017-01-23 LAB — HEPATITIS A ANTIBODY, IGM: Hep A IgM: NEGATIVE

## 2017-01-23 LAB — ANA: Anti Nuclear Antibody(ANA): NEGATIVE

## 2017-01-23 LAB — CK: CK TOTAL: 72 U/L (ref 24–173)

## 2017-01-24 ENCOUNTER — Other Ambulatory Visit: Payer: Self-pay | Admitting: Internal Medicine

## 2017-01-24 DIAGNOSIS — R1011 Right upper quadrant pain: Secondary | ICD-10-CM

## 2017-01-30 NOTE — Telephone Encounter (Signed)
Dr. Derrel Nip,  Patient has sent several messages back regarding her hida scan and why her insurance would not cover it. I told her it was denied by her insurance, I did not say that it was in peer to peer and you felt that it could be best handled by Dr. Allen Norris. She called her insurance and was told it was in peer to peer review. Can you please handle/respond since she is not accepting what I have sent her. Thanks!

## 2017-01-31 ENCOUNTER — Telehealth: Payer: Self-pay

## 2017-01-31 DIAGNOSIS — R1011 Right upper quadrant pain: Secondary | ICD-10-CM

## 2017-01-31 NOTE — Telephone Encounter (Signed)
Order has been put in 

## 2017-02-05 ENCOUNTER — Ambulatory Visit
Admission: RE | Admit: 2017-02-05 | Discharge: 2017-02-05 | Disposition: A | Discharge status: Home / Self Care | Payer: 59 | Source: Ambulatory Visit | Attending: Internal Medicine | Admitting: Internal Medicine

## 2017-02-05 ENCOUNTER — Encounter: Payer: Self-pay | Admitting: Internal Medicine

## 2017-02-05 DIAGNOSIS — Z1231 Encounter for screening mammogram for malignant neoplasm of breast: Secondary | ICD-10-CM | POA: Insufficient documentation

## 2017-02-05 DIAGNOSIS — M8588 Other specified disorders of bone density and structure, other site: Secondary | ICD-10-CM | POA: Diagnosis not present

## 2017-02-05 DIAGNOSIS — Z780 Asymptomatic menopausal state: Secondary | ICD-10-CM | POA: Insufficient documentation

## 2017-02-06 ENCOUNTER — Encounter: Payer: Self-pay | Admitting: Internal Medicine

## 2017-02-13 ENCOUNTER — Ambulatory Visit: Payer: 59

## 2017-03-05 ENCOUNTER — Encounter: Payer: Self-pay | Admitting: Gastroenterology

## 2017-03-05 ENCOUNTER — Ambulatory Visit (INDEPENDENT_AMBULATORY_CARE_PROVIDER_SITE_OTHER): Payer: 59 | Admitting: Gastroenterology

## 2017-03-05 VITALS — BP 119/72 | HR 61 | Temp 98.1°F | Ht 63.5 in | Wt 120.6 lb

## 2017-03-05 DIAGNOSIS — R748 Abnormal levels of other serum enzymes: Secondary | ICD-10-CM

## 2017-03-05 NOTE — Progress Notes (Signed)
Gastroenterology Consultation  Referring Provider:     Crecencio Mc, MD Primary Care Physician:  Crecencio Mc, MD Primary Gastroenterologist:  Dr. Allen Norris     Reason for Consultation:     Abnormal liver enzymes        HPI:   TANYIA GRABBE is a 51 y.o. y/o female referred for consultation & management of Abnormal liver enzymes by Dr. Derrel Nip, Aris Everts, MD.  This patient comes in today after having 3 blood tests over the last few months that showed her to have increased ALT.  The rest of the patient's liver enzymes were normal.  The patient reports that she has approximately 3 glasses of wine a week. The patient's ALT was 45 in June then 74 in July and a repeat 2 weeks later in July showed it to go back down to 49.  The rest of the patient's labs were unremarkable. Her acute hepatitis panel was negative. The patient had normal liver enzymes historically before then.  She does report some intermittent right upper quadrant pain but had a normal ultrasound.  The patient states that the abdominal pain is usually not related to eating.  The patient had a colonoscopy by Dr. Vira Agar in 2016.  There is no family history of any abnormal liver enzymes or liver disease.  Past Medical History:  Diagnosis Date  . Chicken pox   . Depression   . Diffuse cystic mastopathy 2013  . Headache(784.0)   . Tendinitis    right elbow  . Urinary tract infection     Past Surgical History:  Procedure Laterality Date  . ABDOMINAL HYSTERECTOMY  2008   supracervical   . BREAST CYST ASPIRATION Right    neg  . BUNIONECTOMY Right 2011  . COLONOSCOPY  2008   Dr. Vira Agar  . DIAGNOSTIC LAPAROSCOPY    . ECTOPIC PREGNANCY SURGERY  1989  . gynecological surgery   2004  . TUBAL LIGATION    . UTERINE FIBROID SURGERY  2002   x2 2004    Prior to Admission medications   Medication Sig Start Date End Date Taking? Authorizing Provider  Biotin (BIOTIN MAXIMUM STRENGTH) 10 MG TABS Take 1 tablet by mouth daily.   Yes  [provider]  Multiple Vitamin (MULTIVITAMIN) tablet Take 1 tablet by mouth daily.   Yes [provider]  sertraline (ZOLOFT) 50 MG tablet Take 1 tablet (50 mg total) by mouth daily. 12/12/16  Yes Crecencio Mc, MD    Family History  Problem Relation Age of Onset  . Hyperlipidemia Mother   . Hypertension Mother   . Arthritis Mother        rheumatoid arthritis  . Crohn's disease Father   . Heart disease Father 35       CABG  . Hyperlipidemia Father   . Hypertension Father   . Breast cancer Neg Hx      Social History  Substance Use Topics  . Smoking status: Never Smoker  . Smokeless tobacco: Never Used  . Alcohol use Yes     Comment: occassionally    Allergies as of 03/05/2017 - Review Complete 03/05/2017  Allergen Reaction Noted  . Sulfa antibiotics Other (See Comments)   . Augmentin [amoxicillin-pot clavulanate]  05/05/2012  . Percocet [oxycodone-acetaminophen]  05/05/2012  . Doxycycline Rash 07/19/2015    Review of Systems:    All systems reviewed and negative except where noted in HPI.   Physical Exam:  BP 119/72   Pulse  61   Temp 98.1 F (36.7 C) (Oral)   Ht 5' 3.5" (1.613 m)   Wt 120 lb 9.6 oz (54.7 kg)   BMI 21.03 kg/m  No LMP recorded. Patient has had a hysterectomy. Psych:  Alert and cooperative. Normal mood and affect. General:   Alert,  Well-developed, well-nourished, pleasant and cooperative in NAD Head:  Normocephalic and atraumatic. Eyes:  Sclera clear, no icterus.   Conjunctiva pink. Ears:  Normal auditory acuity. Nose:  No deformity, discharge, or lesions. Mouth:  No deformity or lesions,oropharynx pink & moist. Neck:  Supple; no masses or thyromegaly. Lungs:  Respirations even and unlabored.  Clear throughout to auscultation.   No wheezes, crackles, or rhonchi. No acute distress. Heart:  Regular rate and rhythm; no murmurs, clicks, rubs, or gallops. Abdomen:  Normal bowel sounds.  No bruits.  Soft, non-tender and  non-distended without masses, hepatosplenomegaly or hernias noted.  No guarding or rebound tenderness.  Negative Carnett sign.   Rectal:  Deferred.  Msk:  Symmetrical without gross deformities.  Good, equal movement & strength bilaterally. Pulses:  Normal pulses noted. Extremities:  No clubbing or edema.  No cyanosis. Neurologic:  Alert and oriented x3;  grossly normal neurologically. Skin:  Intact without significant lesions or rashes.  No jaundice. Lymph Nodes:  No significant cervical adenopathy. Psych:  Alert and cooperative. Normal mood and affect.  Imaging Studies: Dg Bone Density  Result Date: 02/05/2017 EXAM: DUAL X-RAY ABSORPTIOMETRY (DXA) FOR BONE MINERAL DENSITY IMPRESSION: Dear Dr. Crecencio Mc, Your patient SHERIN MURDOCH completed a FRAX assessment on 02/05/2017 using the [place] (analysis version: 14.10) manufactured by [employer]. The following summarizes the results of our evaluation. PATIENT BIOGRAPHICAL: Name: Deania, Siguenza Patient ID: [id number] Birth Date: [birth year] Height:    63.5 in. Gender:     Female    [age]       Weight:    119.0 lbs. Ethnicity:  White                            Exam Date: 02/05/2017 FRAX* RESULTS:  (version: 3.5) 10-year Probability of Fracture1 Major Osteoporotic Fracture2 Hip Fracture 6.8% 1.1% Population: [place] (Caucasian) Risk Factors: None Based on Femur (Left) Neck BMD 1 -The 10-year probability of fracture may be lower than reported if the patient has received treatment. 2 -Major Osteoporotic Fracture: Clinical Spine, Forearm, Hip or Shoulder *FRAX is a Materials engineer of the [employer] for Metabolic Bone Disease, a [place] (WHO) [facility]. ASSESSMENT: The probability of a major osteoporotic fracture is 6.8% within the next ten years. The probability of a hip fracture is 1.1% within the next ten years. . Dear Dr. Crecencio Mc, Your patient Safiyah Cisney  completed a BMD test on 02/05/2017 using the [place] (analysis version: 14.10) manufactured by [employer]. The following summarizes the results of our evaluation. PATIENT BIOGRAPHICAL: Name: Lyndsey, Demos Patient ID: [id number] Birth Date: May 12, 1966 Height: 63.5 in. Gender: Female Exam Date: 02/05/2017 Weight: 119.0 lbs. Indications: Caucasian, Family Hx of Osteoporosis, Hysterectomy, Postmenopausal Fractures: Treatments: Multi-Vitamin with calcium ASSESSMENT: The BMD measured at Femur Neck Left is 0.739 g/cm2 with a T-score of -2.2. This patient is considered osteopenic according to [facility]) criteria. Site Region Measured Measured WHO Young Adult BMD Date       Age  Classification T-score AP Spine L1-L4 02/05/2017 53.5 Osteopenia -1.9 0.965 g/cm2 DualFemur Neck Left 02/05/2017 53.5 Osteopenia -2.2 0.739 g/cm2 World Health Organization [facility]) criteria for post-menopausal, Caucasian Women: Normal:       T-score at or above -1 SD Osteopenia:   T-score between -1 and -2.5 SD Osteoporosis: T-score at or below -2.5 SD RECOMMENDATIONS: [place] recommends that FDA-approved medical therapies be considered in postmenopausal women and men age 78 or older with a: 1. Hip or vertebral (clinical or morphometric) fracture. 2. T-score of < -2.5 at the spine or hip. 3. Ten-year fracture probability by FRAX of 3% or greater for hip fracture or 20% or greater for major osteoporotic fracture. All treatment decisions require clinical judgment and consideration of individual patient factors, including patient preferences, co-morbidities, previous drug use, risk factors not captured in the FRAX model (e.g. falls, vitamin D deficiency, increased bone turnover, interval significant decline in bone density) and possible under - or over-estimation of fracture risk by FRAX. All patients should ensure an adequate intake of dietary calcium (1200 mg/d) and vitamin D (800 IU  daily) unless contraindicated. FOLLOW-UP: People with diagnosed cases of osteoporosis or at high risk for fracture should have regular bone mineral density tests. For patients eligible for Medicare, routine testing is allowed once every 2 years. The testing frequency can be increased to one year for patients who have rapidly progressing disease, those who are receiving or discontinuing medical therapy to restore bone mass, or have additional risk factors. I have reviewed this report, and agree with the above findings. [facility] Radiology Electronically Signed   By: Lowella Grip III M.D.   On: 02/05/2017 13:51   Mm Screening Breast Tomo Bilateral  Result Date: 02/05/2017 CLINICAL DATA:  Screening. EXAM: 2D DIGITAL SCREENING BILATERAL MAMMOGRAM WITH CAD AND ADJUNCT TOMO COMPARISON:  Previous exam(s). ACR Breast Density Category d: The breast tissue is extremely dense, which lowers the sensitivity of mammography. FINDINGS: There are no findings suspicious for malignancy. Images were processed with CAD. IMPRESSION: No mammographic evidence of malignancy. A result letter of this screening mammogram will be mailed directly to the patient. RECOMMENDATION: Screening mammogram in one year. (Code:SM-B-01Y) BI-RADS CATEGORY  1: Negative. Electronically Signed   By: Fidela Salisbury M.D.   On: 02/05/2017 16:43    Assessment and Plan:   CACEY WILLOW is a 51 y.o. y/o female who comes in with isolated ALT elevation.  The patient will have her labs centile for possible causes of her abnormal liver enzymes.  The patient will also have her liver enzymes checked again as well as a GGT to make sure that the elevated ALT is coming from a liver source and not another source.  The patient will also have her liver enzymes checked again to see if they have returned to normal.  The patient has already been set up for a HIDA scan but this will be put on hold for now to see if her labs come back to normal. The patient has  been explained the plan and agrees with it.  Lucilla Lame, MD. Marval Regal   Note: This dictation was prepared with Dragon dictation along with smaller phrase technology. Any transcriptional errors that result from this process are unintentional.

## 2017-03-07 LAB — HEPATIC FUNCTION PANEL
ALBUMIN: 4.3 g/dL (ref 3.5–5.5)
ALK PHOS: 48 IU/L (ref 39–117)
ALT: 47 IU/L — AB (ref 0–32)
AST: 33 IU/L (ref 0–40)
BILIRUBIN TOTAL: 0.3 mg/dL (ref 0.0–1.2)
Bilirubin, Direct: 0.08 mg/dL (ref 0.00–0.40)
Total Protein: 6.6 g/dL (ref 6.0–8.5)

## 2017-03-07 LAB — ANTI-SMOOTH MUSCLE ANTIBODY, IGG: SMOOTH MUSCLE AB: 14 U (ref 0–19)

## 2017-03-07 LAB — MITOCHONDRIAL ANTIBODIES: MITOCHONDRIAL AB: 7.7 U (ref 0.0–20.0)

## 2017-03-07 LAB — ALPHA-1-ANTITRYPSIN: A1 ANTITRYPSIN: 114 mg/dL (ref 90–200)

## 2017-03-07 LAB — HEPATITIS A ANTIBODY, TOTAL: Hep A Total Ab: NEGATIVE

## 2017-03-07 LAB — GAMMA GT: GGT: 15 IU/L (ref 0–60)

## 2017-03-07 LAB — HEPATITIS B SURFACE ANTIBODY,QUALITATIVE: Hep B Surface Ab, Qual: NONREACTIVE

## 2017-03-07 LAB — CERULOPLASMIN: CERULOPLASMIN: 22.7 mg/dL (ref 19.0–39.0)

## 2017-03-07 LAB — ANA: Anti Nuclear Antibody(ANA): NEGATIVE

## 2017-03-08 ENCOUNTER — Telehealth: Payer: Self-pay

## 2017-03-08 NOTE — Telephone Encounter (Signed)
See previous message from Dr. Allen Norris regarding lab results.

## 2017-03-08 NOTE — Telephone Encounter (Signed)
-----   Message from Lucilla Lame, MD sent at 03/08/2017 10:04 AM EDT ----- Please disregard the last sends about a parasite a worm that was supposed to be on a different patient.  But the beginning note of the GGT being normal holds true am not sure that the increased and ALT is due to her liver with a normal GGT.

## 2017-03-08 NOTE — Telephone Encounter (Signed)
-----   Message from Lucilla Lame, MD sent at 03/08/2017 10:01 AM EDT ----- Let the patient know that her ALT continues to be high and all of her other labs are negative including the GGT which would be Indicative of this being a liver problem.  With the GGT being normal I have my doubt that this is from the liver.  We are still waiting for the stool studies and if she has a parasite or worm irritating the small intestines this could cause the elevation of the ALT.

## 2017-03-08 NOTE — Telephone Encounter (Signed)
Studies show that an isolated ALT without increased triglycerides, GGT or high cholesterol with a normal U/S are not to be concerned about. If she has high triglycerides or cholesterol then these should be treated. Since it had been normal in the past then it may return to normal. Again, since the GGT is normal it is unlikely liver disease.

## 2017-03-08 NOTE — Telephone Encounter (Signed)
Pt.notified

## 2017-03-08 NOTE — Telephone Encounter (Signed)
Pt notified of results and would like to know what your thoughts are on the next step since this ALT was elevated.

## 2017-03-11 ENCOUNTER — Ambulatory Visit: Payer: 59

## 2017-03-20 NOTE — Telephone Encounter (Signed)
Error

## 2017-04-22 ENCOUNTER — Encounter: Payer: Self-pay | Admitting: Internal Medicine

## 2017-04-23 ENCOUNTER — Encounter: Payer: Self-pay | Admitting: Internal Medicine

## 2017-06-27 ENCOUNTER — Ambulatory Visit: Payer: Managed Care, Other (non HMO) | Admitting: Family Medicine

## 2017-06-27 ENCOUNTER — Encounter: Payer: Self-pay | Admitting: Family Medicine

## 2017-06-27 ENCOUNTER — Other Ambulatory Visit: Payer: Self-pay

## 2017-06-27 ENCOUNTER — Ambulatory Visit (INDEPENDENT_AMBULATORY_CARE_PROVIDER_SITE_OTHER)
Admission: RE | Admit: 2017-06-27 | Discharge: 2017-06-27 | Disposition: A | Discharge status: Home / Self Care | Payer: Managed Care, Other (non HMO) | Source: Ambulatory Visit | Attending: Family Medicine | Admitting: Family Medicine

## 2017-06-27 VITALS — BP 110/70 | HR 58 | Temp 97.4°F | Ht 63.5 in | Wt 123.8 lb

## 2017-06-27 DIAGNOSIS — M5412 Radiculopathy, cervical region: Secondary | ICD-10-CM

## 2017-06-27 MED ORDER — PREDNISONE 20 MG PO TABS
ORAL_TABLET | ORAL | 0 refills | Status: DC
Start: 2017-06-27 — End: 2017-09-25

## 2017-06-27 NOTE — Patient Instructions (Signed)
Back/Neck: How to set up your workstation  Key Points:  Chair:  An adjustable chair to fit your height with a slight downward tilt of chair seat is helpful.  Should have good high-back support that assists with keeping the natural curves of your spine.  A "lumbar roll" or pillow at your low back can help you keep good posture.  Adjustable arm rests may decrease strain in upper body.   Keyboard:  Should be close and at elbow or just below elbow height.  Split keyboards can help decrease strain on wrist.  Wrist supports can provide mini-breaks to your wrists throughout the day.  Monitor/Screen:  Top of screen should be at eye level or slightly lower.  Good lighting is important to prevent eye strain, or headaches from glare.  Head Posture: A copy holder on either side of the monitor will help limit neck strain.  Head should not be forward.  Ears should line up with you shoulders.  Leg Posture:  Knee and hips should be bent half-way (about a 90 degree angle).  Shorter people may need something to prop their feet on, with knees bent, like a phone book.  Other Stuff: Keep the stuff (phones, pens, phonebooks, etc) that you use frequently close to you, so you're not straining to reach them.

## 2017-06-27 NOTE — Progress Notes (Signed)
Dr. Frederico Hamman T. Skyelyn Scruggs, MD, [facility] [place], [address] Phone: [phone] Fax: [phone]  06/27/2017  Patient: Cynthia Houston, MRN: [id number], DOB: 05/26/1966, 52 y.o.  Primary Physician:  Crecencio Mc, MD   Chief Complaint  Patient presents with  . Shoulder Pain    Right-Started first of December   Subjective:   Cynthia Houston is a 52 y.o. very pleasant female patient who presents with the following:  1 month, felt like pulled a muscle some, some numbness in her hand and arm. Some pain around the shoulder blade.  She presents with a primary complaint of shoulder pain, but she primarily is complaining of pain in her shoulder blade region, trapezius region as well as radicular symptoms down her right arm as well as numbness in her right arm and hand.  She is not having any significant pain with abduction or internal range of motion.  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  Past Medical History, Surgical History, Social History, Family History, Problem List, Medications, and Allergies have been reviewed and updated if relevant.  Patient Active Problem List   Diagnosis Date Noted  . Elevated ALT measurement 12/21/2016  . Generalized anxiety disorder 01/03/2016  . Concentration deficit 12/13/2015  . 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 Chronic fatigue 05/07/2014  . Dyspareunia, female 05/07/2014  . Constipation 11/20/2012  . Encounter for preventive health examination 11/20/2012  . Fibrocystic breast disease 09/24/2012  . History of abnormal cervical Pap smear 05/07/2012  . Neutropenia ([place]) 05/07/2012    Past Medical History:  Diagnosis Date  . Chicken pox   . Depression   . Diffuse cystic mastopathy 2013  . Headache(784.0)   . Tendinitis    right elbow  . Urinary tract infection     Past Surgical History:  Procedure Laterality Date  .  ABDOMINAL HYSTERECTOMY  2008   supracervical   . BREAST CYST ASPIRATION Right    neg  . BUNIONECTOMY Right 2011  . COLONOSCOPY  2008   Dr. Vira Agar  . DIAGNOSTIC LAPAROSCOPY    . ECTOPIC PREGNANCY SURGERY  1989  . gynecological surgery   2004  . TUBAL LIGATION    . UTERINE FIBROID SURGERY  2002   x2 2004    Social History   Socioeconomic History  . Marital status: Divorced    Spouse name: Not on file  . Number of children: Not on file  . Years of education: Not on file  . Highest education level: Not on file  Social Needs  . Financial resource strain: Not on file  . Food insecurity - worry: Not on file  . Food insecurity - inability: Not on file  . Transportation needs - medical: Not on file  . Transportation needs - non-medical: Not on file  Occupational History  . Not on file  Tobacco Use  . Smoking status: Never Smoker  . Smokeless tobacco: Never Used  Substance and Sexual Activity  . Alcohol use: Yes    Comment: occassionally  . Drug use: No  . Sexual activity: Not on file  Other Topics Concern  . Not on file  Social History Narrative  . Not on file    Family History  Problem Relation Age of Onset  . Hyperlipidemia Mother   . Hypertension Mother   . Arthritis Mother        rheumatoid arthritis  .  Crohn's disease Father   . Heart disease Father 64       CABG  . Hyperlipidemia Father   . Hypertension Father   . Breast cancer Neg Hx     Allergies  Allergen Reactions  . Sulfa Antibiotics Other (See Comments)    Uncoded Allergy. Allergen: Tenny Craw, Other Reaction: CNS Disorder  . Augmentin [Amoxicillin-Pot Clavulanate]   . Percocet [Oxycodone-Acetaminophen]   . Doxycycline Rash    Medication list reviewed and updated in full in [facility].  GEN: No fevers, chills. Nontoxic. Primarily MSK c/o today. MSK: Detailed in the HPI GI: tolerating PO intake without difficulty Neuro: as above Otherwise the pertinent positives of the ROS are noted  above.   Objective:   BP 110/70   Pulse (!) 58   Temp (!) 97.4 F (36.3 C) (Oral)   Ht 5' 3.5" (1.613 m)   Wt 123 lb 12 oz (56.1 kg)   BMI 21.58 kg/m    GEN: Well-developed,well-nourished,in no acute distress; alert,appropriate and cooperative throughout examination HEENT: Normocephalic and atraumatic without obvious abnormalities. Ears, externally no deformities PULM: Breathing comfortably in no respiratory distress EXT: No clubbing, cyanosis, or edema PSYCH: Normally interactive. Cooperative during the interview. Pleasant. Friendly and conversant. Not anxious or depressed appearing. Normal, full affect.  CERVICAL SPINE EXAM Range of motion: Flexion, extension, lateral bending, and rotation: Forward flexion has less than 10% loss of motion.  Extension is grossly normal.  Lateral bending has an approximate 10-15% loss of motion.  Rotational movements cause pain. Pain with terminal motion: As above. Spinous Processes: NT SCM: NT Upper paracervical muscles: NT Upper traps: TTP on the traps C5-T1 intact, decreased sensation throughout the majority of the upper arm, lower arm as well as hand.  This is to soft touch as well as pinprick.  All strength testing C5 through T1 is 5/5.  Spurlings is positive to the R  Shoulder: R Inspection: No muscle wasting or winging Ecchymosis/edema: neg  AC joint, scapula, clavicle: NT Abduction: full, 5/5 Flexion: full, 5/5 IR, full, lift-off: 5/5 ER at neutral: full, 5/5 AC crossover and compression: neg Neer: neg Hawkins: neg Drop Test: neg Empty Can: neg Supraspinatus insertion: NT Bicipital groove: NT Speed's: neg Yergason's: neg Sulcus sign: neg Scapular dyskinesis: none  Radiology: Dg Cervical Spine Complete  Result Date: 06/27/2017 CLINICAL DATA:  Neck pain.  Right radicular pain. EXAM: CERVICAL SPINE - COMPLETE 4+ VIEW COMPARISON:  03/08/2011. FINDINGS: Diffuse degenerative change. Degenerative changes most prominent at C5-C6.  2 mm anterolisthesis C6 on C7 and C7 on T1. These changes are most likely degenerative. Minimal bilateral neural foraminal narrowing. Pulmonary apices are clear. IMPRESSION: 1. No acute abnormality identified. 2. Diffuse degenerative change. Degenerative changes most prominent C5-C6. 2 mm anterolisthesis C6 on C7 and C7 on T1. These changes are most likely degenerative. Minimal bilateral neural foraminal narrowing. Electronically Signed   By: Marcello Moores  Register   On: 06/27/2017 17:26     Assessment and Plan:   Right cervical radiculopathy - Plan: DG Cervical Spine Complete, Ambulatory referral to Physical Therapy  Primary pathology is from the cervical spine.  Patient certainly has degenerative changes, DDD, as well as foraminal narrowing which would give source for radiculopathy. Shoulder exam benign.  Recommended basic McKenzie protocol exercises to begin now, and I referred the patient for formal physical therapy as well as traction.  Additional 10 days of prednisone.  I appreciate the opportunity to evaluate this very friendly patient. If you have any question  regarding her care or prognosis, do not hesitate to ask.   Follow-up: Return in about 6 weeks (around 08/08/2017).  Meds ordered this encounter  Medications  . predniSONE (DELTASONE) 20 MG tablet    Sig: 2 tabs po for 5 days, then 1 po for 5 days    Dispense:  15 tablet    Refill:  0   Medications Discontinued During This Encounter  Medication Reason  . Biotin (BIOTIN MAXIMUM STRENGTH) 10 MG TABS Patient Preference   Orders Placed This Encounter  Procedures  . DG Cervical Spine Complete  . Ambulatory referral to Physical Therapy    Signed,  Frederico Hamman T. Christapher Gillian, MD   Allergies as of 06/27/2017      Reactions   Sulfa Antibiotics Other (See Comments)   Uncoded Allergy. Allergen: Tenny Craw, Other Reaction: CNS Disorder   Augmentin [amoxicillin-pot Clavulanate]    Percocet [oxycodone-acetaminophen]    Doxycycline Rash        Medication List        Accurate as of 06/27/17 11:59 PM. Always use your most recent med list.          multivitamin tablet Take 1 tablet by mouth daily.   predniSONE 20 MG tablet Commonly known as:  DELTASONE 2 tabs po for 5 days, then 1 po for 5 days   sertraline 50 MG tablet Commonly known as:  ZOLOFT Take 1 tablet (50 mg total) by mouth daily.   Vitamin B-12 5000 MCG Subl Place 1 tablet under the tongue daily.

## 2017-06-28 ENCOUNTER — Encounter: Payer: Self-pay | Admitting: Family Medicine

## 2017-07-01 ENCOUNTER — Ambulatory Visit: Payer: Self-pay | Admitting: *Deleted

## 2017-07-01 NOTE — Telephone Encounter (Signed)
Spoke with patient. She was given a prednisone taper on 06/27/17. Started taking on 06/28/17. Patient noticed blurred vision this morning when she got up to get ready for work. When she blinks the blurred vision goes away. Patient says that she just feels foggy and weird. Patient says she feels a lot better now than she did this morning. She was just wondering if those are side effects to the prednisone and what she should do if so. She has 5 days left of the prednisone. Patient was advised to go to urgent care or ED and was offered an appointment within [facility] and patient declined. Please advise

## 2017-07-01 NOTE — Telephone Encounter (Signed)
She should see her eye doctor.

## 2017-07-01 NOTE — Telephone Encounter (Signed)
Pt having complaints of blurry vision that started this morning around 6am after getting up for work. Pt states she has been feeling "foggy" and feels "weird". Pt is also experiencing headaches and states she feels like a film is over her eye, but it goes away with blinking. Pt did check her BP this morning which was 117/68. Pt was prescribed prednisone after being seen on 1/3 by Dr. Lorelei Pont and is asking if these are side effects of taking prednisone. Pt advised to go to ED for further evaluation of current symptoms. Pt does not want to go to ED because of the cost, so pt was advised to go to Urgent Care or an appt could be scheduled. Pt states she does not think it is that serious at this time and states she will wait to see if current symptoms get better and if not she will call back to schedule an appt.  Reason for Disposition . [1] Blurred vision or visual changes AND [2] present now AND [3] sudden onset or new (e.g., minutes, hours, days)  (Exception: seeing floaters / black specks OR previously diagnosed migraine headaches with same symptoms)  Answer Assessment - Initial Assessment Questions 1. DESCRIPTION: "What is the vision loss like? Describe it for me." (e.g., complete vision loss, blurred vision, double vision, floaters, etc.)     Blurry vision 2. LOCATION: "One or both eyes?" If one, ask: "Which eye?"     Both eyes, right eye is worse 3. SEVERITY: "Can you see anything?" If so, ask: "What can you see?" (e.g., fine print)     States she has blurry vision but can make out fine print with glasses 4. ONSET: "When did this begin?" "Did it start suddenly or has this been gradual?"      This morning around 6am 5. PATTERN: "Does this come and go, or has it been constant since it started?"     Constant 6. PAIN: "Is there any pain in your eye(s)?"  (Scale 1-10; or mild, moderate, severe)     No pain 7. CONTACTS-GLASSES: "Do you wear contacts or glasses?"     Reading glasses 8. CAUSE: "What do  you think is causing this visual problem?"     Prednison 9. OTHER SYMPTOMS: "Do you have any other symptoms?" (e.g., confusion, headache, arm or leg weakness, speech problems)     Headache-dull ache 3 on a scale of 1-10 10. PREGNANCY: "Is there any chance you are pregnant?" "When was your last menstrual period?"       No  Protocols used: [place]

## 2017-07-01 NOTE — Telephone Encounter (Signed)
Patient agreed to do so.

## 2017-07-02 ENCOUNTER — Encounter: Payer: Self-pay | Admitting: Physical Therapy

## 2017-07-02 ENCOUNTER — Ambulatory Visit: Payer: Managed Care, Other (non HMO) | Attending: Family Medicine | Admitting: Physical Therapy

## 2017-07-02 DIAGNOSIS — M542 Cervicalgia: Secondary | ICD-10-CM | POA: Diagnosis present

## 2017-07-02 DIAGNOSIS — M62838 Other muscle spasm: Secondary | ICD-10-CM

## 2017-07-02 DIAGNOSIS — M6281 Muscle weakness (generalized): Secondary | ICD-10-CM | POA: Diagnosis present

## 2017-07-03 NOTE — Therapy (Signed)
[place] [address], [place], [address] Phone: [phone]   Fax:  [phone]  Physical Therapy Evaluation  Patient Details  Name: Cynthia Houston MRN: [id number] Date of Birth: 11/22/1965 Referring Provider: Owens Loffler MD   Encounter Date: 07/02/2017  PT End of Session - 07/02/17 1848    Visit Number  1    Number of Visits  12    Date for PT Re-Evaluation  08/13/17    PT Start Time  7035    PT Stop Time  1830    PT Time Calculation (min)  75 min    Activity Tolerance  Patient tolerated treatment well    Behavior During Therapy  [facility] for tasks assessed/performed       Past Medical History:  Diagnosis Date  . Chicken pox   . Depression   . Diffuse cystic mastopathy 2013  . Headache(784.0)   . Tendinitis    right elbow  . Urinary tract infection     Past Surgical History:  Procedure Laterality Date  . ABDOMINAL HYSTERECTOMY  2008   supracervical   . BREAST CYST ASPIRATION Right    neg  . BUNIONECTOMY Right 2011  . COLONOSCOPY  2008   Dr. Vira Agar  . DIAGNOSTIC LAPAROSCOPY    . ECTOPIC PREGNANCY SURGERY  1989  . gynecological surgery   2004  . TUBAL LIGATION    . UTERINE FIBROID SURGERY  2002   x2 2004    There were no vitals filed for this visit.   Subjective Assessment - 07/02/17 1730    Subjective  Patient reports pain in neck December 06/2016 and evolved until pain radiating intor right UE/ went and got massage and threapist noticed swelling right upper back, without lasting results; continued to wworsen with sleeping and now having numbees and tingling into right hand , thumb, up to 8/10 tingling and numbness intermittent     Limitations  Sitting;Reading;House hold activities    Patient Stated Goals  to get rid of pain and be able to exercise and perform activties without pain, sleep without pain    Currently in Pain?  Yes    Pain Score  6     Pain Location  Neck    Pain  Orientation  Right    Pain Radiating Towards  right arm and shoulder     Pain Onset  More than a month ago    Pain Frequency  Intermittent    Pain Relieving Factors  Aleve, Advil and positioning         [facility] PT Assessment - 07/02/17 1749      Assessment   Medical Diagnosis  Right Cervical Radiculopathy     Referring Provider  Owens Loffler MD    Onset Date/Surgical Date  05/25/17    Hand Dominance  Right      Precautions   Precautions  None      Balance Screen   Has the patient fallen in the past 6 months  No      [place] residence    Living Arrangements  Other (Comment) with family    Type of [place] Access  Level entry    Home Layout  One level      Prior Function   Level of Independence  Independent    Vocation  Full time employment    Engineer, building services;  computer; sitting    Leisure  exercise, spending time with family      Cognition   Overall Cognitive Status  Within Functional Limits for tasks assessed      Observation/Other Assessments   Neck Disability Index   32%      Sensation   Light Touch  Appears Intact      Posture/Postural Control   Posture Comments  mild forward head in sitting, standing, rounded shoulders      ROM / Strength   AROM / PROM / Strength  AROM;Strength      AROM   Overall AROM Comments  UE's WNL's shoulders all planes, cervical spine limited flexion 50%, extension 75% with pain, rotations limited 50% with pain; cervical retraction severe limitations with pain reproduced posterior cervical spine to right >left       Strength   Overall Strength Comments  UE's grossly major muslce groups WNL without reproduction of symptoms and equal bilaterally with exception of grip strength: right 25#, left 40# pre treatment; right grip strength improved to 40# following supine lying, ice and STM and manual traction/distraction     Palpation   Palpation comment  spasms and  tenderness along right cervical spine, upper trapezius and medial border of scapula      Spurling's   Findings  Negative    Side  Right bilateral      Distraction Test   Findngs  Negative    Comment  sitting disraction increased symptoms into bilateral shoulder/UE's; supine lying relieved symptoms      Ambulation/Gait   Gait Comments  decreased arm swing, guarded posture       Objective measurements completed on examination: See above findings.   Treatment: Manual therapy: 10 min. Goal: pain, spasms STM performed, with patient supine lying, to cervical spine paraspinal muscles and upper trapezius superficial technique, gentle manual traction/distraction with decreased symptoms reported into right UE  Ice pack applied to cervical spine with patient supine with towel roll under neck  Therapeutic exercise:  Sitting and standing: scapular retraction at door frame x 10 reps and instructed to perform throughout the day Use of traction for pain control and cervical collar for positioning during sitting activities  Patient response to treatment: patient demonstrated improved technique with exercises with minimal VC for correct alignment. Patient with decreased pain from   6/10 to <2/10 and improved grip strength right hand to 40# at end of session. Patient with decreased spasms by 50% following STM. Improved motor control with scapular retraction with  repetition and cuing     PT Education - 07/02/17 1900    Education provided  Yes    Education Details  POC; posture awareness and body mechanics for daily tasks; scapular retraction standing, sitting, lying with cervical roll for sleep; use of cervical collar for sitting at computer, traction if needed with home device; use of ice    Person(s) Educated  Patient    Methods  Explanation;Demonstration;Verbal cues;Handout    Comprehension  Verbalized understanding;Returned demonstration;Verbal cues required          PT Long Term Goals -  07/02/17 1843      PT LONG TERM GOAL #1   Title  Patient will improve NDI to 20% or less indicating improved functional use right UE and decreased symtpoms/ pain to mild wtih improvement in abiltiy to sleep without pain waking her    Baseline  NDI 32%    Status  New    Target Date  07/23/17  PT LONG TERM GOAL #2   Title  Patient will improve NDI to 10% or less indicating improved functional use right UE and decreased symtpoms to 0/10    Baseline  NDI 32%    Status  New    Target Date  08/13/17      PT LONG TERM GOAL #3   Title  Patient will demonstrate independence with posture awareness, pain control, exercises to allow transition to independence with self management of symptoms    Baseline  Patient requires guidance, insruction and cuing to perform exercises and progression    Status  New    Target Date  08/13/17             Plan - 07/02/17 1849    Clinical Impression Statement  Patient is a 52 year old right hand dominant female who presents with cervical radiculopathy x 1 month with worsening symptoms from neck into right hand. she has NDI score of 32% indicating moderate to severe self perceived impairment.  She has findings consistent with inflammation in cervical spine that responded well to positioning and cryotherapy. She has limited knowledge of appropriate posture awareness, pain control strategies or progression of exercise and will benefit from physical therapy intervention to achieve goals.    History and Personal Factors relevant to plan of care:  insideous onset of neck pain with progression to right UE over the past month; X rays with degenerative findings; active lifestyle limited due to this episode of pain     Clinical Presentation  Evolving    Clinical Presentation due to:  initially began pain in neck and now radiating into right UE to hand with pain and weakness     Clinical Decision Making  Low    Rehab Potential  Good    Clinical Impairments Affecting  Rehab Potential  (+)motivated, acute condition, age(-)degnerative changes in cervical spine per X ray report    PT Frequency  2x / week    PT Duration  6 weeks    PT Treatment/Interventions  Dry needling;Taping;Manual techniques;Neuromuscular re-education;Patient/family education;Therapeutic exercise;Iontophoresis 4mg /ml Dexamethasone;Moist Heat;Ultrasound;Electrical Stimulation;Cryotherapy;Traction    PT Next Visit Plan  pain control, soft tissue mobilization, progressive exercise as tolerated    PT Home Exercise Plan  posture awareness with activity, use of back support sitting, neck roll sleeping, cervical collar to control posture with sitting, scapular retraction, ice     Consulted and Agree with Plan of Care  Patient       Patient will benefit from skilled therapeutic intervention in order to improve the following deficits and impairments:  Decreased activity tolerance, Decreased range of motion, Decreased strength, Impaired perceived functional ability, Impaired UE functional use, Postural dysfunction, Increased muscle spasms, Pain  Visit Diagnosis: Other muscle spasm - Plan: PT plan of care cert/re-cert  Cervicalgia - Plan: PT plan of care cert/re-cert  Muscle weakness (generalized) - Plan: PT plan of care cert/re-cert     Problem List Patient Active Problem List   Diagnosis Date Noted  . Elevated ALT measurement 12/21/2016  . Generalized anxiety disorder 01/03/2016  . Concentration deficit 12/13/2015  . 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 Chronic fatigue 05/07/2014  . Dyspareunia, female 05/07/2014  . Constipation 11/20/2012  . Encounter for preventive health examination 11/20/2012  . Fibrocystic breast disease 09/24/2012  . History of abnormal cervical Pap smear 05/07/2012  . Neutropenia ([place]) 05/07/2012    Jomarie Longs PT 07/03/2017, 3:16 PM  [place] [facility] [address] [place], [address] Phone: [phone]   Fax:  [phone]  Name: Cynthia Houston MRN: [id number] Date of Birth: 1965-11-04

## 2017-07-08 ENCOUNTER — Ambulatory Visit: Payer: Managed Care, Other (non HMO) | Admitting: Physical Therapy

## 2017-07-08 ENCOUNTER — Encounter: Payer: Self-pay | Admitting: Physical Therapy

## 2017-07-08 DIAGNOSIS — M62838 Other muscle spasm: Secondary | ICD-10-CM | POA: Diagnosis not present

## 2017-07-08 DIAGNOSIS — M6281 Muscle weakness (generalized): Secondary | ICD-10-CM

## 2017-07-08 DIAGNOSIS — M542 Cervicalgia: Secondary | ICD-10-CM

## 2017-07-08 NOTE — Therapy (Signed)
[facility] [facility] [address], [place], [address] Phone: [phone]   Fax:  [phone]  Physical Therapy Treatment  Patient Details  Name: Cynthia Houston MRN: [id number] Date of Birth: 02-Mar-1966 Referring Provider: Owens Loffler MD   Encounter Date: 07/08/2017  PT End of Session - 07/08/17 1811    Visit Number  2    Number of Visits  12    Date for PT Re-Evaluation  08/13/17    PT Start Time  1721    PT Stop Time  1801    PT Time Calculation (min)  40 min    Activity Tolerance  Patient tolerated treatment well;Patient limited by pain    Behavior During Therapy  [facility] for tasks assessed/performed       Past Medical History:  Diagnosis Date  . Chicken pox   . Depression   . Diffuse cystic mastopathy 2013  . Headache(784.0)   . Tendinitis    right elbow  . Urinary tract infection     Past Surgical History:  Procedure Laterality Date  . ABDOMINAL HYSTERECTOMY  2008   supracervical   . BREAST CYST ASPIRATION Right    neg  . BUNIONECTOMY Right 2011  . COLONOSCOPY  2008   Dr. Vira Agar  . DIAGNOSTIC LAPAROSCOPY    . ECTOPIC PREGNANCY SURGERY  1989  . gynecological surgery   2004  . TUBAL LIGATION    . UTERINE FIBROID SURGERY  2002   x2 2004    There were no vitals filed for this visit.  Subjective Assessment - 07/08/17 1725    Subjective  Patient reports pain in neck better using cervical collar for sitting activities with good results of less right UE pain/symptoms.  She is having increased right UE symptoms today due to coming from work.   Limitations  Sitting;Reading;House hold activities    Patient Stated Goals  to get rid of pain and be able to exercise and perform activties without pain, sleep without pain    Currently in Pain?  Yes    Pain Score  6  end of the day; earlier was 4/10    Pain Orientation  Right    Pain Descriptors / Indicators  Aching;Tightness    Pain Type  Acute pain     Pain Radiating Towards  right arm and shoulder     Pain Onset  More than a month ago    Pain Frequency  Intermittent      Objective: Palpation: spasms and tenderness right sied cervical spine and upper trapezius muscles  Treatment:  Modalities: 8 min. Goal: pain, spasms Ultrasound: 1MHz pulsed 50% @ 1.2w/cm2 x 8 min. Right side cervical spine and upper trapezius with patient seated in massage chair   Manual therapy: 25 min.: goal improved soft tissue elasticity, decrease spasm and symptoms into right UE STM to cervical spine superficial techniques and compression techniques with patient seated massage chair and supine suboccipital release and manual cervical traction x 10 reps with relief of symptoms in right UE/ ice applied at end of session x 10 min. With patient supine with cervical roll under neck with minimal relief  Patient response to treatment: minimal relief of symptoms into right UE today, better with manual cervical traction; Patient understands home program for pain conrol and exercises.     PT Education - 07/08/17 1743    Education provided  Yes    Education Details  exercise instruction for technique; use of  ice to control pain, posture awareness and positioning to decrease right UE symptoms    Person(s) Educated  Patient    Methods  Explanation;Demonstration;Verbal cues    Comprehension  Verbalized understanding;Returned demonstration;Verbal cues required          PT Long Term Goals - 07/02/17 1843      PT LONG TERM GOAL #1   Title  Patient will improve NDI to 20% or less indicating improved functional use right UE and decreased symtpoms/ pain to mild wtih improvement in abiltiy to sleep without pain waking her    Baseline  NDI 32%    Status  New    Target Date  07/23/17      PT LONG TERM GOAL #2   Title  Patient will improve NDI to 10% or less indicating improved functional use right UE and decreased symtpoms to 0/10    Baseline  NDI 32%    Status  New     Target Date  08/13/17      PT LONG TERM GOAL #3   Title  Patient will demonstrate independence with posture awareness, pain control, exercises to allow transition to independence with self management of symptoms    Baseline  Patient requires guidance, insruction and cuing to perform exercises and progression    Status  New    Target Date  08/13/17            Plan - 07/08/17 1802    Clinical Impression Statement  Patient continues with cervical radiculopathy that responds to manual cervical traction with decreased right UE symptoms. Her pain is worse in the evening at the end of the day. She needs to be consistent with pain control, use of ice and posture awareness in order to see lasting results and will benefit from continued physical therapy intervention to achieve goals.     Rehab Potential  Good    Clinical Impairments Affecting Rehab Potential  (+)motivated, acute condition, age(-)degnerative changes in cervical spine per X ray report    PT Frequency  2x / week    PT Duration  6 weeks    PT Treatment/Interventions  Dry needling;Taping;Manual techniques;Neuromuscular re-education;Patient/family education;Therapeutic exercise;Iontophoresis 4mg /ml Dexamethasone;Moist Heat;Ultrasound;Electrical Stimulation;Cryotherapy;Traction    PT Next Visit Plan  pain control, soft tissue mobilization, progressive exercise as tolerated    PT Home Exercise Plan  posture awareness with activity, use of back support sitting, neck roll sleeping, cervical collar to control posture with sitting, scapular retraction, ice        Patient will benefit from skilled therapeutic intervention in order to improve the following deficits and impairments:  Decreased activity tolerance, Decreased range of motion, Decreased strength, Impaired perceived functional ability, Impaired UE functional use, Postural dysfunction, Increased muscle spasms, Pain  Visit Diagnosis: Other muscle spasm  Cervicalgia  Muscle  weakness (generalized)     Problem List Patient Active Problem List   Diagnosis Date Noted  . Elevated ALT measurement 12/21/2016  . Generalized anxiety disorder 01/03/2016  . Concentration deficit 12/13/2015  . 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 Chronic fatigue 05/07/2014  . Dyspareunia, female 05/07/2014  . Constipation 11/20/2012  . Encounter for preventive health examination 11/20/2012  . Fibrocystic breast disease 09/24/2012  . History of abnormal cervical Pap smear 05/07/2012  . Neutropenia ([place]) 05/07/2012    Jomarie Longs PT 07/09/2017, 9:42 AM  [place] [facility] [address] [place], [address] Phone: [phone]   Fax:  [phone]  Name: Cynthia Houston MRN: [id number] Date of Birth: 1966/03/04

## 2017-07-10 ENCOUNTER — Encounter: Payer: Self-pay | Admitting: Physical Therapy

## 2017-07-10 ENCOUNTER — Ambulatory Visit: Payer: Managed Care, Other (non HMO) | Admitting: Physical Therapy

## 2017-07-10 DIAGNOSIS — M6281 Muscle weakness (generalized): Secondary | ICD-10-CM

## 2017-07-10 DIAGNOSIS — M542 Cervicalgia: Secondary | ICD-10-CM

## 2017-07-10 DIAGNOSIS — M62838 Other muscle spasm: Secondary | ICD-10-CM

## 2017-07-10 NOTE — Therapy (Signed)
[place] [facility] [address] [place], [address] Phone: [phone]   Fax:  [phone]  Physical Therapy Treatment  Patient Details  Name: Cynthia Houston MRN: [id number] Date of Birth: June 29, 1965 Referring Provider: Owens Loffler MD   Encounter Date: 07/10/2017  PT End of Session - 07/10/17 1741    Visit Number  3    Number of Visits  12    Date for PT Re-Evaluation  08/13/17    PT Start Time  4174    PT Stop Time  1828    PT Time Calculation (min)  53 min    Activity Tolerance  Patient tolerated treatment well;Patient limited by pain    Behavior During Therapy  [facility] for tasks assessed/performed       Past Medical History:  Diagnosis Date  . Chicken pox   . Depression   . Diffuse cystic mastopathy 2013  . Headache(784.0)   . Tendinitis    right elbow  . Urinary tract infection     Past Surgical History:  Procedure Laterality Date  . ABDOMINAL HYSTERECTOMY  2008   supracervical   . BREAST CYST ASPIRATION Right    neg  . BUNIONECTOMY Right 2011  . COLONOSCOPY  2008   Dr. Vira Agar  . DIAGNOSTIC LAPAROSCOPY    . ECTOPIC PREGNANCY SURGERY  1989  . gynecological surgery   2004  . TUBAL LIGATION    . UTERINE FIBROID SURGERY  2002   x2 2004    There were no vitals filed for this visit.  Subjective Assessment - 07/10/17 1740    Subjective  Patient reports pain in neck better using cervical collar for sitting activities with good results of less right UE pain/symptoms.     Limitations  Sitting;Reading;House hold activities    Patient Stated Goals  to get rid of pain and be able to exercise and perform activties without pain, sleep without pain    Currently in Pain?  Yes    Pain Score  6     Pain Orientation  Right    Pain Descriptors / Indicators  Aching;Tightness    Pain Type  Acute pain    Pain Onset  More than a month ago    Pain Frequency  Intermittent           Objective: Palpation: spasms and tenderness right side cervical spine and upper trapezius muscles Grip strength at end of session: right 42#, left 45#  Treatment:  Modalities: 10 min. Goal: pain, spasms Ultrasound/electrical stim combination: 1MHz pulsed 50% @ 1.2w/cm2 with HV estim intensity 85volts x 10 min. Right side cervical spine and trapezius with patient seated in massage chair   Manual therapy: 25 min.: goal improved soft tissue elasticity, decrease spasm and symptoms into right UE STM to cervical spine superficial techniques and compression techniques with patient  supine; suboccipital release and manual cervical traction x 10 reps with relief of symptoms in right UE/ ice applied at end of session x 10 min. With patient supine with cervical roll under neck   Therapeutic exercise: patient performed with instruction, assistance, verbal/tactile cues of therapist: goal: pain, improve NDI  Supine with head supported on pillow with cervical roll; patient performed cervical retraction with guidance and assisted positioning of head/neck into flexion as needed to decrease symptoms radiating into right UE: patient reported increased symptoms with neck in neutral position, less with flexion (no real relief with retraction exercises)  Patient response to treatment:  Patient demonstrated relief of symptoms into right UE to 0/10 at end of session. Patient understands home program for pain conrol and exercises.      PT Education - 07/10/17 1844    Education provided  Yes    Education Details  continue with posture control and use of ice to control symptoms    Person(s) Educated  Patient    Methods  Explanation    Comprehension  Verbalized understanding          PT Long Term Goals - 07/02/17 1843      PT LONG TERM GOAL #1   Title  Patient will improve NDI to 20% or less indicating improved functional use right UE and decreased symtpoms/ pain to mild wtih improvement in abiltiy to  sleep without pain waking her    Baseline  NDI 32%    Status  New    Target Date  07/23/17      PT LONG TERM GOAL #2   Title  Patient will improve NDI to 10% or less indicating improved functional use right UE and decreased symtpoms to 0/10    Baseline  NDI 32%    Status  New    Target Date  08/13/17      PT LONG TERM GOAL #3   Title  Patient will demonstrate independence with posture awareness, pain control, exercises to allow transition to independence with self management of symptoms    Baseline  Patient requires guidance, insruction and cuing to perform exercises and progression    Status  New    Target Date  08/13/17            Plan - 07/10/17 1743    Clinical Impression Statement  Patient demonstrates improvement with less pain the day following last therapy session and today 0/10 right UE symptoms at end of session.  She continues with primary limitations of posture control with work and inflammation/spasms.    Rehab Potential  Good    Clinical Impairments Affecting Rehab Potential  (+)motivated, acute condition, age(-)degnerative changes in cervical spine per X ray report    PT Frequency  2x / week    PT Duration  6 weeks    PT Treatment/Interventions  Dry needling;Taping;Manual techniques;Neuromuscular re-education;Patient/family education;Therapeutic exercise;Iontophoresis 4mg /ml Dexamethasone;Moist Heat;Ultrasound;Electrical Stimulation;Cryotherapy;Traction    PT Next Visit Plan  pain control, soft tissue mobilization, progressive exercise as tolerated    PT Home Exercise Plan  posture awareness with activity, use of back support sitting, neck roll sleeping, cervical collar to control posture with sitting, scapular retraction, ice        Patient will benefit from skilled therapeutic intervention in order to improve the following deficits and impairments:  Decreased activity tolerance, Decreased range of motion, Decreased strength, Impaired perceived functional ability,  Impaired UE functional use, Postural dysfunction, Increased muscle spasms, Pain  Visit Diagnosis: Other muscle spasm  Cervicalgia  Muscle weakness (generalized)     Problem List Patient Active Problem List   Diagnosis Date Noted  . Elevated ALT measurement 12/21/2016  . Generalized anxiety disorder 01/03/2016  . Concentration deficit 12/13/2015  . 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 Chronic fatigue 05/07/2014  . Dyspareunia, female 05/07/2014  . Constipation 11/20/2012  . Encounter for preventive health examination 11/20/2012  . Fibrocystic breast disease 09/24/2012  . History of abnormal cervical Pap smear 05/07/2012  . Neutropenia ([place]) 05/07/2012    Jomarie Longs PT 07/10/2017, 6:45 PM  [place] [facility] [address] [place], [address] Phone: [phone]   Fax:  [phone]  Name: TIPPI MCCRAE MRN: [id number] Date of Birth: 09/27/1965

## 2017-07-15 ENCOUNTER — Ambulatory Visit: Payer: Managed Care, Other (non HMO) | Admitting: Physical Therapy

## 2017-07-15 ENCOUNTER — Encounter: Payer: Self-pay | Admitting: Physical Therapy

## 2017-07-15 DIAGNOSIS — M62838 Other muscle spasm: Secondary | ICD-10-CM

## 2017-07-15 DIAGNOSIS — M6281 Muscle weakness (generalized): Secondary | ICD-10-CM

## 2017-07-15 DIAGNOSIS — M542 Cervicalgia: Secondary | ICD-10-CM

## 2017-07-16 NOTE — Therapy (Signed)
[facility] [address] [place], [address] Phone: [phone]   Fax:  [phone]  Physical Therapy Treatment  Patient Details  Name: Cynthia Houston MRN: [id number] Date of Birth: [birth year] Referring Provider: Owens Loffler MD   Encounter Date: 07/15/2017  PT End of Session - 07/15/17 1739    Visit Number  4    Number of Visits  12    Date for PT Re-Evaluation  08/13/17    PT Start Time  9024    PT Stop Time  1808    PT Time Calculation (min)  33 min    Activity Tolerance  Patient tolerated treatment well;Patient limited by pain    Behavior During Therapy  [facility] for tasks assessed/performed       Past Medical History:  Diagnosis Date  . Chicken pox   . Depression   . Diffuse cystic mastopathy 2013  . Headache(784.0)   . Tendinitis    right elbow  . Urinary tract infection     Past Surgical History:  Procedure Laterality Date  . ABDOMINAL HYSTERECTOMY  2008   supracervical   . BREAST CYST ASPIRATION Right    neg  . BUNIONECTOMY Right 2011  . COLONOSCOPY  2008   Dr. Vira Agar  . DIAGNOSTIC LAPAROSCOPY    . ECTOPIC PREGNANCY SURGERY  1989  . gynecological surgery   2004  . TUBAL LIGATION    . UTERINE FIBROID SURGERY  2002   x2 2004    There were no vitals filed for this visit.  Subjective Assessment - 07/15/17 1737    Subjective  Patient reports she is still having pain in neck and is better with using cervical collar for cleaning.  Symptoms are worse at end of work day due to prolonged sitting at computer   Limitations  Sitting;Reading;House hold activities    Patient Stated Goals  to get rid of pain and be able to exercise and perform activties without pain, sleep without pain    Currently in Pain?  Yes    Pain Score  6     Pain Location  Neck    Pain Orientation  Right    Pain Descriptors / Indicators  Aching    Pain Type  Acute pain    Pain Radiating Towards  right shoulder,  scapula and arm    Pain Onset  More than a month ago    Pain Frequency  Intermittent         Objective: Palpation: spasms and tenderness right side cervical spine and upper trapezius muscles   Treatment:  Modalities: 10 min. Goal: pain, spasms Ultrasound/electrical stim combination:1MHz pulsed 50% @ 1.2w/cm2with HV estim intensity 85volts x 10 min. Right side cervical spine and trapezius with patient seated in massage chair  Manual therapy: 15 min.: goal improved soft tissue elasticity, decrease spasm and symptoms into right UE STM tocervical spinesuperficial techniques and compression techniques with patient supine; suboccipital release and manualcervical traction x 10 reps with relief of symptoms in rightUE/ ice applied at end of sessionx 8 min. With patient supine with cervical roll under neck  Patient response to treatment:  Patient demonstrated relief of symptoms into right UE to 0/10 at end of session. Patient verbalized good understanding of positioning to avoid increased right sided neck symptoms         PT Education - 07/15/17 1738    Education provided  Yes    Education Details  re assessed  home program for posture, exercise    Person(s) Educated  Patient    Methods  Explanation;Demonstration;Verbal cues    Comprehension  Verbalized understanding;Returned demonstration;Verbal cues required          PT Long Term Goals - 07/02/17 1843      PT LONG TERM GOAL #1   Title  Patient will improve NDI to 20% or less indicating improved functional use right UE and decreased symtpoms/ pain to mild wtih improvement in abiltiy to sleep without pain waking her    Baseline  NDI 32%    Status  New    Target Date  07/23/17      PT LONG TERM GOAL #2   Title  Patient will improve NDI to 10% or less indicating improved functional use right UE and decreased symtpoms to 0/10    Baseline  NDI 32%    Status  New    Target Date  08/13/17      PT LONG TERM GOAL #3    Title  Patient will demonstrate independence with posture awareness, pain control, exercises to allow transition to independence with self management of symptoms    Baseline  Patient requires guidance, insruction and cuing to perform exercises and progression    Status  New    Target Date  08/13/17            Plan - 07/15/17 1802    Clinical Impression Statement  Patient demonstrated decreased pain and is progressing steadily towards goals. Pain is primary limting factor for function with daily tasks.     Rehab Potential  Good    Clinical Impairments Affecting Rehab Potential  (+)motivated, acute condition, age(-)degnerative changes in cervical spine per X ray report    PT Frequency  2x / week    PT Duration  6 weeks    PT Treatment/Interventions  Dry needling;Taping;Manual techniques;Neuromuscular re-education;Patient/family education;Therapeutic exercise;Iontophoresis 4mg /ml Dexamethasone;Moist Heat;Ultrasound;Electrical Stimulation;Cryotherapy;Traction    PT Next Visit Plan  pain control, soft tissue mobilization, progressive exercise as tolerated    PT Home Exercise Plan  posture awareness with activity, use of back support sitting, neck roll sleeping, cervical collar to control posture with sitting, scapular retraction, ice        Patient will benefit from skilled therapeutic intervention in order to improve the following deficits and impairments:  Decreased activity tolerance, Decreased range of motion, Decreased strength, Impaired perceived functional ability, Impaired UE functional use, Postural dysfunction, Increased muscle spasms, Pain  Visit Diagnosis: Other muscle spasm  Cervicalgia  Muscle weakness (generalized)     Problem List Patient Active Problem List   Diagnosis Date Noted  . Elevated ALT measurement 12/21/2016  . Generalized anxiety disorder 01/03/2016  . Concentration deficit 12/13/2015  . 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  Chronic fatigue 05/07/2014  . Dyspareunia, female 05/07/2014  . Constipation 11/20/2012  . Encounter for preventive health examination 11/20/2012  . Fibrocystic breast disease 09/24/2012  . History of abnormal cervical Pap smear 05/07/2012  . Neutropenia ([place]) 05/07/2012    Jomarie Longs PT 07/16/2017, 10:06 PM  [facility] [address], [place], [address] Phone: [phone]   Fax:  [phone]  Name: Cynthia Houston MRN: [id number] Date of Birth: 08-21-1965

## 2017-07-17 ENCOUNTER — Ambulatory Visit: Payer: Managed Care, Other (non HMO) | Admitting: Physical Therapy

## 2017-07-18 ENCOUNTER — Other Ambulatory Visit: Payer: Self-pay | Admitting: Unknown Physician Specialty

## 2017-07-18 DIAGNOSIS — M50122 Cervical disc disorder at C5-C6 level with radiculopathy: Secondary | ICD-10-CM | POA: Insufficient documentation

## 2017-07-18 DIAGNOSIS — M5090 Cervical disc disorder, unspecified, unspecified cervical region: Secondary | ICD-10-CM

## 2017-07-22 ENCOUNTER — Ambulatory Visit: Payer: Managed Care, Other (non HMO) | Admitting: Physical Therapy

## 2017-07-23 ENCOUNTER — Ambulatory Visit
Admission: RE | Admit: 2017-07-23 | Discharge: 2017-07-23 | Disposition: A | Discharge status: Home / Self Care | Payer: Managed Care, Other (non HMO) | Source: Ambulatory Visit | Attending: Unknown Physician Specialty | Admitting: Unknown Physician Specialty

## 2017-07-24 ENCOUNTER — Ambulatory Visit: Payer: Managed Care, Other (non HMO) | Admitting: Physical Therapy

## 2017-07-29 ENCOUNTER — Encounter: Payer: Managed Care, Other (non HMO) | Admitting: Physical Therapy

## 2017-07-30 ENCOUNTER — Ambulatory Visit: Payer: Managed Care, Other (non HMO)

## 2017-07-31 ENCOUNTER — Encounter: Payer: Managed Care, Other (non HMO) | Admitting: Physical Therapy

## 2017-08-05 ENCOUNTER — Encounter: Payer: Managed Care, Other (non HMO) | Admitting: Physical Therapy

## 2017-08-07 ENCOUNTER — Encounter: Payer: Managed Care, Other (non HMO) | Admitting: Physical Therapy

## 2017-08-08 ENCOUNTER — Ambulatory Visit: Payer: Managed Care, Other (non HMO) | Admitting: Family Medicine

## 2017-08-08 ENCOUNTER — Ambulatory Visit
Admission: RE | Admit: 2017-08-08 | Discharge: 2017-08-08 | Disposition: A | Discharge status: Home / Self Care | Payer: Managed Care, Other (non HMO) | Source: Ambulatory Visit | Attending: Unknown Physician Specialty | Admitting: Unknown Physician Specialty

## 2017-08-08 DIAGNOSIS — M5020 Other cervical disc displacement, unspecified cervical region: Secondary | ICD-10-CM | POA: Insufficient documentation

## 2017-08-08 DIAGNOSIS — M5090 Cervical disc disorder, unspecified, unspecified cervical region: Secondary | ICD-10-CM

## 2017-08-08 DIAGNOSIS — M25511 Pain in right shoulder: Secondary | ICD-10-CM | POA: Insufficient documentation

## 2017-08-08 DIAGNOSIS — M47812 Spondylosis without myelopathy or radiculopathy, cervical region: Secondary | ICD-10-CM | POA: Diagnosis not present

## 2017-09-25 ENCOUNTER — Encounter: Payer: Self-pay | Admitting: Internal Medicine

## 2017-09-25 ENCOUNTER — Ambulatory Visit: Payer: Managed Care, Other (non HMO) | Admitting: Internal Medicine

## 2017-09-25 DIAGNOSIS — B9789 Other viral agents as the cause of diseases classified elsewhere: Secondary | ICD-10-CM

## 2017-09-25 DIAGNOSIS — J069 Acute upper respiratory infection, unspecified: Secondary | ICD-10-CM | POA: Diagnosis not present

## 2017-09-25 MED ORDER — LEVOFLOXACIN 500 MG PO TABS: 500.0000 mg | ORAL_TABLET | Freq: Every day | ORAL | 0 refills | Status: DC

## 2017-09-25 NOTE — Progress Notes (Signed)
Subjective:  Patient ID: Cynthia Houston, female    DOB: 07/13/1965  Age: 52 y.o. MRN: [id number]  CC: The encounter diagnosis was Viral URI with cough.  HPI Cynthia Houston presents for right ear pain for 3 days.  Some cough,  Scratchy throat   Rhinitis  No fevers or body aches .  No recent travel ,  No sick contacts    Outpatient Medications Prior to Visit  Medication Sig Dispense Refill  . Cyanocobalamin (VITAMIN B-12) 5000 MCG SUBL Place 1 tablet under the tongue daily.    . Multiple Vitamin (MULTIVITAMIN) tablet Take 1 tablet by mouth daily.    . sertraline (ZOLOFT) 50 MG tablet Take 1 tablet (50 mg total) by mouth daily. 90 tablet 3  . predniSONE (DELTASONE) 20 MG tablet 2 tabs po for 5 days, then 1 po for 5 days (Patient not taking: Reported on 09/25/2017) 15 tablet 0   No facility-administered medications prior to visit.     Review of Systems;  Patient denies headache, fevers, malaise, unintentional weight loss, skin rash, eye pain, sinus congestion and sinus pain, sore throat, dysphagia,  hemoptysis , cough, dyspnea, wheezing, chest pain, palpitations, orthopnea, edema, abdominal pain, nausea, melena, diarrhea, constipation, flank pain, dysuria, hematuria, urinary  Frequency, nocturia, numbness, tingling, seizures,  Focal weakness, Loss of consciousness,  Tremor, insomnia, depression, anxiety, and suicidal ideation.      Objective:  BP 110/66 (BP Location: Left Arm, Patient Position: Sitting, Cuff Size: Normal)   Pulse 67   Temp 98.5 F (36.9 C) (Oral)   Resp 14   Ht 5' 3.5" (1.613 m)   Wt 120 lb (54.4 kg)   SpO2 98%   BMI 20.92 kg/m   BP Readings from Last 3 Encounters:  09/25/17 110/66  06/27/17 110/70  03/05/17 119/72    Wt Readings from Last 3 Encounters:  09/25/17 120 lb (54.4 kg)  06/27/17 123 lb 12 oz (56.1 kg)  03/05/17 120 lb 9.6 oz (54.7 kg)    General appearance: alert, cooperative and appears stated age Ears: normal TM's and external ear canals  both ears Throat: lips, mucosa, and tongue normal; teeth and gums normal Neck: no adenopathy, no carotid bruit, supple, symmetrical, trachea midline and thyroid not enlarged, symmetric, no tenderness/mass/nodules Back: symmetric, no curvature. ROM normal. No CVA tenderness. Lungs: clear to auscultation bilaterally Heart: regular rate and rhythm, S1, S2 normal, no murmur, click, rub or gallop Abdomen: soft, non-tender; bowel sounds normal; no masses,  no organomegaly Pulses: 2+ and symmetric Skin: Skin color, texture, turgor normal. No rashes or lesions Lymph nodes: Cervical, supraclavicular, and axillary nodes normal.  Lab Results  Component Value Date   HGBA1C 5.6 12/19/2016   HGBA1C 5.5 01/03/2016   HGBA1C 5.9 05/08/2014    Lab Results  Component Value Date   CREATININE 0.75 12/19/2016   CREATININE 0.81 12/15/2015   CREATININE 0.8 12/17/2014    Lab Results  Component Value Date   WBC 4.2 12/19/2016   HGB 13.6 12/19/2016   HCT 40.3 12/19/2016   PLT 235 12/19/2016   GLUCOSE 91 12/19/2016   CHOL 174 12/19/2016   TRIG 44 12/19/2016   HDL 82 12/19/2016   LDLCALC 83 12/19/2016   ALT 47 (H) 03/06/2017   AST 33 03/06/2017   NA 140 12/19/2016   K 4.5 12/19/2016   CL 102 12/19/2016   CREATININE 0.75 12/19/2016   BUN 17 12/19/2016   CO2 25 12/19/2016   TSH 1.790 12/19/2016  HGBA1C 5.6 12/19/2016     Assessment & Plan:   Problem List Items Addressed This Visit    Viral URI with cough    HEENT exam is normal.  History suggestive of allergic rhinitis leading to Eustachian tube dysfunction.Prednisone taper, benadryl qhs for PND, and daytime 2nd generation antihistamine with decongestant . Antibiotic for useif complicating symptoms develop.          I have discontinued Cynthia Houston. [place]'s predniSONE. I am also having her start on levofloxacin. Additionally, I am having her maintain her sertraline, multivitamin, and Vitamin B-12.  Meds ordered this encounter    Medications  . levofloxacin (LEVAQUIN) 500 MG tablet    Sig: Take 1 tablet (500 mg total) by mouth daily.    Dispense:  7 tablet    Refill:  0    Medications Discontinued During This Encounter  Medication Reason  . predniSONE (DELTASONE) 20 MG tablet Completed Course    Follow-up: No follow-ups on file.   Crecencio Mc, MD

## 2017-09-25 NOTE — Patient Instructions (Addendum)
Your ears look fine  No signs of infection .  The pain may be from swollen Eustachian tubes due to allergies,  Viruses  Etc   I recommend the following treatment:  1) Benadryl  25 mg once daily in evening ( by 9 pm) for the post nasal drip   2) Prednisone taper starting tomorrow  In the am   3) Daytime antihistamine (pick one)   generic zyrtec, which is cetirizine.  Allegra is available  generically as fexofenadine and it comes in 60 mg and 180 mg once daily strengths.  The claritin is also available generically as loratidine .    4) Add the Levaquin IF YOU DEVELOP :  Fever,  Worsening ear pain,  Or sore throat  Taking an antibiotic can create an imbalance in the normal population of bacteria that live in the small intestine.  This imbalance can persist for 3 months.   Taking a probiotic ( Align, Floraque or Culturelle), the generic version of one of these over the counter medications, or an alternative form (kombucha,  Yogurt, or another dietary source) for a minimum of 3 weeks may help prevent a serious antibiotic associated diarrhea  Called clostridium dificile colitis that occurs when the bacteria population is altered .  Taking a probiotic may also prevent vaginitis due to yeast infections and can be continued indefinitely if you feel that it improves your digestion or your elimination (bowels).

## 2017-09-26 ENCOUNTER — Telehealth: Payer: Self-pay | Admitting: Internal Medicine

## 2017-09-26 DIAGNOSIS — B9789 Other viral agents as the cause of diseases classified elsewhere: Principal | ICD-10-CM

## 2017-09-26 DIAGNOSIS — J069 Acute upper respiratory infection, unspecified: Secondary | ICD-10-CM | POA: Insufficient documentation

## 2017-09-26 MED ORDER — PREDNISONE 10 MG PO TABS: ORAL_TABLET | ORAL | 0 refills | Status: DC

## 2017-09-26 NOTE — Telephone Encounter (Signed)
Called Levaquin script into [facility]  But [facility] also says to start a prednisone taper Please advise I can call in for PCP.

## 2017-09-26 NOTE — Telephone Encounter (Signed)
Yes,  prednsioen taper sent now

## 2017-09-26 NOTE — Assessment & Plan Note (Signed)
HEENT exam is normal.  History suggestive of allergic rhinitis leading to Eustachian tube dysfunction.Prednisone taper, benadryl qhs for PND, and daytime 2nd generation antihistamine with decongestant . Antibiotic for useif complicating symptoms develop.

## 2017-12-11 ENCOUNTER — Other Ambulatory Visit: Payer: Self-pay | Admitting: Internal Medicine

## 2017-12-13 ENCOUNTER — Encounter: Payer: Self-pay | Admitting: Internal Medicine

## 2017-12-13 ENCOUNTER — Ambulatory Visit (INDEPENDENT_AMBULATORY_CARE_PROVIDER_SITE_OTHER): Payer: Managed Care, Other (non HMO) | Admitting: Internal Medicine

## 2017-12-13 VITALS — BP 100/66 | HR 62 | Temp 98.2°F | Resp 14 | Ht 63.5 in | Wt 120.4 lb

## 2017-12-13 DIAGNOSIS — R002 Palpitations: Secondary | ICD-10-CM | POA: Diagnosis not present

## 2017-12-13 DIAGNOSIS — F411 Generalized anxiety disorder: Secondary | ICD-10-CM | POA: Diagnosis not present

## 2017-12-13 DIAGNOSIS — Z0000 Encounter for general adult medical examination without abnormal findings: Secondary | ICD-10-CM | POA: Diagnosis not present

## 2017-12-13 DIAGNOSIS — Z1231 Encounter for screening mammogram for malignant neoplasm of breast: Secondary | ICD-10-CM | POA: Diagnosis not present

## 2017-12-13 DIAGNOSIS — Z87898 Personal history of other specified conditions: Secondary | ICD-10-CM

## 2017-12-13 DIAGNOSIS — Z1239 Encounter for other screening for malignant neoplasm of breast: Secondary | ICD-10-CM

## 2017-12-13 DIAGNOSIS — Z8742 Personal history of other diseases of the female genital tract: Secondary | ICD-10-CM

## 2017-12-13 MED ORDER — SERTRALINE HCL 50 MG PO TABS: 50.0000 mg | ORAL_TABLET | Freq: Every day | ORAL | 1 refills | Status: DC

## 2017-12-13 NOTE — Patient Instructions (Signed)
Supraventricular Tachycardia, Adult °Supraventricular tachycardia (SVT) is a kind of abnormal heartbeat. It makes your heart beat very fast and then beat at a normal speed. °A normal heart beats 60-100 times a minute. This condition can make your heart beat more than 150 times a minute. Times of having a fast heartbeat (episodes) can be scary, but they are usually not dangerous. They can lead to problems if: °· They happen often. °· They last a long time. °Symptoms of this condition include: °· A pounding heart. °· A feeling that your heart is skipping beats (palpitations). °· Weakness. °· Trouble getting enough air (shortness of breath). °· Pain or tightness in your chest. °· Feeling like you are going to pass out (light-headedness). °· Feeling worried or nervous (anxiety). °· Dizziness. °· Sweating. °· Feeling sick to your stomach (nausea). °· Passing out (fainting). °· Tiredness. °Sometimes, there are no symptoms. °Follow these instructions at home: °Stress  °· Avoid things that make you feel stressed. °· Find out what helps you feel less stressed. Try: °¨ Doing a relaxing activity, like yoga, meditation, or being out in nature. °¨ Listening to relaxing music. °¨ Doing relaxation techniques, like deep breathing. °¨ Taking steps to be healthy. These include getting lots of sleep, exercising, and eating a balanced diet. °¨ Talking with a mental health doctor. °Sleep  °· Try to get at least 7 hours of sleep each night. °Tobacco and nicotine  °· Do not use anything that has nicotine or tobacco, such as cigarettes and e-cigarettes. If you need help quitting, ask your doctor. °Alcohol  °· If alcohol gives you a fast heartbeat, do not drink alcohol. °· If alcohol does not seem to give you a fast heartbeat, limit your alcohol. For nonpregnant women, this means no more than 1 drink a day. For men, this means no more than 2 drinks a day. "One drink" means one of these: °¨ 12 oz of beer. °¨ 5 oz of wine. °¨ 1½ oz of hard  liquor. °Caffeine  °· If caffeine gives you a fast heartbeat, do not eat, drink, or use anything with caffeine in it. °· If caffeine does not seem to give you a fast heartbeat, limit how much caffeine you eat, drink, or use. °Stimulant drugs  °· Do not use stimulant drugs. These are drugs like cocaine or methamphetamine. If you need help quitting, ask your doctor. °General instructions  °· Stay at a healthy weight. °· Exercise regularly. Ask your doctor to suggest some good activities for you. Try one of these options: °¨ 150 minutes a week of gentle exercise, like walking or yoga. °¨ 75 minutes a week of exercise that is very active, like running or swimming. °¨ A combination of gentle exercise and very active exercise. °· Do home treatments to slow down your heartbeat as told by your doctor. °· Take over-the-counter and prescription medicines only as told by your doctor. °Contact a doctor if: °· You have a fast heartbeat more often. °· Times of having a fast heartbeat last longer than before. °· Your home treatments to slow down your heartbeat do not help. °· You have new symptoms. °Get help right away if: °· You have chest pain. °· Your symptoms get worse. °· You have trouble breathing. °· Your heart beats very fast for more than 20 minutes. °· You pass out (faint). °These symptoms may be an emergency. Do not wait to see if the symptoms will go away. Get medical help right away. Call your   local emergency services (911 in the [place] Do not drive yourself to the hospital. °This information is not intended to replace advice given to you by your health care provider. Make sure you discuss any questions you have with your health care provider. °Document Released: 06/11/2005 Document Revised: 02/16/2016 Document Reviewed: 02/16/2016 °[employer]vier Interactive Patient Education © 2017 [employer] ° °

## 2017-12-13 NOTE — Progress Notes (Signed)
Patient ID: Cynthia Houston, female    DOB: 07-12-1965  Age: 52 y.o. MRN: [id number]  The patient is here for annual preventive examination and management of other chronic and acute problems.   The risk factors are reflected in the social history.  The roster of all physicians providing medical care to patient - is listed in the Snapshot section of the chart.  Activities of daily living:  The patient is 100% independent in all ADLs: dressing, toileting, feeding as well as independent mobility  Home safety : The patient has smoke detectors in the home. They wear seatbelts.  There are no firearms at home. There is no violence in the home.   There is no risks for hepatitis, STDs or HIV. There is no   history of blood transfusion. They have no travel history to infectious disease endemic areas of the world.  The patient has seen their dentist in the last six month. They have seen their eye doctor in the last year.    They do not  have excessive sun exposure. Discussed the need for sun protection: hats, long sleeves and use of sunscreen if there is significant sun exposure.   Diet: the importance of a healthy diet is discussed. They do have a healthy diet.  The benefits of regular aerobic exercise were discussed. She exercises vigorously 5 times per week ,  60 minutes.   Depression screen: there are no signs or vegative symptoms of depression- irritability, change in appetite, anhedonia, sadness/tearfullness.   The following portions of the patient's history were reviewed and updated as appropriate: allergies, current medications, past family history, past medical history,  past surgical history, past social history  and problem list.  Visual acuity was not assessed per patient preference since she has regular follow up with her ophthalmologist. Hearing and body mass index were assessed and reviewed.   During the course of the visit the patient was educated and counseled about appropriate  screening and preventive services including : fall prevention , diabetes screening, nutrition counseling, colorectal cancer screening, and recommended immunizations.    CC: The primary encounter diagnosis was Encounter for preventive health examination. Diagnoses of Palpitations, Breast cancer screening, Generalized anxiety disorder, History of abnormal cervical Pap smear, and Intermittent palpitations were also pertinent to this visit.   Recurrent episodes of 'palpitations' occurring during exercise and at rest.  Last < 1 hours,  No diaphoresis,  Chest pain or nausea  Some dizziness but no true vertigo.    History Cynthia Houston has a past medical history of Chicken pox, Depression, Diffuse cystic mastopathy (2013), Headache(784.0), Tendinitis, and Urinary tract infection.   She has a past surgical history that includes Ectopic pregnancy surgery (1989); Uterine fibroid surgery (2002); Abdominal hysterectomy (2008); gynecological surgery  (2004); Diagnostic laparoscopy; Tubal ligation; Bunionectomy (Right, 2011); Colonoscopy (2008); and Breast cyst aspiration (Right).   Her family history includes Arthritis in her mother; Crohn's disease in her father; Heart disease (age of onset: 58) in her father; Hyperlipidemia in her father and mother; Hypertension in her father and mother.She reports that she has never smoked. She has never used smokeless tobacco. She reports that she drinks alcohol. She reports that she does not use drugs.  Outpatient Medications Prior to Visit  Medication Sig Dispense Refill  . Cyanocobalamin (VITAMIN B-12) 5000 MCG SUBL Place 1 tablet under the tongue daily.    . Multiple Vitamin (MULTIVITAMIN) tablet Take 1 tablet by mouth daily.    . sertraline (ZOLOFT) 50 MG tablet  TAKE ONE TABLET DAILY 90 tablet 1  . levofloxacin (LEVAQUIN) 500 MG tablet Take 1 tablet (500 mg total) by mouth daily. (Patient not taking: Reported on 12/13/2017) 7 tablet 0  . predniSONE (DELTASONE) 10 MG tablet  6 tablets on Day 1 , then reduce by 1 tablet daily until gone (Patient not taking: Reported on 12/13/2017) 21 tablet 0   No facility-administered medications prior to visit.     Review of Systems   Patient denies headache, fevers, malaise, unintentional weight loss, skin rash, eye pain, sinus congestion and sinus pain, sore throat, dysphagia,  hemoptysis , cough, dyspnea, wheezing, chest pain, orthopnea, edema, abdominal pain, nausea, melena, diarrhea, constipation, flank pain, dysuria, hematuria, urinary  Frequency, nocturia, numbness, tingling, seizures,  Focal weakness, Loss of consciousness,  Tremor, insomnia, depression, anxiety, and suicidal ideation.      Objective:  BP 100/66 (BP Location: Left Arm, Patient Position: Sitting, Cuff Size: Normal)   Pulse 62   Temp 98.2 F (36.8 C) (Oral)   Resp 14   Ht 5' 3.5" (1.613 m)   Wt 120 lb 6.4 oz (54.6 kg)   SpO2 98%   BMI 20.99 kg/m   Physical Exam   General appearance: alert, cooperative and appears stated age Head: Normocephalic, without obvious abnormality, atraumatic Eyes: conjunctivae/corneas clear. PERRL, EOM's intact. Fundi benign. Ears: normal TM's and external ear canals both ears Nose: Nares normal. Septum midline. Mucosa normal. No drainage or sinus tenderness. Throat: lips, mucosa, and tongue normal; teeth and gums normal Neck: no adenopathy, no carotid bruit, no JVD, supple, symmetrical, trachea midline and thyroid not enlarged, symmetric, no tenderness/mass/nodules Lungs: clear to auscultation bilaterally Breasts: normal appearance, no masses or tenderness Heart: regular rate and rhythm, S1, S2 normal, no murmur, click, rub or gallop Abdomen: soft, non-tender; bowel sounds normal; no masses,  no organomegaly Extremities: extremities normal, atraumatic, no cyanosis or edema Pulses: 2+ and symmetric Skin: Skin color, texture, turgor normal. No rashes or lesions Neurologic: Alert and oriented X 3, normal strength and  tone. Normal symmetric reflexes. Normal coordination and gait.      Assessment & Plan:   Problem List Items Addressed This Visit    Intermittent palpitations    Occurring both at rest and during exercise. I have reviewed an EKG done today and other than sinus bradycardia, it appears normal.    Screening labs ordered ([employer] employee) ; if normal, will refer to cardiology for Holter monitor.  baslien bp is too low to start beta blocker.       History of abnormal cervical Pap smear    Repeat PAP due in 2020      Generalized anxiety disorder    Managed with zoloft,  No longer taking xanax.  zoloft  Refill given.        Relevant Medications   sertraline (ZOLOFT) 50 MG tablet   Encounter for preventive health examination - Primary    Annual comprehensive preventive exam was done as well as an evaluation and management of chronic conditions .  During the course of the visit the patient was educated and counseled about appropriate screening and preventive services including :  diabetes screening, lipid analysis with projected  10 year  risk for CAD , nutrition counseling, breast, cervical and colorectal cancer screening, and recommended immunizations.  Printed recommendations for health maintenance screenings was given       Other Visit Diagnoses    Palpitations       Relevant Orders   TSH  Comprehensive metabolic panel   Lipid panel   Magnesium   CBC with Differential/Platelet   EKG 12-Lead (Completed)   Breast cancer screening       Relevant Orders   MM 3D SCREEN BREAST BILATERAL      I have discontinued Asencion Noble. Lippard's levofloxacin and predniSONE. I have also changed her sertraline. Additionally, I am having her maintain her multivitamin and Vitamin B-12.  Meds ordered this encounter  Medications  . sertraline (ZOLOFT) 50 MG tablet    Sig: Take 1 tablet (50 mg total) by mouth daily.    Dispense:  90 tablet    Refill:  1    FOR THIS FILL. 90DS PLEASE. THANKS.     Medications Discontinued During This Encounter  Medication Reason  . levofloxacin (LEVAQUIN) 500 MG tablet Completed Course  . predniSONE (DELTASONE) 10 MG tablet Completed Course  . sertraline (ZOLOFT) 50 MG tablet Reorder    Follow-up: No follow-ups on file.   Crecencio Mc, MD

## 2017-12-15 DIAGNOSIS — R002 Palpitations: Secondary | ICD-10-CM | POA: Insufficient documentation

## 2017-12-15 NOTE — Assessment & Plan Note (Signed)
Repeat PAP due in 2020

## 2017-12-15 NOTE — Assessment & Plan Note (Addendum)
Occurring both at rest and during exercise. I have reviewed an EKG done today and other than sinus bradycardia, it appears normal.    Screening labs ordered ([employer] employee) ; if normal, will refer to cardiology for Holter monitor.  baslien bp is too low to start beta blocker.

## 2017-12-15 NOTE — Assessment & Plan Note (Signed)
Managed with zoloft,  No longer taking xanax.  zoloft  Refill given.

## 2017-12-15 NOTE — Assessment & Plan Note (Signed)
Annual comprehensive preventive exam was done as well as an evaluation and management of chronic conditions .  During the course of the visit the patient was educated and counseled about appropriate screening and preventive services including :  diabetes screening, lipid analysis with projected  10 year  risk for CAD , nutrition counseling, breast, cervical and colorectal cancer screening, and recommended immunizations.  Printed recommendations for health maintenance screenings was given 

## 2017-12-17 ENCOUNTER — Telehealth: Payer: Self-pay

## 2017-12-17 DIAGNOSIS — Z0000 Encounter for general adult medical examination without abnormal findings: Secondary | ICD-10-CM

## 2017-12-17 NOTE — Telephone Encounter (Signed)
Lab ordered for pt to pick up later this evening.

## 2017-12-20 ENCOUNTER — Other Ambulatory Visit: Payer: Self-pay | Admitting: Internal Medicine

## 2017-12-20 DIAGNOSIS — R740 Nonspecific elevation of levels of transaminase and lactic acid dehydrogenase [LDH]: Principal | ICD-10-CM

## 2017-12-20 DIAGNOSIS — R7401 Elevation of levels of liver transaminase levels: Secondary | ICD-10-CM

## 2017-12-20 LAB — COMPREHENSIVE METABOLIC PANEL
A/G RATIO: 1.9 (ref 1.2–2.2)
ALBUMIN: 4.3 g/dL (ref 3.5–5.5)
ALT: 56 IU/L — ABNORMAL HIGH (ref 0–32)
AST: 36 IU/L (ref 0–40)
Alkaline Phosphatase: 50 IU/L (ref 39–117)
BUN / CREAT RATIO: 14 (ref 9–23)
BUN: 12 mg/dL (ref 6–24)
Bilirubin Total: 0.4 mg/dL (ref 0.0–1.2)
CO2: 24 mmol/L (ref 20–29)
Calcium: 9.3 mg/dL (ref 8.7–10.2)
Chloride: 104 mmol/L (ref 96–106)
Creatinine, Ser: 0.85 mg/dL (ref 0.57–1.00)
GFR calc Af Amer: 90 mL/min/{1.73_m2} (ref >59)
GFR, EST NON AFRICAN AMERICAN: 78 mL/min/{1.73_m2} (ref >59)
GLOBULIN, TOTAL: 2.3 g/dL (ref 1.5–4.5)
Glucose: 86 mg/dL (ref 65–99)
POTASSIUM: 4.8 mmol/L (ref 3.5–5.2)
SODIUM: 141 mmol/L (ref 134–144)
Total Protein: 6.6 g/dL (ref 6.0–8.5)

## 2017-12-20 LAB — CBC WITH DIFFERENTIAL/PLATELET
BASOS: 1 %
Basophils Absolute: 0 10*3/uL (ref 0.0–0.2)
EOS (ABSOLUTE): 0.2 10*3/uL (ref 0.0–0.4)
EOS: 6 %
HEMATOCRIT: 40.6 % (ref 34.0–46.6)
HEMOGLOBIN: 13.7 g/dL (ref 11.1–15.9)
IMMATURE GRANS (ABS): 0 10*3/uL (ref 0.0–0.1)
IMMATURE GRANULOCYTES: 0 %
Lymphocytes Absolute: 1.6 10*3/uL (ref 0.7–3.1)
Lymphs: 42 %
MCH: 30.4 pg (ref 26.6–33.0)
MCHC: 33.7 g/dL (ref 31.5–35.7)
MCV: 90 fL (ref 79–97)
MONOCYTES: 10 %
MONOS ABS: 0.4 10*3/uL (ref 0.1–0.9)
NEUTROS PCT: 41 %
Neutrophils Absolute: 1.5 10*3/uL (ref 1.4–7.0)
Platelets: 239 10*3/uL (ref 150–450)
RBC: 4.5 x10E6/uL (ref 3.77–5.28)
RDW: 13.7 % (ref 12.3–15.4)
WBC: 3.7 10*3/uL (ref 3.4–10.8)

## 2017-12-20 LAB — LIPID PANEL
CHOL/HDL RATIO: 2.4 ratio (ref 0.0–4.4)
Cholesterol, Total: 183 mg/dL (ref 100–199)
HDL: 75 mg/dL (ref >39)
LDL CALC: 100 mg/dL — AB (ref 0–99)
Triglycerides: 40 mg/dL (ref 0–149)
VLDL Cholesterol Cal: 8 mg/dL (ref 5–40)

## 2017-12-20 LAB — TSH: TSH: 1.77 u[IU]/mL (ref 0.450–4.500)

## 2017-12-20 LAB — RUBEOLA ANTIBODY IGG: RUBEOLA AB, IGG: 280 [AU]/ml (ref >29.9)

## 2017-12-20 LAB — MAGNESIUM: MAGNESIUM: 2.2 mg/dL (ref 1.6–2.3)

## 2017-12-27 ENCOUNTER — Encounter: Payer: Self-pay | Admitting: Internal Medicine

## 2017-12-27 DIAGNOSIS — R740 Nonspecific elevation of levels of transaminase and lactic acid dehydrogenase [LDH]: Principal | ICD-10-CM

## 2017-12-27 DIAGNOSIS — R7401 Elevation of levels of liver transaminase levels: Secondary | ICD-10-CM

## 2018-01-05 LAB — ALDOLASE: Aldolase: 4 U/L (ref 3.3–10.3)

## 2018-01-05 LAB — CK: CK TOTAL: 70 U/L (ref 24–173)

## 2018-01-05 IMAGING — US US ABDOMEN COMPLETE
1 series · 14 of 25 positions shown · non-contrast
Comparison: CT 02/01/2016.

CLINICAL DATA: Elevated liver function test.

EXAM:
ABDOMEN ULTRASOUND COMPLETE

[Series 1: us abdomen complete · 0.13mm/px · 14 of 76 slices shown]
[im 1/76]
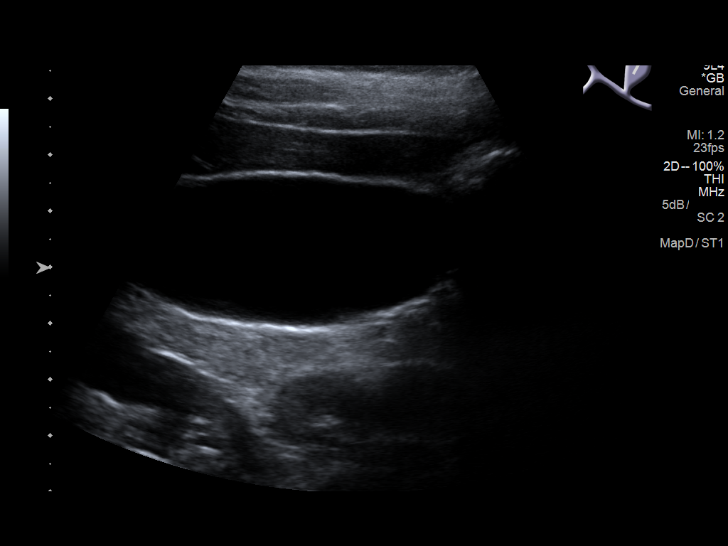
[im 7/76]
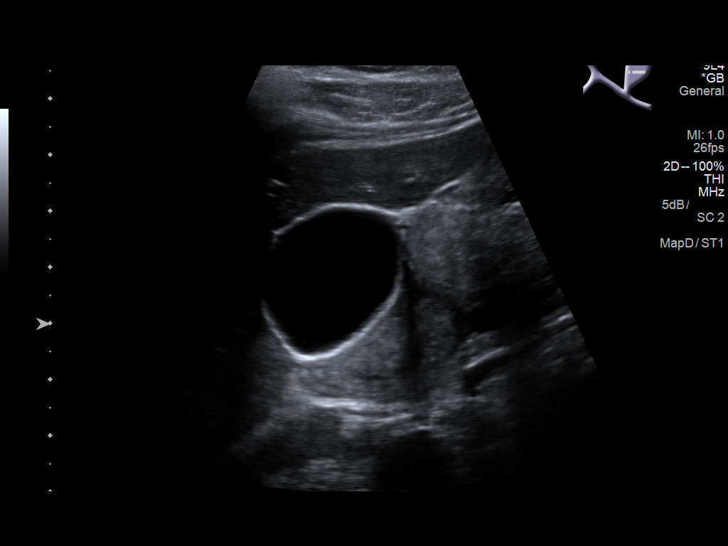
[im 13/76]
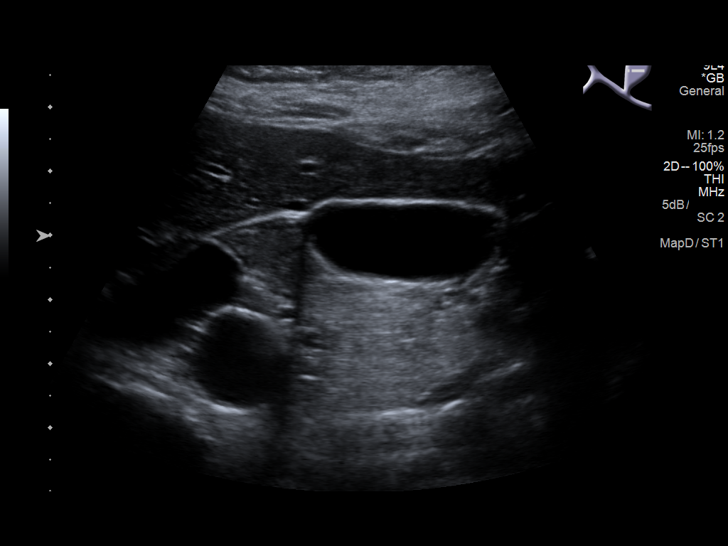
[im 19/76]
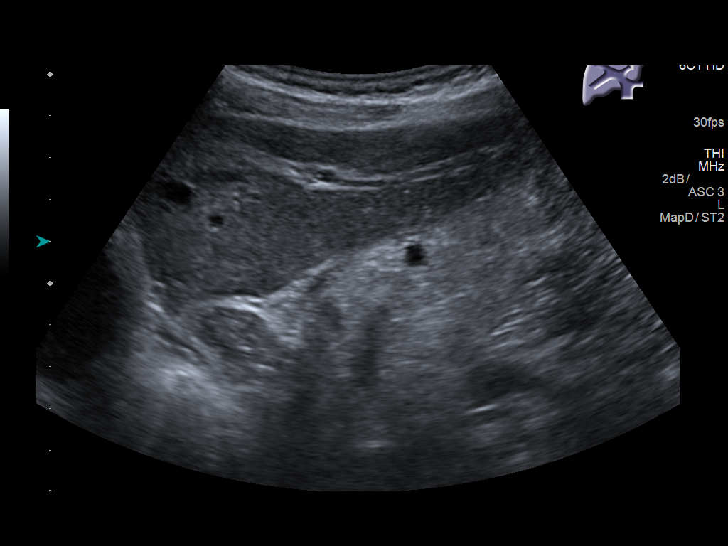
[im 26/76]
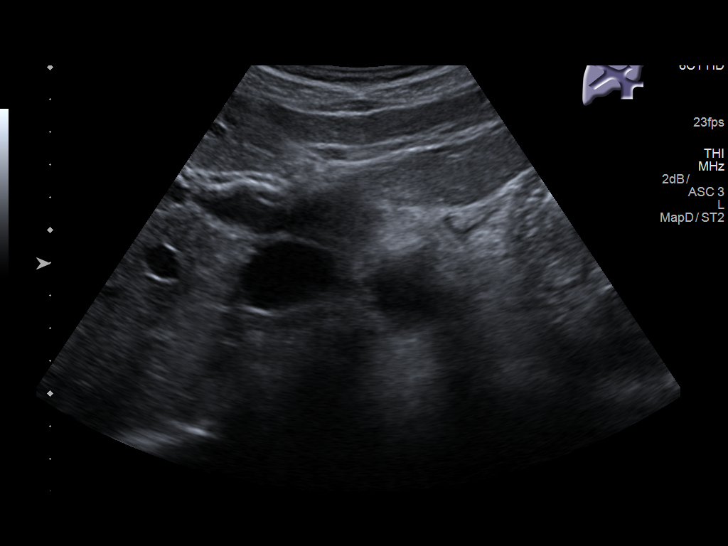
[im 29/76]
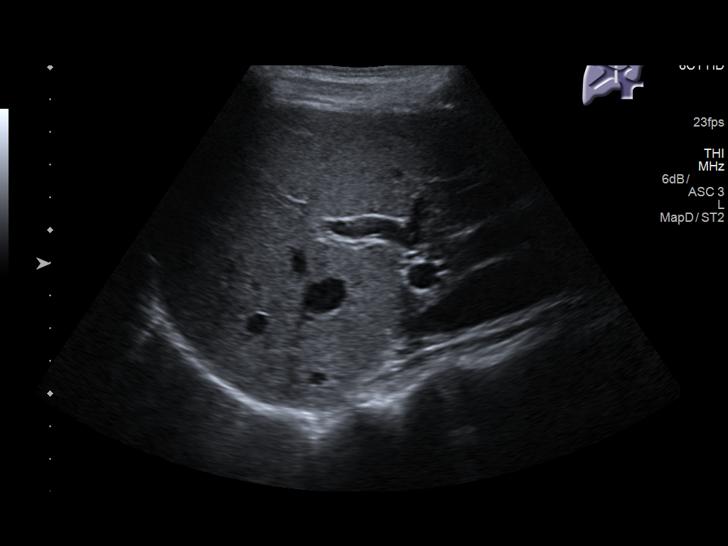
[im 35/76]
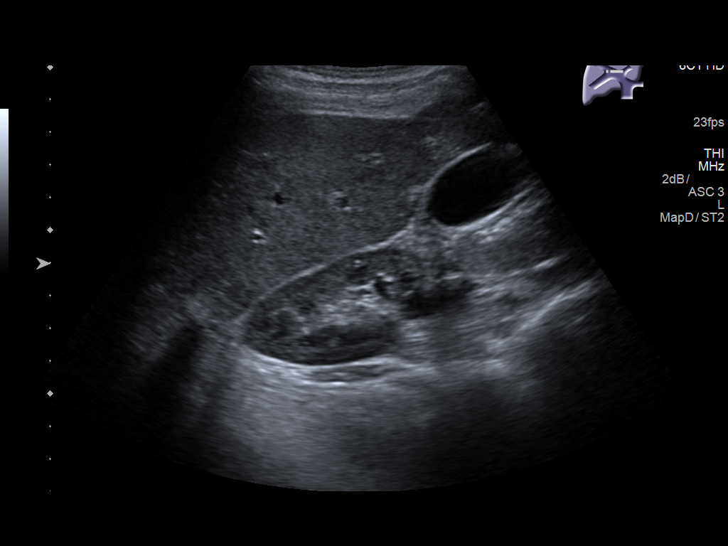
[im 41/76]
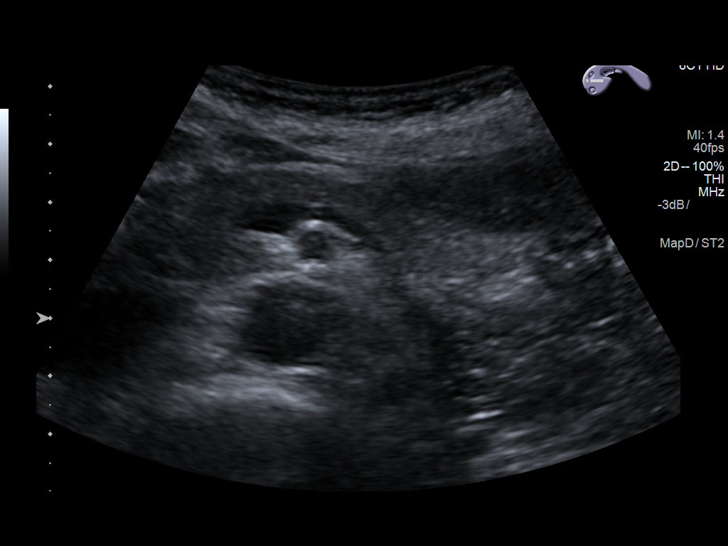
[im 47/76]
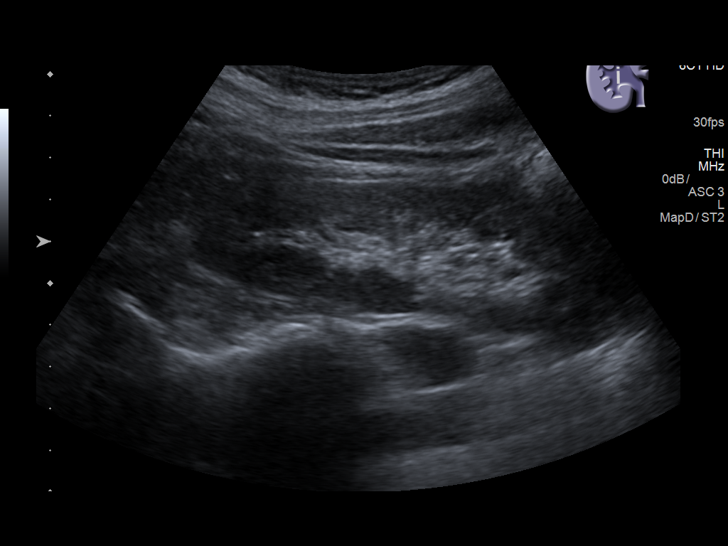
[im 51/76]
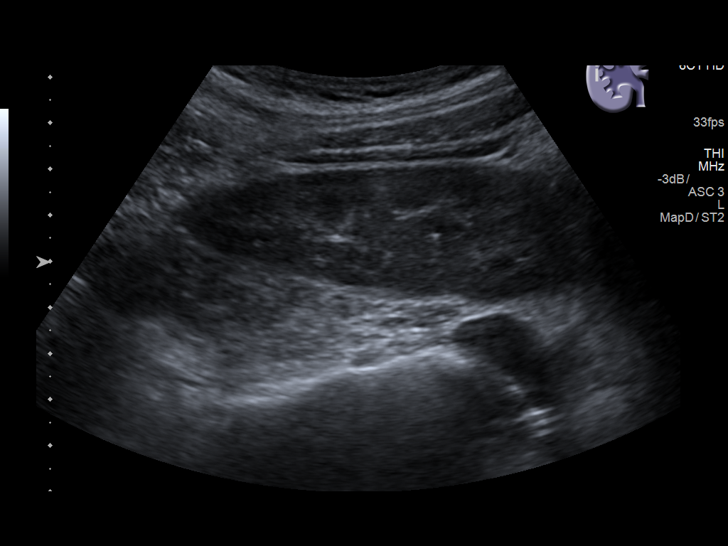
[im 57/76]
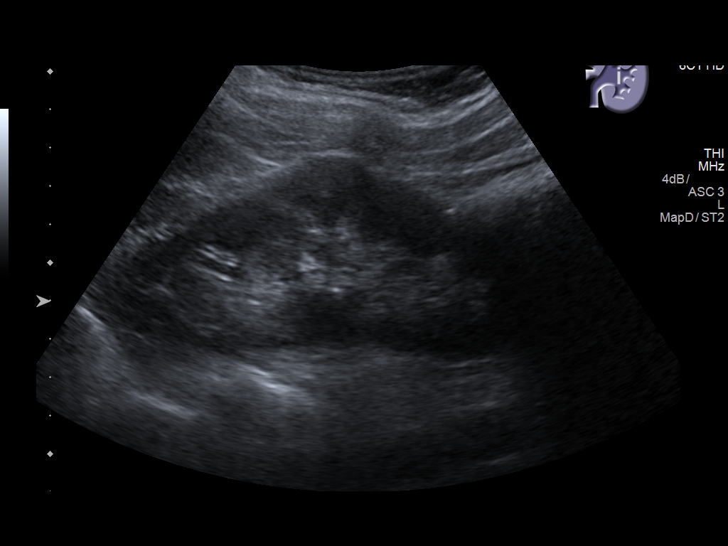
[im 63/76]
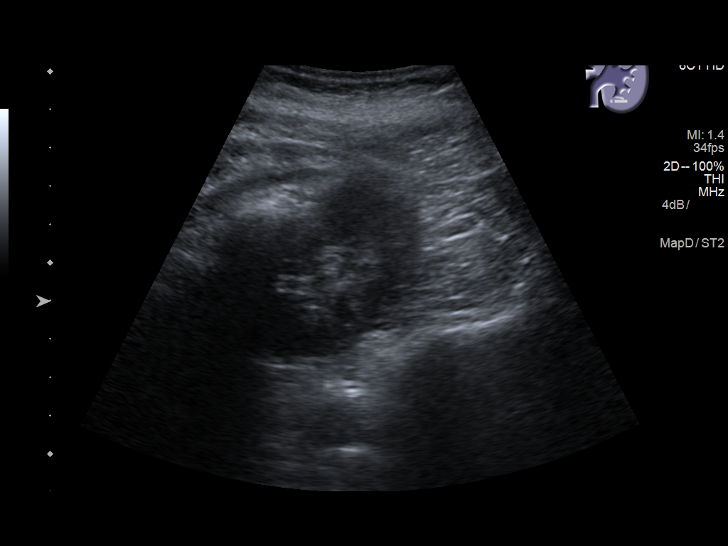
[im 69/76]
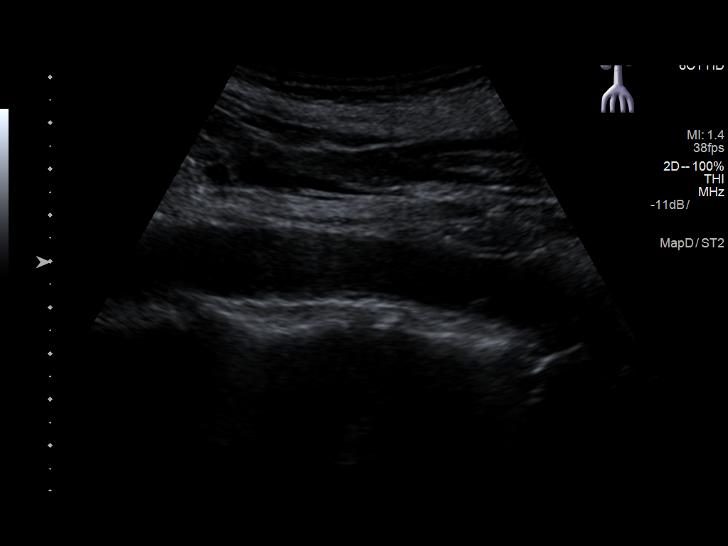
[im 76/76]
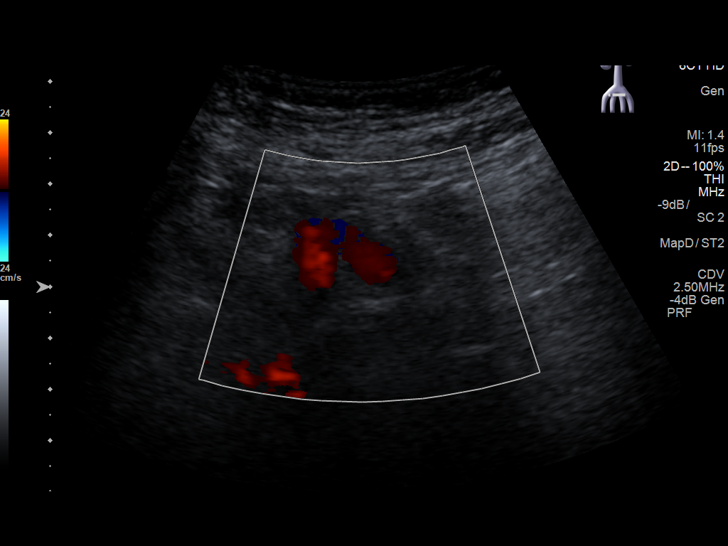

[14 of 25 positions shown; findings below may reference images not displayed]

FINDINGS: Gallbladder: No gallstones or wall thickening visualized. No
sonographic Murphy sign noted by sonographer.

Common bile duct: Diameter: 2 mm

Liver: No focal lesion identified. Within normal limits in
parenchymal echogenicity.

IVC: No abnormality visualized.

Pancreas: Visualized portion unremarkable.

Spleen: Size and appearance within normal limits.

Right Kidney: Length: 10.3 cm. Echogenicity within normal limits. No
mass or hydronephrosis visualized.

Left Kidney: Length: 10.9 cm. Echogenicity within normal limits. No
mass or hydronephrosis visualized.

Abdominal aorta: No aneurysm visualized.

Other findings: None.
IMPRESSION: No acute or focal abnormality.

## 2018-01-06 ENCOUNTER — Other Ambulatory Visit: Payer: Self-pay | Admitting: Internal Medicine

## 2018-01-06 DIAGNOSIS — R740 Nonspecific elevation of levels of transaminase and lactic acid dehydrogenase [LDH]: Principal | ICD-10-CM

## 2018-01-06 DIAGNOSIS — R7401 Elevation of levels of liver transaminase levels: Secondary | ICD-10-CM

## 2018-02-05 ENCOUNTER — Other Ambulatory Visit: Payer: Self-pay | Admitting: Student

## 2018-02-05 DIAGNOSIS — R1011 Right upper quadrant pain: Secondary | ICD-10-CM

## 2018-02-05 DIAGNOSIS — R748 Abnormal levels of other serum enzymes: Secondary | ICD-10-CM

## 2018-02-27 ENCOUNTER — Ambulatory Visit
Admission: RE | Admit: 2018-02-27 | Discharge: 2018-02-27 | Disposition: A | Discharge status: Home / Self Care | Payer: Managed Care, Other (non HMO) | Source: Ambulatory Visit | Attending: Student | Admitting: Student

## 2018-02-27 DIAGNOSIS — R1011 Right upper quadrant pain: Secondary | ICD-10-CM | POA: Diagnosis present

## 2018-02-27 DIAGNOSIS — R748 Abnormal levels of other serum enzymes: Secondary | ICD-10-CM

## 2018-02-27 MED ORDER — TECHNETIUM TC 99M MEBROFENIN IV KIT
5.0000 | PACK | Freq: Once | INTRAVENOUS | Status: AC | PRN
  Administered 2018-02-27: 5.46 via INTRAVENOUS

## 2018-04-01 ENCOUNTER — Ambulatory Visit
Admission: RE | Admit: 2018-04-01 | Discharge: 2018-04-01 | Disposition: A | Discharge status: Home / Self Care | Payer: Managed Care, Other (non HMO) | Source: Ambulatory Visit | Attending: Internal Medicine | Admitting: Internal Medicine

## 2018-04-01 DIAGNOSIS — Z1239 Encounter for other screening for malignant neoplasm of breast: Secondary | ICD-10-CM | POA: Insufficient documentation

## 2018-04-14 ENCOUNTER — Ambulatory Visit: Payer: Self-pay | Admitting: *Deleted

## 2018-04-14 ENCOUNTER — Encounter: Payer: Self-pay | Admitting: Internal Medicine

## 2018-04-14 ENCOUNTER — Ambulatory Visit: Payer: Managed Care, Other (non HMO) | Admitting: Internal Medicine

## 2018-04-14 DIAGNOSIS — L03211 Cellulitis of face: Secondary | ICD-10-CM | POA: Diagnosis not present

## 2018-04-14 MED ORDER — CEPHALEXIN 500 MG PO CAPS: 500.0000 mg | ORAL_CAPSULE | Freq: Four times a day (QID) | ORAL | 0 refills | Status: DC

## 2018-04-14 MED ORDER — ACYCLOVIR 400 MG PO TABS: 400.0000 mg | ORAL_TABLET | Freq: Every day | ORAL | 0 refills | Status: DC

## 2018-04-14 NOTE — Telephone Encounter (Signed)
FYI

## 2018-04-14 NOTE — Telephone Encounter (Signed)
She called in c/o having a itching, burning rash on her right cheek and her eyes are swollen since Yesterday.  See triage notes  I scheduled her with Dr. Derrel Nip for today at 11:00.    Reason for Disposition . [1] Pimples (localized) AND [place].Ok ] no improvement after using CARE ADVICE    Rash on face and eyes swollen.  Rash near eyes  Answer Assessment - Initial Assessment Questions 1. APPEARANCE of RASH: "Describe the rash."      Yesterday noticed little red bumps on forehead and on my right cheek.  It burns and itchy.   This morning I'm swollen around my eyes and more bumps.   I noticed some white pus stuff in them .   They itch and burn.   I've not changed anything.    I called my dermatologist can't see me until tomorrow. 2. LOCATION: "Where is the rash located?"      See above. 3. NUMBER: "How many spots are there?"      Numerous little red bumps. 4. SIZE: "How big are the spots?" (Inches, centimeters or compare to size of a coin)      2 cheek like a pencil eraser others are dots. 5. ONSET: "When did the rash start?"      Yesterday morning started out as a couple of little red bumps. 6. ITCHING: "Does the rash itch?" If so, ask: "How bad is the itch?"  (Scale 1-10; or mild, moderate, severe)     itches 7. PAIN: "Does the rash hurt?" If so, ask: "How bad is the pain?"  (Scale 1-10; or mild, moderate, severe)     burning 8. OTHER SYMPTOMS: "Do you have any other symptoms?" (e.g., fever)     White pus looking stuff out of 2 of them. 9. PREGNANCY: "Is there any chance you are pregnant?" "When was your last menstrual period?"     Not asked  Protocols used: RASH OR REDNESS - LOCALIZED-A-AH

## 2018-04-14 NOTE — Patient Instructions (Addendum)
I am treating you for a staph infection with cephalexin ,  Take it 4 times daily with food preferably STARTING NOW   Change out your makeup and moisturizers  YOU CAN USE BENADRYL CREAM OR ORAL BENADRYL .  AVOID HYDROCORTISONE  COOL COMPRESSES  TO MANAGE THE SWELLING    Daily use of a probiotic advised for 3 weeks to prevent c dif colitis . One serving of yogurt  with live cultures for a minimum of 3 weeks   START THE ACYCLOVIR IF THE RASH BECOMES CLEARLY ONLY ONE SIDED

## 2018-04-15 DIAGNOSIS — L03211 Cellulitis of face: Secondary | ICD-10-CM | POA: Insufficient documentation

## 2018-04-15 NOTE — Assessment & Plan Note (Signed)
Treating empirically for MRSA with Septra DS.  Bilateral involvement argues against zoster,  But acyclovir rx given in the event the rash becomes decidedly  One sided.  Probiotic  For 3 weeks advised.  Recommending she stop using current makeup and moisturizer as it may be contaminated.

## 2018-04-15 NOTE — Progress Notes (Signed)
Subjective:  Patient ID: Cynthia Houston, female    DOB: Sep 27, 1965  Age: 52 y.o. MRN: [id number]  CC: The encounter diagnosis was Diffuse cellulitis of face.  HPI Cynthia Houston presents for evaluation of facial rash.  Patient states that she woke up one day ago with several papules on the right side of her cheeck and forehead  that became raised, tender and pustular appearing. Over the following 24 hours  She developed more papules on her left  Cheek and her periorbital swelling without pustules on eyelids . She  Denies any  Recent yard work,  Estate manager/land agent,  Change in makeup,  Moisturizers, facial soaps.  No fevers,  Headaches or eye pain.  Some itching of affected areas.    Outpatient Medications Prior to Visit  Medication Sig Dispense Refill  . Cyanocobalamin (VITAMIN B-12) 5000 MCG SUBL Place 1 tablet under the tongue daily.    . Multiple Vitamin (MULTIVITAMIN) tablet Take 1 tablet by mouth daily.    . sertraline (ZOLOFT) 50 MG tablet Take 1 tablet (50 mg total) by mouth daily. 90 tablet 1   No facility-administered medications prior to visit.     Review of Systems;  Patient denies headache, fevers, malaise, unintentional weight loss, skin rash, eye pain, sinus congestion and sinus pain, sore throat, dysphagia,  hemoptysis , cough, dyspnea, wheezing, chest pain, palpitations, orthopnea, edema, abdominal pain, nausea, melena, diarrhea, constipation, flank pain, dysuria, hematuria, urinary  Frequency, nocturia, numbness, tingling, seizures,  Focal weakness, Loss of consciousness,  Tremor, insomnia, depression, anxiety, and suicidal ideation.      Objective:  BP 118/62 (BP Location: Left Arm, Patient Position: Sitting, Cuff Size: Normal)   Pulse 60   Temp 98.1 F (36.7 C) (Oral)   Resp 14   Ht 5' 3.5" (1.613 m)   Wt 122 lb 1.9 oz (55.4 kg)   SpO2 99%   BMI 21.29 kg/m   BP Readings from Last 3 Encounters:  04/14/18 118/62  12/13/17 100/66  09/25/17  110/66    Wt Readings from Last 3 Encounters:  04/14/18 122 lb 1.9 oz (55.4 kg)  12/13/17 120 lb 6.4 oz (54.6 kg)  09/25/17 120 lb (54.4 kg)    General appearance: alert, anxious, cooperative and appears stated age Ears: normal TM's and external ear canals both ears Eyes:  PERRL, EOMI Throat: lips, mucosa, and tongue normal; teeth and gums normal Neck: no adenopathy, no carotid bruit, supple, symmetrical, trachea midline and thyroid not enlarged, symmetric, no tenderness/mass/nodules Face: discrete pustular papules on right cheek, central forehead and left cheek.   Very Mild periorbital swelling  Back: symmetric, no curvature. ROM normal. No CVA tenderness. Lungs: clear to auscultation bilaterally Heart: regular rate and rhythm, S1, S2 normal, no murmur, click, rub or gallop Abdomen: soft, non-tender; bowel sounds normal; no masses,  no organomegaly Pulses: 2+ and symmetric Skin: Skin color, texture, turgor normal. No rashes or lesions Lymph nodes: Cervical, supraclavicular, and axillary nodes normal.  Lab Results  Component Value Date   HGBA1C 5.6 12/19/2016   HGBA1C 5.5 01/03/2016   HGBA1C 5.9 05/08/2014    Lab Results  Component Value Date   CREATININE 0.85 12/19/2017   CREATININE 0.75 12/19/2016   CREATININE 0.81 12/15/2015    Lab Results  Component Value Date   WBC 3.7 12/19/2017   HGB 13.7 12/19/2017   HCT 40.6 12/19/2017   PLT 239 12/19/2017   GLUCOSE 86 12/19/2017   CHOL 183 12/19/2017  TRIG 40 12/19/2017   HDL 75 12/19/2017   LDLCALC 100 (H) 12/19/2017   ALT 56 (H) 12/19/2017   AST 36 12/19/2017   NA 141 12/19/2017   K 4.8 12/19/2017   CL 104 12/19/2017   CREATININE 0.85 12/19/2017   BUN 12 12/19/2017   CO2 24 12/19/2017   TSH 1.770 12/19/2017   HGBA1C 5.6 12/19/2016    Mm 3d Screen Breast Bilateral  Result Date: 04/01/2018 CLINICAL DATA:  Screening. EXAM: DIGITAL SCREENING BILATERAL MAMMOGRAM WITH TOMO AND CAD COMPARISON:  Previous exam(s). ACR  Breast Density Category d: The breast tissue is extremely dense, which lowers the sensitivity of mammography. FINDINGS: There are no findings suspicious for malignancy. Images were processed with CAD. IMPRESSION: No mammographic evidence of malignancy. A result letter of this screening mammogram will be mailed directly to the patient. RECOMMENDATION: Screening mammogram in one year. (Code:SM-B-01Y) BI-RADS CATEGORY  1: Negative. Electronically Signed   By: Everlean Alstrom M.D.   On: 04/01/2018 14:14    Assessment & Plan:   Problem List Items Addressed This Visit    Diffuse cellulitis of face    Treating empirically for MRSA with Septra DS.  Bilateral involvement argues against zoster,  But acyclovir rx given in the event the rash becomes decidedly  One sided.  Probiotic  For 3 weeks advised.  Recommending she stop using current makeup and moisturizer as it may be contaminated.         I am having Cynthia Houston. Supak start on cephALEXin and acyclovir. I am also having her maintain her multivitamin, Vitamin B-12, and sertraline.  Meds ordered this encounter  Medications  . cephALEXin (KEFLEX) 500 MG capsule    Sig: Take 1 capsule (500 mg total) by mouth 4 (four) times daily.    Dispense:  28 capsule    Refill:  0  . acyclovir (ZOVIRAX) 400 MG tablet    Sig: Take 1 tablet (400 mg total) by mouth 5 (five) times daily.    Dispense:  35 tablet    Refill:  0    There are no discontinued medications.  Follow-up: No follow-ups on file.   Crecencio Mc, MD

## 2018-10-27 ENCOUNTER — Other Ambulatory Visit: Payer: Self-pay

## 2018-10-27 ENCOUNTER — Encounter: Payer: Self-pay | Admitting: Internal Medicine

## 2018-10-27 ENCOUNTER — Ambulatory Visit (INDEPENDENT_AMBULATORY_CARE_PROVIDER_SITE_OTHER): Payer: Managed Care, Other (non HMO) | Admitting: Internal Medicine

## 2018-10-27 DIAGNOSIS — B349 Viral infection, unspecified: Secondary | ICD-10-CM

## 2018-10-27 MED ORDER — PREDNISONE 10 MG PO TABS: ORAL_TABLET | ORAL | 0 refills | Status: DC

## 2018-10-27 MED ORDER — DOXYCYCLINE HYCLATE 100 MG PO TABS: 100.0000 mg | ORAL_TABLET | Freq: Two times a day (BID) | ORAL | 0 refills | Status: DC

## 2018-10-27 MED ORDER — DICLOFENAC SODIUM 75 MG PO TBEC: 75.0000 mg | DELAYED_RELEASE_TABLET | Freq: Two times a day (BID) | ORAL | 0 refills | Status: DC

## 2018-10-27 NOTE — Progress Notes (Signed)
Telephone  Note  This visit type was conducted due to national recommendations for restrictions regarding the COVID-19 pandemic (e.g. social distancing).  This format is felt to be most appropriate for this patient at this time.  All issues noted in this document were discussed and addressed.  No physical exam was performed (except for noted visual exam findings with Video Visits).   I initially connected with@ on 10/27/18 at  4:00 PM EDT by a video enabled telemedicine applicationand verified that I am speaking with the correct person using two identifiers. Location patient: home Location provider: work or home office Persons participating in the virtual visit: patient, provider  I discussed the limitations, risks, security and privacy concerns of performing an evaluation and management service by telephone and the availability of in person appointments. I also discussed with the patient that there may be a patient responsible charge related to this service. The patient expressed understanding and agreed to proceed.  Interactive audio and video telecommunications were attempted between this provider and patient, however failed, due to patient having technical difficulties   We continued and completed visit with audio only.   Reason for visit: sinus congestion   Allergies,  sneezing  Neck pain,  headache   HPI: Patient presents with acute symptoms of neck paib,  Low garde but persistent headache,  rhinitis and sneezing with clear rhinorrhea and without sinus pressure, ear or sinus  Pain, and body aches. Patient has been working from home but did not social distance on 2 occasions in the last 2 weeks when she had several friends over for dinner and drinks. She also has a dog  And found a tick crawling on her skin who was not engorged or attached.  Has not had a fever.     She has chronic intermittent Neck pain:  Has history of cervical spinal stenosis  By MRI done  Feb 2019 , but the current pain is  different and has been mild but persistent  since Friday , using tylenol and advil prn. Not sleeping well .  Has not worked out regularly since gyms closed 6 weeks ago   Denies cough, dyspnea,  Shortness of breath,  Loss of sense of taste or smell   ROS: See pertinent positives and negatives per HPI.  Past Medical History:  Diagnosis Date  . Chicken pox   . Depression   . Diffuse cystic mastopathy 2013  . Headache(784.0)   . Tendinitis    right elbow  . Urinary tract infection     Past Surgical History:  Procedure Laterality Date  . ABDOMINAL HYSTERECTOMY  2008   supracervical   . BREAST CYST ASPIRATION Right    neg  . BUNIONECTOMY Right 2011  . COLONOSCOPY  2008   Dr. Vira Agar  . DIAGNOSTIC LAPAROSCOPY    . ECTOPIC PREGNANCY SURGERY  1989  . gynecological surgery   2004  . TUBAL LIGATION    . UTERINE FIBROID SURGERY  2002   x2 2004    Family History  Problem Relation Age of Onset  . Hyperlipidemia Mother   . Hypertension Mother   . Arthritis Mother        rheumatoid arthritis  . Crohn's disease Father   . Heart disease Father 71       CABG  . Hyperlipidemia Father   . Hypertension Father   . Breast cancer Neg Hx     SOCIAL HX: married,  Works for [employer]. No tobacco   Current Outpatient Medications:  .  Cyanocobalamin (VITAMIN B-12) 5000 MCG SUBL, Place 1 tablet under the tongue daily., Disp: , Rfl:  .  Multiple Vitamin (MULTIVITAMIN) tablet, Take 1 tablet by mouth daily., Disp: , Rfl:  .  sertraline (ZOLOFT) 50 MG tablet, Take 1 tablet (50 mg total) by mouth daily. (Patient taking differently: Take 50 mg by mouth daily. ), Disp: 90 tablet, Rfl: 1 .  diclofenac (VOLTAREN) 75 MG EC tablet, Take 1 tablet (75 mg total) by mouth 2 (two) times daily., Disp: 30 tablet, Rfl: 0 .  doxycycline (VIBRA-TABS) 100 MG tablet, Take 1 tablet (100 mg total) by mouth 2 (two) times daily., Disp: 20 tablet, Rfl: 0 .  predniSONE (DELTASONE) 10 MG tablet, 6 tablets on Day 1 , then  reduce by 1 tablet daily until gone, Disp: 21 tablet, Rfl: 0  EXAM:  VITALS per patient if applicable:  GENERAL: alert, oriented, appears well and in no acute distress  HEENT: atraumatic, conjunttiva clear, no obvious abnormalities on inspection of external nose and ears  NECK: normal movements of the head and neck  LUNGS: on inspection no signs of respiratory distress, breathing rate appears normal, no obvious gross SOB, gasping or wheezing  CV: no obvious cyanosis  MS: moves all visible extremities without noticeable abnormality  PSYCH/NEURO: pleasant and cooperative, no obvious depression or anxiety, speech and thought processing grossly intact  ASSESSMENT AND PLAN:  Discussed the following assessment and plan:  Nonspecific syndrome suggestive of viral illness  Nonspecific syndrome suggestive of viral illness Advised that etiology may be a mild case of COVID 19, some other virus,  Or the sequelae of chronic cervical disk disease .  Advised to self quarantine for 7 days,  Consider testing at [facility] for [place] 19.  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 The patient was provided an opportunity to ask questions and all were answered. The patient agreed with the plan and demonstrated an understanding of the instructions.   The patient was advised to call back or seek an in-person evaluation if the symptoms worsen or if the condition fails to improve as anticipated.  I provided  25 minutes of non-face-to-face time during this encounter.   Crecencio Mc, MD

## 2018-10-27 NOTE — Patient Instructions (Addendum)
Your symptoms may be due to a mild case of COVID, or due to a variety of other viral illnesses.    I recommend that to be safe for everyone,  That you put yourself under quarantine.    A true "quarantine" is again, reserved for those patients with  Symptoms  It means :  YOU DO NOT LEAVE THE HOUSE UNLESS ABSOLUTELY NECESSARY AND ONLY IF YOU CAN WEAR  A MASK  AND GLOVES . YOU SHOULD TAKE YOUR MEALS ALONE. YOU SHOULD NOT  SHARE BATHROOMS  FOR HOW LONG?:   You should stay under quarantine until you can answer "yes" to all 3 of the following conditions:  Symptoms have been present  for a minimum of 7 days ,   You  Have been afebrile for at least 3 days   (and that means you cannot be taking tylenol, motrin or aleve)  Your other symptoms must be getting better   IF YOU REALLY WANT TO KNOW IF THIS IS COVID 19,  THE [facility] IS NOW TESTING ALL PATIENTS WHO PRESENT WITH SYMPTOMS  .  YOU DO NOT HAVE TO BE DEATHLY ILL TO GET TESTED!   THERE IS NO GOOD ALTERNATIVE FOR DOXYCYCLINE TO TREAT rocky [place] spotted fever. .  IF YOU DEVELOP FEVERS AND PERSISTENT HEADACHE AND FIND AN ENGORGED TICK ON YOUR BODY,  YOU WILL NEED TO TAKE DOXYCYCLINE AND PREDNISONE.    I WILL SEND BOTH TO YOUR PHARMACY SINCE I WILL BE OUT OF TOWN STARTING Thursday FOR ONE WEEK.

## 2018-10-28 DIAGNOSIS — B349 Viral infection, unspecified: Secondary | ICD-10-CM | POA: Insufficient documentation

## 2018-10-28 NOTE — Assessment & Plan Note (Signed)
Advised that etiology may be a mild case of COVID 19, some other virus,  Or the sequelae of chronic cervical disk disease .  Advised to self quarantine for 7 days,  Consider testing at [facility] for [place] 19.  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

## 2018-11-19 MED ORDER — SERTRALINE HCL 50 MG PO TABS: 50.0000 mg | ORAL_TABLET | Freq: Every day | ORAL | 1 refills | Status: DC

## 2018-12-16 ENCOUNTER — Other Ambulatory Visit: Payer: Self-pay

## 2018-12-16 ENCOUNTER — Ambulatory Visit (INDEPENDENT_AMBULATORY_CARE_PROVIDER_SITE_OTHER): Payer: Managed Care, Other (non HMO) | Admitting: Internal Medicine

## 2018-12-16 ENCOUNTER — Ambulatory Visit (INDEPENDENT_AMBULATORY_CARE_PROVIDER_SITE_OTHER): Payer: Managed Care, Other (non HMO)

## 2018-12-16 ENCOUNTER — Encounter: Payer: Self-pay | Admitting: Internal Medicine

## 2018-12-16 VITALS — BP 102/78 | HR 61 | Temp 98.5°F | Resp 14 | Ht 63.5 in | Wt 124.8 lb

## 2018-12-16 DIAGNOSIS — Z0001 Encounter for general adult medical examination with abnormal findings: Secondary | ICD-10-CM | POA: Diagnosis not present

## 2018-12-16 DIAGNOSIS — M25541 Pain in joints of right hand: Secondary | ICD-10-CM

## 2018-12-16 DIAGNOSIS — M85852 Other specified disorders of bone density and structure, left thigh: Secondary | ICD-10-CM

## 2018-12-16 DIAGNOSIS — Z1239 Encounter for other screening for malignant neoplasm of breast: Secondary | ICD-10-CM

## 2018-12-16 DIAGNOSIS — R748 Abnormal levels of other serum enzymes: Secondary | ICD-10-CM | POA: Diagnosis not present

## 2018-12-16 NOTE — Patient Instructions (Signed)
Your next PAP smear is due in 2021 Your next colonoscopy is also due in 2021  I have ordered your DEXA scan  Your annual mammogram has been ordered.  You are encouraged (required) to call to make your appointment at [facility]  Health Maintenance, Female Adopting a healthy lifestyle and getting preventive care can go a long way to promote health and wellness. Talk with your health care provider about what schedule of regular examinations is right for you. This is a good chance for you to check in with your provider about disease prevention and staying healthy. In between checkups, there are plenty of things you can do on your own. Experts have done a lot of research about which lifestyle changes and preventive measures are most likely to keep you healthy. Ask your health care provider for more information. Weight and diet Eat a healthy diet  Be sure to include plenty of vegetables, fruits, low-fat dairy products, and lean protein.  Do not eat a lot of foods high in solid fats, added sugars, or salt.  Get regular exercise. This is one of the most important things you can do for your health. ? Most adults should exercise for at least 150 minutes each week. The exercise should increase your heart rate and make you sweat (moderate-intensity exercise). ? Most adults should also do strengthening exercises at least twice a week. This is in addition to the moderate-intensity exercise. Maintain a healthy weight  Body mass index (BMI) is a measurement that can be used to identify possible weight problems. It estimates body fat based on height and weight. Your health care provider can help determine your BMI and help you achieve or maintain a healthy weight.  For females 64 years of age and older: ? A BMI below 18.5 is considered underweight. ? A BMI of 18.5 to 24.9 is normal. ? A BMI of 25 to 29.9 is considered overweight. ? A BMI of 30 and above is considered obese. Watch levels of  cholesterol and blood lipids  You should start having your blood tested for lipids and cholesterol at 55 years of age, then have this test every 5 years.  You may need to have your cholesterol levels checked more often if: ? Your lipid or cholesterol levels are high. ? You are older than 55 years of age. ? You are at high risk for heart disease. Cancer screening Lung Cancer  Lung cancer screening is recommended for adults 40-59 years old who are at high risk for lung cancer because of a history of smoking.  A yearly low-dose CT scan of the lungs is recommended for people who: ? Currently smoke. ? Have quit within the past 15 years. ? Have at least a 30-pack-year history of smoking. A pack year is smoking an average of one pack of cigarettes a day for 1 year.  Yearly screening should continue until it has been 15 years since you quit.  Yearly screening should stop if you develop a health problem that would prevent you from having lung cancer treatment. Breast Cancer  Practice breast self-awareness. This means understanding how your breasts normally appear and feel.  It also means doing regular breast self-exams. Let your health care provider know about any changes, no matter how small.  If you are in your 20s or 30s, you should have a clinical breast exam (CBE) by a health care provider every 1-3 years as part of a regular health exam.  If you are  69 or older, have a CBE every year. Also consider having a breast X-ray (mammogram) every year.  If you have a family history of breast cancer, talk to your health care provider about genetic screening.  If you are at high risk for breast cancer, talk to your health care provider about having an MRI and a mammogram every year.  Breast cancer gene (BRCA) assessment is recommended for women who have family members with BRCA-related cancers. BRCA-related cancers include: ? Breast. ? Ovarian. ? Tubal. ? Peritoneal cancers.  Results of  the assessment will determine the need for genetic counseling and BRCA1 and BRCA2 testing. Cervical Cancer Your health care provider may recommend that you be screened regularly for cancer of the pelvic organs (ovaries, uterus, and vagina). This screening involves a pelvic examination, including checking for microscopic changes to the surface of your cervix (Pap test). You may be encouraged to have this screening done every 3 years, beginning at age 102.  For women ages 41-65, health care providers may recommend pelvic exams and Pap testing every 3 years, or they may recommend the Pap and pelvic exam, combined with testing for human papilloma virus (HPV), every 5 years. Some types of HPV increase your risk of cervical cancer. Testing for HPV may also be done on women of any age with unclear Pap test results.  Other health care providers may not recommend any screening for nonpregnant women who are considered low risk for pelvic cancer and who do not have symptoms. Ask your health care provider if a screening pelvic exam is right for you.  If you have had past treatment for cervical cancer or a condition that could lead to cancer, you need Pap tests and screening for cancer for at least 20 years after your treatment. If Pap tests have been discontinued, your risk factors (such as having a new sexual partner) need to be reassessed to determine if screening should resume. Some women have medical problems that increase the chance of getting cervical cancer. In these cases, your health care provider may recommend more frequent screening and Pap tests. Colorectal Cancer  This type of cancer can be detected and often prevented.  Routine colorectal cancer screening usually begins at 55 years of age and continues through 55 years of age.  Your health care provider may recommend screening at an earlier age if you have risk factors for colon cancer.  Your health care provider may also recommend using home test  kits to check for hidden blood in the stool.  A small camera at the end of a tube can be used to examine your colon directly (sigmoidoscopy or colonoscopy). This is done to check for the earliest forms of colorectal cancer.  Routine screening usually begins at age 31.  Direct examination of the colon should be repeated every 5-10 years through 55 years of age. However, you may need to be screened more often if early forms of precancerous polyps or small growths are found. Skin Cancer  Check your skin from head to toe regularly.  Tell your health care provider about any new moles or changes in moles, especially if there is a change in a mole's shape or color.  Also tell your health care provider if you have a mole that is larger than the size of a pencil eraser.  Always use sunscreen. Apply sunscreen liberally and repeatedly throughout the day.  Protect yourself by wearing long sleeves, pants, a wide-brimmed hat, and sunglasses whenever you are outside. Heart  disease, diabetes, and high blood pressure  High blood pressure causes heart disease and increases the risk of stroke. High blood pressure is more likely to develop in: ? People who have blood pressure in the high end of the normal range (130-139/85-89 mm Hg). ? People who are overweight or obese. ? People who are African American.  If you are 13-52 years of age, have your blood pressure checked every 3-5 years. If you are 43 years of age or older, have your blood pressure checked every year. You should have your blood pressure measured twice-once when you are at a hospital or clinic, and once when you are not at a hospital or clinic. Record the average of the two measurements. To check your blood pressure when you are not at a hospital or clinic, you can use: ? An automated blood pressure machine at a pharmacy. ? A home blood pressure monitor.  If you are between 53 years and 85 years old, ask your health care provider if you should  take aspirin to prevent strokes.  Have regular diabetes screenings. This involves taking a blood sample to check your fasting blood sugar level. ? If you are at a normal weight and have a low risk for diabetes, have this test once every three years after 55 years of age. ? If you are overweight and have a high risk for diabetes, consider being tested at a younger age or more often. Preventing infection Hepatitis B  If you have a higher risk for hepatitis B, you should be screened for this virus. You are considered at high risk for hepatitis B if: ? You were born in a country where hepatitis B is common. Ask your health care provider which countries are considered high risk. ? Your parents were born in a high-risk country, and you have not been immunized against hepatitis B (hepatitis B vaccine). ? You have HIV or AIDS. ? You use needles to inject street drugs. ? You live with someone who has hepatitis B. ? You have had sex with someone who has hepatitis B. ? You get hemodialysis treatment. ? You take certain medicines for conditions, including cancer, organ transplantation, and autoimmune conditions. Hepatitis C  Blood testing is recommended for: ? Everyone born from [birth year] through 1967. ? Anyone with known risk factors for hepatitis C. Sexually transmitted infections (STIs)  You should be screened for sexually transmitted infections (STIs) including gonorrhea and chlamydia if: ? You are sexually active and are younger than 55 years of age. ? You are older than 55 years of age and your health care provider tells you that you are at risk for this type of infection. ? Your sexual activity has changed since you were last screened and you are at an increased risk for chlamydia or gonorrhea. Ask your health care provider if you are at risk.  If you do not have HIV, but are at risk, it may be recommended that you take a prescription medicine daily to prevent HIV infection. This is called  pre-exposure prophylaxis (PrEP). You are considered at risk if: ? You are sexually active and do not regularly use condoms or know the HIV status of your partner(s). ? You take drugs by injection. ? You are sexually active with a partner who has HIV. Talk with your health care provider about whether you are at high risk of being infected with HIV. If you choose to begin PrEP, you should first be tested for HIV. You should then be  tested every 3 months for as long as you are taking PrEP. Pregnancy  If you are premenopausal and you may become pregnant, ask your health care provider about preconception counseling.  If you may become pregnant, take 400 to 800 micrograms (mcg) of folic acid every day.  If you want to prevent pregnancy, talk to your health care provider about birth control (contraception). Osteoporosis and menopause  Osteoporosis is a disease in which the bones lose minerals and strength with aging. This can result in serious bone fractures. Your risk for osteoporosis can be identified using a bone density scan.  If you are 8 years of age or older, or if you are at risk for osteoporosis and fractures, ask your health care provider if you should be screened.  Ask your health care provider whether you should take a calcium or vitamin D supplement to lower your risk for osteoporosis.  Menopause may have certain physical symptoms and risks.  Hormone replacement therapy may reduce some of these symptoms and risks. Talk to your health care provider about whether hormone replacement therapy is right for you. Follow these instructions at home:  Schedule regular health, dental, and eye exams.  Stay current with your immunizations.  Do not use any tobacco products including cigarettes, chewing tobacco, or electronic cigarettes.  If you are pregnant, do not drink alcohol.  If you are breastfeeding, limit how much and how often you drink alcohol.  Limit alcohol intake to no more  than 1 drink per day for nonpregnant women. One drink equals 12 ounces of beer, 5 ounces of wine, or 1 ounces of hard liquor.  Do not use street drugs.  Do not share needles.  Ask your health care provider for help if you need support or information about quitting drugs.  Tell your health care provider if you often feel depressed.  Tell your health care provider if you have ever been abused or do not feel safe at home. This information is not intended to replace advice given to you by your health care provider. Make sure you discuss any questions you have with your health care provider. Document Released: 12/25/2010 Document Revised: 11/17/2015 Document Reviewed: 03/15/2015 Elsevier Interactive Patient Education  2019 [employer].

## 2018-12-16 NOTE — Assessment & Plan Note (Signed)
Checking hand films and ESR

## 2018-12-16 NOTE — Assessment & Plan Note (Signed)

## 2018-12-16 NOTE — Progress Notes (Signed)
Patient ID: Cynthia Houston, female    DOB: Aug 11, 1965  Age: 53 y.o. MRN: [id number]  The patient is here for annual preventive  examination and management of other chronic and acute problems.    The risk factors are reflected in the social history.  The roster of all physicians providing medical care to patient - is listed in the Snapshot section of the chart.  Activities of daily living:  The patient is 100% independent in all ADLs: dressing, toileting, feeding as well as independent mobility  Home safety : The patient has smoke detectors in the home. They wear seatbelts.  There are no firearms at home. There is no violence in the home.   There is no risks for hepatitis, STDs or HIV. There is no   history of blood transfusion. They have no travel history to infectious disease endemic areas of the world.  The patient has seen their dentist in the last six month. They have seen their eye doctor in the last year. She denies t hearing difficulty .    She does not  have excessive sun exposure. Discussed the need for sun protection: hats, long sleeves and use of sunscreen if there is significant sun exposure.   Diet: the importance of a healthy diet is discussed. They do have a healthy diet.  The benefits of regular aerobic exercise were discussed. She has been exercising infrequently since the pandemic resulted in gym closings,  And sh has gained 4 lbs .   Depression screen: there are no signs or vegative symptoms of depression- irritability, change in appetite, anhedonia, sadness/tearfullness.  The following portions of the patient's history were reviewed and updated as appropriate: allergies, current medications, past family history, past medical history,  past surgical history, past social history  and problem list.  Visual acuity was not assessed per patient preference since she has regular follow up with her ophthalmologist. Hearing and body mass index were assessed and reviewed.   During  the course of the visit the patient was educated and counseled about appropriate screening and preventive services including : fall prevention , diabetes screening, nutrition counseling, colorectal cancer screening, and recommended immunizations.    CC: The primary encounter diagnosis was Encounter for routine adult medical exam with abnormal findings. Diagnoses of Joint pain in fingers of right hand, Elevated liver enzymes, Osteopenia of neck of left femur, and Breast cancer screening were also pertinent to this visit.  Joint pain.  She reports pain and swelling of the joints on her right hand that is intermittent but present today .  No history of overuse.   History Aden has a past medical history of Chicken pox, Depression, Diffuse cystic mastopathy (2013), Headache(784.0), dysplastic nevus, Tendinitis, and Urinary tract infection.   She has a past surgical history that includes Ectopic pregnancy surgery (1989); Uterine fibroid surgery (2002); Abdominal hysterectomy (2008); gynecological surgery  (2004); Diagnostic laparoscopy; Tubal ligation; Bunionectomy (Right, 2011); Colonoscopy (2008); and Breast cyst aspiration (Right).   Her family history includes Arthritis in her mother; Crohn's disease in her father; Heart disease (age of onset: 32) in her father; Hyperlipidemia in her father and mother; Hypertension in her father and mother.She reports that she has never smoked. She has never used smokeless tobacco. She reports current alcohol use. She reports that she does not use drugs.  Outpatient Medications Prior to Visit  Medication Sig Dispense Refill  . Cyanocobalamin (VITAMIN B-12) 5000 MCG SUBL Place 1 tablet under the tongue daily.    Marland Kitchen  Multiple Vitamin (MULTIVITAMIN) tablet Take 1 tablet by mouth daily.    . sertraline (ZOLOFT) 50 MG tablet Take 1 tablet (50 mg total) by mouth daily. 90 tablet 1  . diclofenac (VOLTAREN) 75 MG EC tablet Take 1 tablet (75 mg total) by mouth 2 (two) times  daily. (Patient not taking: Reported on 12/16/2018) 30 tablet 0  . doxycycline (VIBRA-TABS) 100 MG tablet Take 1 tablet (100 mg total) by mouth 2 (two) times daily. (Patient not taking: Reported on 12/16/2018) 20 tablet 0  . predniSONE (DELTASONE) 10 MG tablet 6 tablets on Day 1 , then reduce by 1 tablet daily until gone (Patient not taking: Reported on 12/16/2018) 21 tablet 0   No facility-administered medications prior to visit.     Review of Systems   Patient denies headache, fevers, malaise, unintentional weight loss, skin rash, eye pain, sinus congestion and sinus pain, sore throat, dysphagia,  hemoptysis , cough, dyspnea, wheezing, chest pain, palpitations, orthopnea, edema, abdominal pain, nausea, melena, diarrhea, constipation, flank pain, dysuria, hematuria, urinary  Frequency, nocturia, numbness, tingling, seizures,  Focal weakness, Loss of consciousness,  Tremor, insomnia, depression, anxiety, and suicidal ideation.      Objective:  BP 102/78 (BP Location: Left Arm, Patient Position: Sitting, Cuff Size: Normal)   Pulse 61   Temp 98.5 F (36.9 C) (Oral)   Resp 14   Ht 5' 3.5" (1.613 m)   Wt 124 lb 12.8 oz (56.6 kg)   SpO2 99%   BMI 21.76 kg/m   Physical Exam   General appearance: alert, cooperative and appears stated age Head: Normocephalic, without obvious abnormality, atraumatic Eyes: conjunctivae/corneas clear. PERRL, EOM's intact. Fundi benign. Ears: normal TM's and external ear canals both ears Nose: Nares normal. Septum midline. Mucosa normal. No drainage or sinus tenderness. Throat: lips, mucosa, and tongue normal; teeth and gums normal Neck: no adenopathy, no carotid bruit, no JVD, supple, symmetrical, trachea midline and thyroid not enlarged, symmetric, no tenderness/mass/nodules Lungs: clear to auscultation bilaterally Breasts: normal appearance, no masses or tenderness Heart: regular rate and rhythm, S1, S2 normal, no murmur, click, rub or gallop Abdomen: soft,  non-tender; bowel sounds normal; no masses,  no organomegaly Extremities: extremities normal, atraumatic, no cyanosis or edema except for right hand interosseus muscles slightly swollen,  No synovitis.  Pulses: 2+ and symmetric Skin: Skin color, texture, turgor normal. No rashes or lesions Neurologic: Alert and oriented X 3, normal strength and tone. Normal symmetric reflexes. Normal coordination and gait.      Assessment & Plan:   Problem List Items Addressed This Visit    Encounter for routine adult medical exam with abnormal findings - Primary    age appropriate education and counseling updated, referrals for preventative services and immunizations addressed, dietary and smoking counseling addressed, most recent labs reviewed.  I have personally reviewed and have noted:  1) the patient's medical and social history 2) The pt's use of alcohol, tobacco, and illicit drugs 3) The patient's current medications and supplements 4) Functional ability including ADL's, fall risk, home safety risk, hearing and visual impairment 5) Diet and physical activities 6) Evidence for depression or mood disorder 7) The patient's height, weight, and BMI have been recorded in the chart  I have made referrals, and provided counseling and education based on review of the above      Joint pain in fingers of right hand    Checking hand films and ESR        Relevant Orders   DG  Hand Complete Right   Sedimentation rate    Other Visit Diagnoses    Elevated liver enzymes       Relevant Orders   TSH   Comprehensive metabolic panel   Osteopenia of neck of left femur       Relevant Orders   DG Bone Density   Breast cancer screening       Relevant Orders   MM 3D SCREEN BREAST BILATERAL      I have discontinued Asencion Noble. Olenik's diclofenac, doxycycline, and predniSONE. I am also having her maintain her multivitamin, Vitamin B-12, and sertraline.  No orders of the defined types were placed in this  encounter.   Medications Discontinued During This Encounter  Medication Reason  . diclofenac (VOLTAREN) 75 MG EC tablet Error  . doxycycline (VIBRA-TABS) 100 MG tablet Error  . predniSONE (DELTASONE) 10 MG tablet Error    Follow-up: No follow-ups on file.   Crecencio Mc, MD

## 2018-12-18 DIAGNOSIS — R7401 Elevation of levels of liver transaminase levels: Secondary | ICD-10-CM

## 2018-12-18 DIAGNOSIS — M25541 Pain in joints of right hand: Secondary | ICD-10-CM

## 2018-12-18 LAB — SEDIMENTATION RATE: Sed Rate: 2 mm/hr (ref 0–40)

## 2018-12-18 LAB — COMPREHENSIVE METABOLIC PANEL
ALT: 47 IU/L — ABNORMAL HIGH (ref 0–32)
AST: 27 IU/L (ref 0–40)
Albumin/Globulin Ratio: 2 (ref 1.2–2.2)
Albumin: 4.5 g/dL (ref 3.8–4.9)
Alkaline Phosphatase: 61 IU/L (ref 39–117)
BUN/Creatinine Ratio: 19 (ref 9–23)
BUN: 16 mg/dL (ref 6–24)
Bilirubin Total: 0.4 mg/dL (ref 0.0–1.2)
CO2: 24 mmol/L (ref 20–29)
Calcium: 9.5 mg/dL (ref 8.7–10.2)
Chloride: 102 mmol/L (ref 96–106)
Creatinine, Ser: 0.84 mg/dL (ref 0.57–1.00)
GFR calc Af Amer: 90 mL/min/{1.73_m2} (ref >59)
GFR calc non Af Amer: 78 mL/min/{1.73_m2} (ref >59)
Globulin, Total: 2.3 g/dL (ref 1.5–4.5)
Glucose: 91 mg/dL (ref 65–99)
Potassium: 4.5 mmol/L (ref 3.5–5.2)
Sodium: 140 mmol/L (ref 134–144)
Total Protein: 6.8 g/dL (ref 6.0–8.5)

## 2018-12-18 LAB — TSH: TSH: 1.55 u[IU]/mL (ref 0.450–4.500)

## 2018-12-30 ENCOUNTER — Encounter: Payer: Self-pay | Admitting: Internal Medicine

## 2019-02-04 ENCOUNTER — Encounter: Payer: Self-pay | Admitting: Family Medicine

## 2019-02-04 ENCOUNTER — Other Ambulatory Visit: Payer: Self-pay

## 2019-02-04 ENCOUNTER — Telehealth: Payer: Self-pay

## 2019-02-04 ENCOUNTER — Ambulatory Visit (INDEPENDENT_AMBULATORY_CARE_PROVIDER_SITE_OTHER): Payer: Managed Care, Other (non HMO) | Admitting: Family Medicine

## 2019-02-04 DIAGNOSIS — H9201 Otalgia, right ear: Secondary | ICD-10-CM | POA: Diagnosis not present

## 2019-02-04 MED ORDER — CEFDINIR 300 MG PO CAPS: 300.0000 mg | ORAL_CAPSULE | Freq: Two times a day (BID) | ORAL | 0 refills | Status: DC

## 2019-02-04 NOTE — Telephone Encounter (Signed)
Patient called to schedule appt.  Pt c/o headache, earache, pressure on right side of face and jaw pain.  Symptoms began w/ a headache on Monday.  Pt's PCP has no available appts today.  Pt scheduled a virtual visit at 3:15 pm today w/ Dr. Caryl Bis.

## 2019-02-04 NOTE — Progress Notes (Signed)
Virtual Visit via video Note  This visit type was conducted due to national recommendations for restrictions regarding the COVID-19 pandemic (e.g. social distancing).  This format is felt to be most appropriate for this patient at this time.  All issues noted in this document were discussed and addressed.  No physical exam was performed (except for noted visual exam findings with Video Visits).   I connected with Cynthia Houston today at  3:15 PM EDT by a video enabled telemedicine application and verified that I am speaking with the correct person using two identifiers. Location patient: home Location provider: work Persons participating in the virtual visit: patient, provider  I discussed the limitations, risks, security and privacy concerns of performing an evaluation and management service by telephone and the availability of in person appointments. I also discussed with the patient that there may be a patient responsible charge related to this service. The patient expressed understanding and agreed to proceed.  Reason for visit: Same day visit.  HPI: Right ear pain: Patient notes onset of right ear pain 2 to 3 days ago.  Notes it is a severe ache in her ear.  It has progressively gotten worse.  Feels quite a bit of pressure in the ear.  Some sinus pressure in her right sinuses.  No pain with opening her jaw.  No rhinorrhea.  No sneezing.  No significant congestion.  No postnasal drip.  No cough, fever, shortness of breath, or COVID-19 exposure.  Tylenol does take the edge off.  No tenderness of her temple.  No vision changes, numbness, or weakness.   ROS: See pertinent positives and negatives per HPI.  Past Medical History:  Diagnosis Date  . Chicken pox   . Depression   . Diffuse cystic mastopathy 2013  . Headache(784.0)   . Hx of dysplastic nevus    multiple sites  . Tendinitis    right elbow  . Urinary tract infection     Past Surgical History:  Procedure Laterality Date   . ABDOMINAL HYSTERECTOMY  2008   supracervical   . BREAST CYST ASPIRATION Right    neg  . BUNIONECTOMY Right 2011  . COLONOSCOPY  2008   Dr. Vira Agar  . DIAGNOSTIC LAPAROSCOPY    . ECTOPIC PREGNANCY SURGERY  1989  . gynecological surgery   2004  . TUBAL LIGATION    . UTERINE FIBROID SURGERY  2002   x2 2004    Family History  Problem Relation Age of Onset  . Hyperlipidemia Mother   . Hypertension Mother   . Arthritis Mother        rheumatoid arthritis  . Crohn's disease Father   . Heart disease Father 64       CABG  . Hyperlipidemia Father   . Hypertension Father   . Breast cancer Neg Hx     SOCIAL HX: Non-smoker.   Current Outpatient Medications:  .  Cyanocobalamin (VITAMIN B-12) 5000 MCG SUBL, Place 1 tablet under the tongue daily., Disp: , Rfl:  .  Multiple Vitamin (MULTIVITAMIN) tablet, Take 1 tablet by mouth daily., Disp: , Rfl:  .  sertraline (ZOLOFT) 50 MG tablet, Take 1 tablet (50 mg total) by mouth daily., Disp: 90 tablet, Rfl: 1 .  cefdinir (OMNICEF) 300 MG capsule, Take 1 capsule (300 mg total) by mouth 2 (two) times daily., Disp: 14 capsule, Rfl: 0  EXAM:  VITALS per patient if applicable: None.  GENERAL: alert, oriented, appears well and in no acute distress  HEENT:  atraumatic, conjunttiva clear, no obvious abnormalities on inspection of external nose and ears  NECK: normal movements of the head and neck  LUNGS: on inspection no signs of respiratory distress, breathing rate appears normal, no obvious gross SOB, gasping or wheezing  CV: no obvious cyanosis  MS: moves all visible extremities without noticeable abnormality   ASSESSMENT AND PLAN:  Discussed the following assessment and plan:  Right ear pain Concern for possible otitis media versus less likely sinusitis given symptoms.  She denies dental pain.  Given patient's allergies to Augmentin and doxycycline we will start her on cefdinir to cover for possible otitis media given her significant  ear pain.  She can trial ibuprofen over-the-counter for discomfort.  If not improving over the next 2 days she will contact us.    I discussed the assessment and treatment plan with the patient. The patient was provided an opportunity to ask questions and all were answered. The patient agreed with the plan and demonstrated an understanding of the instructions.   The patient was advised to call back or seek an in-person evaluation if the symptoms worsen or if the condition fails to improve as anticipated.  Tommi Rumps, MD

## 2019-02-04 NOTE — Telephone Encounter (Signed)
Seen for virtual visit.

## 2019-02-04 NOTE — Assessment & Plan Note (Signed)
Concern for possible otitis media versus less likely sinusitis given symptoms.  She denies dental pain.  Given patient's allergies to Augmentin and doxycycline we will start her on cefdinir to cover for possible otitis media given her significant ear pain.  She can trial ibuprofen over-the-counter for discomfort.  If not improving over the next 2 days she will contact us.

## 2019-02-16 ENCOUNTER — Ambulatory Visit
Admission: RE | Admit: 2019-02-16 | Discharge: 2019-02-16 | Disposition: A | Discharge status: Home / Self Care | Payer: Managed Care, Other (non HMO) | Source: Ambulatory Visit | Attending: Internal Medicine | Admitting: Internal Medicine

## 2019-02-16 DIAGNOSIS — M85852 Other specified disorders of bone density and structure, left thigh: Secondary | ICD-10-CM | POA: Diagnosis present

## 2019-04-03 ENCOUNTER — Ambulatory Visit
Admission: RE | Admit: 2019-04-03 | Discharge: 2019-04-03 | Disposition: A | Discharge status: Home / Self Care | Payer: Managed Care, Other (non HMO) | Source: Ambulatory Visit | Attending: Internal Medicine | Admitting: Internal Medicine

## 2019-04-03 DIAGNOSIS — Z1239 Encounter for other screening for malignant neoplasm of breast: Secondary | ICD-10-CM

## 2019-04-03 DIAGNOSIS — Z1231 Encounter for screening mammogram for malignant neoplasm of breast: Secondary | ICD-10-CM | POA: Diagnosis not present

## 2019-04-06 ENCOUNTER — Ambulatory Visit: Payer: Managed Care, Other (non HMO) | Admitting: Family Medicine

## 2019-04-06 ENCOUNTER — Other Ambulatory Visit: Payer: Self-pay

## 2019-04-06 ENCOUNTER — Encounter: Payer: Self-pay | Admitting: Family Medicine

## 2019-04-06 VITALS — BP 92/62 | HR 61 | Temp 96.4°F | Resp 16 | Ht 63.0 in | Wt 123.4 lb

## 2019-04-06 DIAGNOSIS — N760 Acute vaginitis: Secondary | ICD-10-CM | POA: Diagnosis not present

## 2019-04-06 DIAGNOSIS — R350 Frequency of micturition: Secondary | ICD-10-CM

## 2019-04-06 DIAGNOSIS — B9689 Other specified bacterial agents as the cause of diseases classified elsewhere: Secondary | ICD-10-CM

## 2019-04-06 DIAGNOSIS — N390 Urinary tract infection, site not specified: Secondary | ICD-10-CM

## 2019-04-06 LAB — POCT URINALYSIS DIPSTICK
Bilirubin, UA: NEGATIVE
Blood, UA: NEGATIVE
Glucose, UA: NEGATIVE
Ketones, UA: NEGATIVE
Nitrite, UA: NEGATIVE
Protein, UA: NEGATIVE
Spec Grav, UA: <=1.005 — AB (ref 1.010–1.025)
Urobilinogen, UA: 0.2 E.U./dL
pH, UA: 6 (ref 5.0–8.0)

## 2019-04-06 MED ORDER — METRONIDAZOLE 0.75 % VA GEL: 1.0000 | Freq: Every day | VAGINAL | 0 refills | Status: AC

## 2019-04-06 MED ORDER — NITROFURANTOIN MONOHYD MACRO 100 MG PO CAPS: 100.0000 mg | ORAL_CAPSULE | Freq: Two times a day (BID) | ORAL | 0 refills | Status: AC

## 2019-04-06 NOTE — Progress Notes (Signed)
Subjective:    Patient ID: Cynthia Houston, female    DOB: 1966/03/24, 53 y.o.   MRN: [id number]  HPI   Patient presents to clinic due to increased urinary frequency urgency and some burning with urination.  Also states she has had some tannish colored vaginal discharge; states it does smell somewhat.  Symptoms have been present for the past 2 or 3 days.  Denies any visible blood in urine.  Denies any abnormal vaginal bleeding.  No fever or chills, vomiting or diarrhea.  No abdominal pain.    Patient Active Problem List   Diagnosis Date Noted  . Right ear pain 02/04/2019  . Joint pain in fingers of right hand 12/16/2018  . Nonspecific syndrome suggestive of viral illness 10/28/2018  . Diffuse cellulitis of face 04/15/2018  . Intermittent palpitations 12/15/2017  . Generalized anxiety disorder 01/03/2016  . 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 Dyspareunia, female 05/07/2014  . Encounter for routine adult medical exam with abnormal findings 11/20/2012  . Fibrocystic breast disease 09/24/2012  . History of abnormal cervical Pap smear 05/07/2012  . Neutropenia ([place]) 05/07/2012   Social History   Tobacco Use  . Smoking status: Never Smoker  . Smokeless tobacco: Never Used  Substance Use Topics  . Alcohol use: Yes    Comment: occassionally   Review of Systems  Constitutional: Negative for chills, fatigue and fever.  HENT: Negative for congestion, ear pain, sinus pain and sore throat.   Eyes: Negative.   Respiratory: Negative for cough, shortness of breath and wheezing.   Cardiovascular: Negative for chest pain, palpitations and leg swelling.  Gastrointestinal: Negative for abdominal pain, diarrhea, nausea and vomiting.  Genitourinary: +dysuria, frequency and urgency.  Musculoskeletal: Negative for arthralgias and myalgias.  Skin: Negative for color change, pallor and rash.  Neurological: Negative for syncope, light-headedness and headaches.   Psychiatric/Behavioral: The patient is not nervous/anxious.       Objective:   Physical Exam Vitals signs and nursing note reviewed.  Constitutional:      General: She is not in acute distress.    Appearance: She is not ill-appearing, toxic-appearing or diaphoretic.  HENT:     Head: Normocephalic and atraumatic.  Eyes:     General: No scleral icterus.    Extraocular Movements: Extraocular movements intact.     Conjunctiva/sclera: Conjunctivae normal.  Cardiovascular:     Rate and Rhythm: Normal rate and regular rhythm.  Pulmonary:     Effort: Pulmonary effort is normal. No respiratory distress.     Breath sounds: Normal breath sounds.  Genitourinary:    Comments: Declines pelvic exam in clinic Skin:    General: Skin is warm and dry.     Coloration: Skin is not pale.  Neurological:     Mental Status: She is alert and oriented to person, place, and time.     Gait: Gait normal.  Psychiatric:        Mood and Affect: Mood normal.        Behavior: Behavior normal.    Today's Vitals   04/06/19 1530  BP: 92/62  Pulse: 61  Resp: 16  Temp: (!) 96.4 F (35.8 C)  TempSrc: Temporal  SpO2: 98%  Weight: 123 lb 6.4 oz (56 kg)  Height: 5\' 3"  (1.6 m)   Body mass index is 21.86 kg/m.     Assessment & Plan:    UTI-patient's urinalysis is positive for leukocytes.  We will treat UTI with antibiotics twice daily for 7  days.  Urine culture sent to lab for confirmation.  Advised to avoid excess sugar and caffeine beverages.  Advised to wear cotton underwear and always wipe front to back after using restroom.  Bacterial vaginitis-suspect discharge and foul smell is related to BV infection.  She will use Flagyl vaginal gel to treat.  Also advised to change out of any sweaty clothes as soon as possible; use mild unscented soap when bathing, no douching, and to even wear a nightgown with no underwear to bed to allow skin in GU area time to breathe.  Patient aware we will call her and let  her know if any medications need to be changed once her urine culture result is back.  Patient also will let us know if her symptoms do not resolve, advised patient if no resolution comes from current treatment plan then we will have to do a pelvic exam.

## 2019-04-08 LAB — URINE CULTURE
MICRO NUMBER:: 979281
Result:: NO GROWTH
SPECIMEN QUALITY:: ADEQUATE

## 2019-05-04 DIAGNOSIS — M159 Polyosteoarthritis, unspecified: Secondary | ICD-10-CM | POA: Insufficient documentation

## 2019-07-08 ENCOUNTER — Encounter: Payer: Self-pay | Admitting: Podiatry

## 2019-07-08 ENCOUNTER — Other Ambulatory Visit: Payer: Self-pay

## 2019-07-08 ENCOUNTER — Other Ambulatory Visit: Payer: Self-pay | Admitting: Podiatry

## 2019-07-14 ENCOUNTER — Other Ambulatory Visit: Payer: Self-pay

## 2019-07-14 ENCOUNTER — Other Ambulatory Visit
Admission: RE | Admit: 2019-07-14 | Discharge: 2019-07-14 | Disposition: A | Discharge status: Home / Self Care | Payer: Managed Care, Other (non HMO) | Source: Ambulatory Visit | Attending: Podiatry | Admitting: Podiatry

## 2019-07-14 DIAGNOSIS — Z01812 Encounter for preprocedural laboratory examination: Secondary | ICD-10-CM | POA: Diagnosis not present

## 2019-07-14 DIAGNOSIS — Z20822 Contact with and (suspected) exposure to covid-19: Secondary | ICD-10-CM | POA: Diagnosis not present

## 2019-07-15 LAB — SARS CORONAVIRUS 2 (TAT 6-24 HRS): SARS Coronavirus 2: NEGATIVE

## 2019-07-16 ENCOUNTER — Ambulatory Visit
Admission: RE | Admit: 2019-07-16 | Discharge: 2019-07-16 | Disposition: A | Discharge status: Home / Self Care | Payer: Managed Care, Other (non HMO) | Attending: Podiatry | Admitting: Podiatry

## 2019-07-16 ENCOUNTER — Ambulatory Visit: Payer: Managed Care, Other (non HMO) | Admitting: Anesthesiology

## 2019-07-16 ENCOUNTER — Encounter
Admission: RE | Disposition: A | Discharge status: Home / Self Care | Payer: Self-pay | Source: Home / Self Care | Attending: Podiatry

## 2019-07-16 ENCOUNTER — Other Ambulatory Visit: Payer: Self-pay

## 2019-07-16 ENCOUNTER — Encounter: Payer: Self-pay | Admitting: Podiatry

## 2019-07-16 DIAGNOSIS — G8929 Other chronic pain: Secondary | ICD-10-CM | POA: Diagnosis not present

## 2019-07-16 DIAGNOSIS — M2012 Hallux valgus (acquired), left foot: Secondary | ICD-10-CM | POA: Insufficient documentation

## 2019-07-16 DIAGNOSIS — F419 Anxiety disorder, unspecified: Secondary | ICD-10-CM | POA: Diagnosis not present

## 2019-07-16 DIAGNOSIS — M1990 Unspecified osteoarthritis, unspecified site: Secondary | ICD-10-CM | POA: Insufficient documentation

## 2019-07-16 DIAGNOSIS — M79672 Pain in left foot: Secondary | ICD-10-CM | POA: Diagnosis present

## 2019-07-16 DIAGNOSIS — F329 Major depressive disorder, single episode, unspecified: Secondary | ICD-10-CM | POA: Diagnosis not present

## 2019-07-16 HISTORY — DX: Other specified postprocedural states: Z98.890

## 2019-07-16 HISTORY — DX: Unspecified osteoarthritis, unspecified site: M19.90

## 2019-07-16 HISTORY — DX: Other specified postprocedural states: R11.2

## 2019-07-16 HISTORY — PX: BUNIONECTOMY: SHX129

## 2019-07-16 HISTORY — DX: Nausea with vomiting, unspecified: R11.2

## 2019-07-16 HISTORY — DX: Family history of other specified conditions: Z84.89

## 2019-07-16 HISTORY — PX: WEIL OSTEOTOMY: SHX5044

## 2019-07-16 HISTORY — PX: AIKEN OSTEOTOMY: SHX6331

## 2019-07-16 SURGERY — BUNIONECTOMY
Anesthesia: Regional | Site: Toe | Laterality: Left

## 2019-07-16 MED ORDER — ACETAMINOPHEN 160 MG/5ML PO SOLN: 325.0000 mg | ORAL | Status: DC | PRN

## 2019-07-16 MED ORDER — DEXAMETHASONE SODIUM PHOSPHATE 4 MG/ML IJ SOLN
INTRAMUSCULAR | Status: DC | PRN
  Administered 2019-07-16: 4 mg via INTRAVENOUS

## 2019-07-16 MED ORDER — POVIDONE-IODINE 7.5 % EX SOLN: Freq: Once | CUTANEOUS | Status: AC

## 2019-07-16 MED ORDER — ONDANSETRON HCL 4 MG/2ML IJ SOLN
INTRAMUSCULAR | Status: DC | PRN
  Administered 2019-07-16: 4 mg via INTRAVENOUS

## 2019-07-16 MED ORDER — GLYCOPYRROLATE 0.2 MG/ML IJ SOLN
INTRAMUSCULAR | Status: DC | PRN
  Administered 2019-07-16: .2 mg via INTRAVENOUS

## 2019-07-16 MED ORDER — OXYCODONE HCL 5 MG PO TABS: 5.0000 mg | ORAL_TABLET | Freq: Once | ORAL | Status: DC | PRN

## 2019-07-16 MED ORDER — SCOPOLAMINE 1 MG/3DAYS TD PT72
1.0000 | MEDICATED_PATCH | TRANSDERMAL | Status: DC
  Administered 2019-07-16: 1.5 mg via TRANSDERMAL

## 2019-07-16 MED ORDER — FENTANYL CITRATE (PF) 100 MCG/2ML IJ SOLN
INTRAMUSCULAR | Status: DC | PRN
  Administered 2019-07-16: 100 ug via INTRAVENOUS

## 2019-07-16 MED ORDER — CLINDAMYCIN PHOSPHATE 900 MG/50ML IV SOLN
900.0000 mg | INTRAVENOUS | Status: AC
  Administered 2019-07-16: 07:00:00 900 mg via INTRAVENOUS

## 2019-07-16 MED ORDER — LACTATED RINGERS IV SOLN: INTRAVENOUS | Status: DC

## 2019-07-16 MED ORDER — LIDOCAINE HCL (CARDIAC) PF 100 MG/5ML IV SOSY
PREFILLED_SYRINGE | INTRAVENOUS | Status: DC | PRN
  Administered 2019-07-16: 30 mg via INTRATRACHEAL

## 2019-07-16 MED ORDER — ROPIVACAINE HCL 5 MG/ML IJ SOLN
INTRAMUSCULAR | Status: DC | PRN
  Administered 2019-07-16: 40 mL via PERINEURAL

## 2019-07-16 MED ORDER — PROPOFOL 10 MG/ML IV BOLUS
INTRAVENOUS | Status: DC | PRN
  Administered 2019-07-16: 100 mg via INTRAVENOUS

## 2019-07-16 MED ORDER — OXYCODONE HCL 5 MG/5ML PO SOLN: 5.0000 mg | Freq: Once | ORAL | Status: DC | PRN

## 2019-07-16 MED ORDER — BUPIVACAINE HCL 0.25 % IJ SOLN
INTRAMUSCULAR | Status: DC | PRN
  Administered 2019-07-16: 10 mL

## 2019-07-16 MED ORDER — ONDANSETRON HCL 4 MG/2ML IJ SOLN
4.0000 mg | Freq: Once | INTRAMUSCULAR | Status: AC
  Administered 2019-07-16: 4 mg via INTRAVENOUS

## 2019-07-16 MED ORDER — PROMETHAZINE HCL 12.5 MG PO TABS: 12.5000 mg | ORAL_TABLET | Freq: Four times a day (QID) | ORAL | 0 refills | Status: DC | PRN

## 2019-07-16 MED ORDER — ACETAMINOPHEN 325 MG PO TABS: 325.0000 mg | ORAL_TABLET | ORAL | Status: DC | PRN

## 2019-07-16 MED ORDER — FENTANYL CITRATE (PF) 100 MCG/2ML IJ SOLN: 25.0000 ug | INTRAMUSCULAR | Status: DC | PRN

## 2019-07-16 MED ORDER — OXYCODONE-ACETAMINOPHEN 7.5-325 MG PO TABS: 1.0000 | ORAL_TABLET | ORAL | 0 refills | Status: AC | PRN

## 2019-07-16 MED ORDER — MIDAZOLAM HCL 2 MG/2ML IJ SOLN
INTRAMUSCULAR | Status: DC | PRN
  Administered 2019-07-16: 2 mg via INTRAVENOUS

## 2019-07-16 SURGICAL SUPPLY — 67 items
BENZOIN TINCTURE PRP APPL 2/3 (GAUZE/BANDAGES/DRESSINGS) ×3 IMPLANT
BIT DRILL 2 FENESTRATED (MISCELLANEOUS) IMPLANT
BIT DRILL CANNULTD 2.6 X 130MM (DRILL) IMPLANT
BIT DRILL SOLID 2.0 X 110MM (DRILL) IMPLANT
BIT DRILLL 2 FENESTRATED (MISCELLANEOUS) ×2
BLADE OSC/SAGITTAL MD 5.5X18 (BLADE) ×2 IMPLANT
BLADE OSCILLATING/SAGITTAL (BLADE) ×2
BLADE SW THK.38XMED LNG THN (BLADE) IMPLANT
BNDG ESMARK 4X12 TAN STRL LF (GAUZE/BANDAGES/DRESSINGS) ×3 IMPLANT
BNDG GAUZE 4.5X4.1 6PLY STRL (MISCELLANEOUS) ×3 IMPLANT
BNDG STRETCH 4X75 STRL LF (GAUZE/BANDAGES/DRESSINGS) ×3 IMPLANT
CANISTER SUCT 1200ML W/VALVE (MISCELLANEOUS) ×3 IMPLANT
CAST PADDING 3X4FT ST 30246 (SOFTGOODS) ×4
CLOSURE WOUND 1/4X4 (GAUZE/BANDAGES/DRESSINGS) ×1
CNTRSNK DRL 2 HDLS SCR (MISCELLANEOUS) IMPLANT
COUNTERSICK 4.0 HEADED (MISCELLANEOUS) ×3
COUNTERSINK 2.0 (MISCELLANEOUS) ×2
COVER LIGHT HANDLE UNIVERSAL (MISCELLANEOUS) ×6 IMPLANT
CUFF TOURN SGL QUICK 18X4 (TOURNIQUET CUFF) ×2 IMPLANT
DRAPE FLUOR MINI C-ARM 54X84 (DRAPES) ×3 IMPLANT
DRILL CANNULATED 2.6 X 130MM (DRILL) ×3
DRILL SOLID 2.0 X 110MM (DRILL) ×3
DURAPREP 26ML APPLICATOR (WOUND CARE) ×3 IMPLANT
ELECT REM PT RETURN 9FT ADLT (ELECTROSURGICAL) ×3
ELECTRODE REM PT RTRN 9FT ADLT (ELECTROSURGICAL) ×1 IMPLANT
GAUZE SPONGE 4X4 12PLY STRL (GAUZE/BANDAGES/DRESSINGS) ×3 IMPLANT
GAUZE XEROFORM 1X8 LF (GAUZE/BANDAGES/DRESSINGS) ×3 IMPLANT
GLOVE BIO SURGEON STRL SZ8 (GLOVE) ×5 IMPLANT
GOWN STRL REUS W/ TWL LRG LVL3 (GOWN DISPOSABLE) ×1 IMPLANT
GOWN STRL REUS W/ TWL XL LVL3 (GOWN DISPOSABLE) ×1 IMPLANT
GOWN STRL REUS W/TWL LRG LVL3 (GOWN DISPOSABLE) ×2
GOWN STRL REUS W/TWL XL LVL3 (GOWN DISPOSABLE) ×2
K-WIRE SMOOTH TROCAR 2.0X150 (WIRE) ×6
K-WIRE SNGL END 1.2X150 (MISCELLANEOUS) ×6
KIT PROCEDURE DRILL (DRILL) ×2 IMPLANT
KIT TURNOVER KIT A (KITS) ×3 IMPLANT
KWIRE SMOOTH TROCAR 2.0X150 (WIRE) IMPLANT
KWIRE SNGL END 1.2X150 (MISCELLANEOUS) IMPLANT
NDL HYPO 25GX1X1/2 BEV (NEEDLE) IMPLANT
NEEDLE HYPO 25GX1X1/2 BEV (NEEDLE) ×3 IMPLANT
NS IRRIG 500ML POUR BTL (IV SOLUTION) ×3 IMPLANT
PACK EXTREMITY ARMC (MISCELLANEOUS) ×3 IMPLANT
PAD CAST CTTN 3X4 STRL (SOFTGOODS) IMPLANT
PENCIL SMOKE EVACUATOR (MISCELLANEOUS) ×3 IMPLANT
PLATE STD LEFT LAPIDUS (Plate) IMPLANT
RASP SM TEAR CROSS CUT (RASP) ×2 IMPLANT
SCREW CANN SHT THR 4X34 (Screw) ×2 IMPLANT
SCREW COUNTERSINK 4.0 HEADED (MISCELLANEOUS) IMPLANT
SCREW HEADLESS SHRT THRD 2X12 (Screw) ×2 IMPLANT
SCREW LOCK 3 3.5X18 (Screw) ×2 IMPLANT
SCREW LOCK PLATE R3 2.7X12 (Screw) ×2 IMPLANT
SCREW LOCK PLATE R3 2.7X13 (Screw) ×2 IMPLANT
SCREW LOCK PLATE R3 2.7X15 (Screw) ×4 IMPLANT
SCREW NON LOCKING PLATE 2.7X14 (Screw) ×2 IMPLANT
SPLINT CAST 1 STEP 4X30 (MISCELLANEOUS) ×3 IMPLANT
SPLINT FAST PLASTER 5X30 (CAST SUPPLIES) ×2
SPLINT PLASTER CAST FAST 5X30 (CAST SUPPLIES) IMPLANT
STANDARD LAPIDUS PLATE (Plate) ×3 IMPLANT
STAPLE DYNACLIP 8X8 (Staple) ×2 IMPLANT
STOCKINETTE STRL 6IN 960660 (GAUZE/BANDAGES/DRESSINGS) ×3 IMPLANT
STRAP BODY AND KNEE 60X3 (MISCELLANEOUS) ×3 IMPLANT
STRIP CLOSURE SKIN 1/4X4 (GAUZE/BANDAGES/DRESSINGS) ×2 IMPLANT
SUT VIC AB 4-0 SH 27 (SUTURE) ×2
SUT VIC AB 4-0 SH 27XANBCTRL (SUTURE) IMPLANT
SUT VICRYL AB 3-0 FS1 BRD 27IN (SUTURE) ×2 IMPLANT
WIRE OLIVE SMOOTH 1.4MMX60MM (WIRE) ×4 IMPLANT
WIRE SMOOTH TROCAR .9MMX150MML (WIRE) ×4 IMPLANT

## 2019-07-16 NOTE — Progress Notes (Signed)
Assisted Veda Canning, ANMD with left, ultrasound guided, popliteal block. Side rails up, monitors on throughout procedure. See vital signs in flow sheet. Tolerated Procedure well.

## 2019-07-16 NOTE — Anesthesia Postprocedure Evaluation (Signed)
Anesthesia Post Note  Patient: Cynthia Houston  Procedure(s) Performed: LAPIDUS-TYPE (Left Toe) PHALANX OSTEOTOMY AKIN (Left Toe) WEIL OSTEOTOMY (Left Toe)     Patient location during evaluation: PACU Anesthesia Type: Regional and General Level of consciousness: awake Pain management: pain level controlled Vital Signs Assessment: post-procedure vital signs reviewed and stable Respiratory status: respiratory function stable Cardiovascular status: stable Postop Assessment: no signs of nausea or vomiting Anesthetic complications: no    Veda Canning

## 2019-07-16 NOTE — Transfer of Care (Signed)
Immediate Anesthesia Transfer of Care Note  Patient: Cynthia Houston  Procedure(s) Performed: LAPIDUS-TYPE (Left Toe) PHALANX OSTEOTOMY AKIN (Left Toe) WEIL OSTEOTOMY (Left Toe)  Patient Location: PACU  Anesthesia Type: General, Regional  Level of Consciousness: awake, alert  and patient cooperative  Airway and Oxygen Therapy: Patient Spontanous Breathing and Patient connected to supplemental oxygen  Post-op Assessment: Post-op Vital signs reviewed, Patient's Cardiovascular Status Stable, Respiratory Function Stable, Patent Airway and No signs of Nausea or vomiting  Post-op Vital Signs: Reviewed and stable  Complications: No apparent anesthesia complications

## 2019-07-16 NOTE — Op Note (Signed)
Operative note   Surgeon: Dr. Albertine Patricia, DPM.    Assistant: None    Preop diagnosis: 1.  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  Plantarflexed second metatarsal left foot    Postop diagnosis: Same    Procedure:   1.  Lapidus hallux valgus correction with Akin osteotomy of the proximal phalanx left foot   2.  Osteotomy second metatarsal left foo     EBL: Less than 5 cc    Anesthesia:general deliver by the anesthesia team with a popliteal block.  I delivered 10 cc of Marcaine across the saphenous distribution postoperatively to try to ensure good anesthetic response.   Hemostasis: Ankle tourniquet 108 minutes at 225 mmHg pressure    Specimen: None    Complications: None    Operative indications: Chronic pain unresponsive to conservative care    Procedure:  Patient was brought into the OR and placed on the operating table in thesupine position. After anesthesia was obtained theleft lower extremity was prepped and draped in usual sterile fashion.  Operative Report: This time attention directed to the first metatarsophalangeal joint where a 3 cm linear incision was made medial to the extensor tendon deepened sharp blunt dissection bleeders clamped and bovied as required.  Capital tissue identified incised longitudinally and flexed mediolaterally.  Head metatarsal was then identified and large medial dorsal medial eminence of bone was noted and resected with power equipment sagittal saw and a rasp.  Attention directed lateral aspect joint where a lateral capsulotomy and fibular sesamoidal ligament release were accomplished.  The adductor hallucis was not released.  After this was accomplished the area was copiously irrigated and attention was then directed to the first metatarsocuneiform joint where a 5 cm dorsal linear skin incision was made in continued with the distal incision.  This was deepened sharp blunt dissection please plan to believe it is quite vital  structure tracked medially and laterally.  Periosteal Tissue Was Incised Longitudinally Inflected Right Foot Dorsal and Medial Aspect of the First Met Cuneiform Joint.  The Joint Was Freddrick March up Utilizing an Osteotome and Sagittal Saw.  This Point a Spreader Was Used To Open up the Joint and the Articular Cartilage Was Resected off the Proximal Portion of the Base of the First Metatarsal and the Distal Portion of the Medial Cuneiform.  This Was Cut in a Fashion That Was Allowed for a Closing of the Intermetatarsal Angle.  Once This Was Accomplished the Bone Was Removed a Curette Was Used To Remove Any Residual Articular Cartilage and the Area Was Temporarily Fixated Check for Skin Good Position Correction Were Noted.  This Point the Temporary Fixation Was Removed and the Area Was Irrigated and Then the 2 Segments of the Fusion Area Were Fenestrated with a 2 Actor.  Once This Is Accomplished to the First Metatarsal Was Placed in a Corrected Position and Fixated Temporary K Wire.  There Was Checked FluoroScan Good Position Correction Were Noted.  Good Closure of the Intermetatarsal Angle Was Noted Excellent Position of the Sesamoids Was Noted.  This Point a 4 Oh Compression Screw Was Placed from Dorsal Distal and First Metatarsal to Proximal Plantar Medial into the Cuneiform This Was Seen to Close It down Nicely and Compress the Fusion Area.  There Is Check for Skin Good Position and Correction Were Noted with the Fixation As Well.  This Point a Lapidus Plate Was Placed across the First Met Cuneiform Joint from the [facility] Lapidus Fusion Set.  This  Was Checked and Seem to Be in Good Position with Good Fixation and No Abnormal Positions of the Screws or the plate.  There was an copiously irrigated and periosteal capsular tissue was closed along the extent of the incision with 4-0 Vicryl in a continuous stitch.  Deep superficial fascia layers were closed with 4-0 Vicryl also in a continuous stitch.  Skin was closed  with 4-0 Vicryl in a subcuticular fashion.  This time attention directed the second metatarsal phalangeal joint where a 3 cm linear incision was made just medial to the extensor tendon going to the second toe.  This was deepened with sharp blunt dissection bleeders clamped bovied as required.  The extensor hood was released medially on the tendon the tendon was retracted laterally.  A linear incision made through the periosteum and capsule tissue over the second metatarsal distally.  This was freed mediolaterally.  Head metatarsal was then identified and a sagittal saw was used to make an oblique cut going from dorsal distal plantar proximal intermetatarsal just at the osteochondral junction.  The metatarsal head was then shortened by 2 mm and moved more medial position temporarily fixated check for skin good position correction were noted.  At this point there was fixated with a 2 oh headless screw from the mini monster [employer] set.  This checked FluoroScan good fixation and correction were noted to all sites at this point.  This area was in capsule irrigated and periosteal Tissue was closed with 4-0 Vicryl in continuous stitch as were deep and superficial fascial layers.  Skin was closed with 4-0 Vicryl in a subcuticular stitch.  A sterile compressive dressing was then placed across the wound consisting of Steri-Strips Xeroform gauze 4 x 4's conformer and Kerlix.  Tourniquet was released and brought a complete vascularity was seen returned all digits of the left foot.    Patient tolerated the procedure and anesthesia well.  Was transported from the OR to the PACU with all vital signs stable and vascular status intact. To be discharged per routine protocol.  Will follow up in approximately 1 week in the outpatient clinic.

## 2019-07-16 NOTE — Anesthesia Procedure Notes (Signed)
Procedure Name: LMA Insertion Date/Time: 07/16/2019 7:37 AM Performed by: Georga Bora, CRNA Pre-anesthesia Checklist: Patient identified, Emergency Drugs available, Suction available, Timeout performed and Patient being monitored Patient Re-evaluated:Patient Re-evaluated prior to induction Oxygen Delivery Method: Circle system utilized Preoxygenation: Pre-oxygenation with 100% oxygen Induction Type: IV induction LMA: LMA inserted LMA Size: 4.0 Number of attempts: 1 Placement Confirmation: positive ETCO2 and breath sounds checked- equal and bilateral Tube secured with: Tape

## 2019-07-16 NOTE — Anesthesia Preprocedure Evaluation (Signed)
Anesthesia Evaluation  Patient identified by MRN, date of birth, ID band Patient awake    Reviewed: Allergy & Precautions, NPO status   History of Anesthesia Complications (+) PONV  Airway Mallampati: II  TM Distance: >3 FB     Dental   Pulmonary    breath sounds clear to auscultation       Cardiovascular negative cardio ROS   Rhythm:Regular Rate:Normal     Neuro/Psych  Headaches, Anxiety Depression    GI/Hepatic negative GI ROS,   Endo/Other    Renal/GU      Musculoskeletal  (+) Arthritis ,   Abdominal   Peds  Hematology   Anesthesia Other Findings   Reproductive/Obstetrics                             Anesthesia Physical Anesthesia Plan  ASA: II  Anesthesia Plan: General and Regional   Post-op Pain Management:    Induction: Intravenous  PONV Risk Score and Plan: 4 or greater and Ondansetron, Dexamethasone, Scopolamine patch - Pre-op and Midazolam  Airway Management Planned:   Additional Equipment:   Intra-op Plan:   Post-operative Plan:   Informed Consent: I have reviewed the patients History and Physical, chart, labs and discussed the procedure including the risks, benefits and alternatives for the proposed anesthesia with the patient or authorized representative who has indicated his/her understanding and acceptance.       Plan Discussed with: CRNA  Anesthesia Plan Comments:         Anesthesia Quick Evaluation

## 2019-07-16 NOTE — Discharge Instructions (Signed)
[place] DR. Aviyon Hocevar, DR. Vickki Muff, AND DR. Wyocena   1. Take your medication as prescribed.  Pain medication should be taken only as needed.  2. Keep the dressing clean, dry and intact.  Please remain completely nonweightbearing on the left foot.  Use the knee rest scooter and walker crutches or a wheelchair in order to accomplish this.  3. Keep your foot elevated above the heart level for the first 48 hours.  4. Do not take a shower. Baths are permissible as long as the foot is kept out of the water.   5. Every hour you are awake:  - Bend your knee 15 times. - Flex foot 15 times - Massage calf 15 times  6. Call [facility] [phone]) if any of the following problems occur: - You develop a temperature or fever. - The bandage becomes saturated with blood. - Medication does not stop your pain. - Injury of the foot occurs. - Any symptoms of infection including redness, odor, or red streaks running from wound.  General Anesthesia, Adult, Care After This sheet gives you information about how to care for yourself after your procedure. Your health care provider may also give you more specific instructions. If you have problems or questions, contact your health care provider. What can I expect after the procedure? After the procedure, the following side effects are common:  Pain or discomfort at the IV site.  Nausea.  Vomiting.  Sore throat.  Trouble concentrating.  Feeling cold or chills.  Weak or tired.  Sleepiness and fatigue.  Soreness and body aches. These side effects can affect parts of the body that were not involved in surgery. Follow these instructions at home:  For at least 24 hours after the procedure:  Have a responsible adult stay with you. It is important to have someone help care for you until you are awake and alert.  Rest as  needed.  Do not: ? Participate in activities in which you could fall or become injured. ? Drive. ? Use heavy machinery. ? Drink alcohol. ? Take sleeping pills or medicines that cause drowsiness. ? Make important decisions or sign legal documents. ? Take care of children on your own. Eating and drinking  Follow any instructions from your health care provider about eating or drinking restrictions.  When you feel hungry, start by eating small amounts of foods that are soft and easy to digest (bland), such as toast. Gradually return to your regular diet.  Drink enough fluid to keep your urine pale yellow.  If you vomit, rehydrate by drinking water, juice, or clear broth. General instructions  If you have sleep apnea, surgery and certain medicines can increase your risk for breathing problems. Follow instructions from your health care provider about wearing your sleep device: ? Anytime you are sleeping, including during daytime naps. ? While taking prescription pain medicines, sleeping medicines, or medicines that make you drowsy.  Return to your normal activities as told by your health care provider. Ask your health care provider what activities are safe for you.  Take over-the-counter and prescription medicines only as told by your health care provider.  If you smoke, do not smoke without supervision.  Keep all follow-up visits as told by your health care provider. This is important. Contact a health care provider if:  You have nausea or vomiting that does not get better with medicine.  You cannot eat or drink  without vomiting.  You have pain that does not get better with medicine.  You are unable to pass urine.  You develop a skin rash.  You have a fever.  You have redness around your IV site that gets worse. Get help right away if:  You have difficulty breathing.  You have chest pain.  You have blood in your urine or stool, or you vomit blood. Summary  After the  procedure, it is common to have a sore throat or nausea. It is also common to feel tired.  Have a responsible adult stay with you for the first 24 hours after general anesthesia. It is important to have someone help care for you until you are awake and alert.  When you feel hungry, start by eating small amounts of foods that are soft and easy to digest (bland), such as toast. Gradually return to your regular diet.  Drink enough fluid to keep your urine pale yellow.  Return to your normal activities as told by your health care provider. Ask your health care provider what activities are safe for you. This information is not intended to replace advice given to you by your health care provider. Make sure you discuss any questions you have with your health care provider. Document Revised: 06/14/2017 Document Reviewed: 01/25/2017 Elsevier Patient Education  [place].

## 2019-07-16 NOTE — Anesthesia Procedure Notes (Addendum)
Anesthesia Regional Block: Popliteal block   Pre-Anesthetic Checklist: ,, timeout performed, Correct Patient, Correct Site, Correct Laterality, Correct Procedure, Correct Position, site marked, Risks and benefits discussed,  Surgical consent,  Pre-op evaluation,  At surgeon's request and post-op pain management  Laterality: Left  Prep: chloraprep       Needles:   Needle Type: Stimiplex     Needle Length: 10cm  Needle Gauge: 21     Additional Needles:   Procedures:,,,, ultrasound used (permanent image in chart),,,,  Narrative:  Start time: 07/16/2019 6:58 AM End time: 07/16/2019 7:01 AM Injection made incrementally with aspirations every 5 mL. Anesthesiologist: Veda Canning, MD

## 2019-07-16 NOTE — H&P (Signed)
H and P has been reviewed and no changes are noted.  

## 2019-07-17 ENCOUNTER — Encounter: Payer: Self-pay | Admitting: *Deleted

## 2019-08-03 ENCOUNTER — Encounter: Payer: Self-pay | Admitting: Family Medicine

## 2019-08-03 ENCOUNTER — Ambulatory Visit (INDEPENDENT_AMBULATORY_CARE_PROVIDER_SITE_OTHER): Payer: Managed Care, Other (non HMO) | Admitting: Family Medicine

## 2019-08-03 ENCOUNTER — Other Ambulatory Visit: Payer: Self-pay

## 2019-08-03 ENCOUNTER — Telehealth: Payer: Self-pay | Admitting: Internal Medicine

## 2019-08-03 DIAGNOSIS — Z20822 Contact with and (suspected) exposure to covid-19: Secondary | ICD-10-CM | POA: Diagnosis not present

## 2019-08-03 NOTE — Telephone Encounter (Signed)
Pt called in has loss of taste or smell, headache, congestion. She said the symptoms on Friday. Dr. Derrel Nip has no appt available. Spoke with Morey Hummingbird at [facility] and she scheduled appt with Dr. Einar Pheasant.

## 2019-08-03 NOTE — Progress Notes (Signed)
I connected with Cynthia Houston on 08/03/19 at  9:20 AM EST by video and verified that I am speaking with the correct person using two identifiers.   I discussed the limitations, risks, security and privacy concerns of performing an evaluation and management service by video and the availability of in person appointments. I also discussed with the patient that there may be a patient responsible charge related to this service. The patient expressed understanding and agreed to proceed.  Patient location: Home Provider Location: [place] Participants: Cynthia Houston and Cynthia Houston   Subjective:     Cynthia Houston is a 54 y.o. female presenting for Covid sx (Congestion, loss of taste/smell, body aches, headache )     URI  This is a new problem. The current episode started in the past 7 days. Associated symptoms include congestion, coughing and headaches (left side and back of neck). Pertinent negatives include no chest pain, diarrhea, nausea, sore throat or vomiting. She has tried acetaminophen, NSAIDs and decongestant for the symptoms. The treatment provided mild relief.   Loss of taste/smell on Friday AM Working from home - recently had foot surgery - has only been to the doctor Lives with husband - no symptoms  Review of Systems  Constitutional: Positive for fatigue. Negative for chills and fever.  HENT: Positive for congestion. Negative for sore throat.   Respiratory: Positive for cough. Negative for shortness of breath.   Cardiovascular: Negative for chest pain.  Gastrointestinal: Negative for diarrhea, nausea and vomiting.  Musculoskeletal: Positive for arthralgias and myalgias.  Neurological: Positive for headaches (left side and back of neck).     Social History   Tobacco Use  Smoking Status Never Smoker  Smokeless Tobacco Never Used        Objective:   BP Readings from Last 3 Encounters:  07/16/19 111/65  04/06/19 92/62  12/16/18 102/78    Wt Readings from Last 3 Encounters:  07/16/19 118 lb (53.5 kg)  04/06/19 123 lb 6.4 oz (56 kg)  12/16/18 124 lb 12.8 oz (56.6 kg)   There were no vitals taken for this visit.   Physical Exam Constitutional:      Appearance: Normal appearance. She is not ill-appearing.  HENT:     Head: Normocephalic and atraumatic.     Right Ear: External ear normal.     Left Ear: External ear normal.     Nose: Congestion present.  Eyes:     Conjunctiva/sclera: Conjunctivae normal.  Pulmonary:     Effort: Pulmonary effort is normal. No respiratory distress.  Neurological:     Mental Status: She is alert. Mental status is at baseline.  Psychiatric:        Mood and Affect: Mood normal.        Behavior: Behavior normal.        Thought Content: Thought content normal.        Judgment: Judgment normal.              Assessment & Plan:   Problem List Items Addressed This Visit    None    Visit Diagnoses    Suspected COVID-19 virus infection    -  Primary     Discussed OTC treatment for viral illness Given symptoms possible covid-19 and would recommend being tested ER precautions given Advised staying out of work and isolating until results have come back Information for contacting [facility] for scheduled testing provided   Return if symptoms  worsen or fail to improve.  Cynthia Noe, MD

## 2019-08-03 NOTE — Patient Instructions (Addendum)
Covid Testing Location options:   [place] options:  Text "COVID" to [phone] - or -  [email or web address] to schedule at one of the [place] locations  [place] to find locations near you: [phone]  How to care for yourself at home:   1) Drink plenty of fluids 2) Get lots of rest 3) Wash your hands regularly - for 20 seconds 4) Cover your mouth when you cough -- ideally cough into your elbow 5) Take tylenol - can do up to 1000 mg every 8 hours (or do smaller doses more frequently) - Avoid Ibuprofen if able 6) Stay home - avoid contact with other people. Ideally have someone bring your groceries, etc.  7) Avoid touching your eyes, nose, mouth 8) Over the counter cold medication may be helpful 9) Saline rinse for congestion  Positive Coronovirus - When isolation ends 1) 10 days have passed from when symptoms started  2) Other symptoms have improved - no longer with cough or shortness of breath 3) At least 24 hours since your last fever without needing any tylenol or ibuprofen  Call the clinic immediately or consider going to the emergency room if:  1) Trouble Breathing 2) Persistent pain or pressure in the chest 3) New confusion or inability to wake 4) Bluish lips or face 5) Notify them that you may have COVID-19

## 2019-08-04 NOTE — Telephone Encounter (Signed)
LMTCB

## 2019-08-05 NOTE — Telephone Encounter (Signed)
Pt returned call. She said you can call her back.

## 2019-08-06 ENCOUNTER — Other Ambulatory Visit: Payer: Self-pay | Admitting: Internal Medicine

## 2019-08-06 ENCOUNTER — Telehealth: Payer: Self-pay | Admitting: Internal Medicine

## 2019-08-06 DIAGNOSIS — U071 COVID-19: Secondary | ICD-10-CM

## 2019-08-06 DIAGNOSIS — Z8616 Personal history of COVID-19: Secondary | ICD-10-CM | POA: Insufficient documentation

## 2019-08-06 MED ORDER — HYDROCOD POLST-CPM POLST ER 10-8 MG/5ML PO SUER: 5.0000 mL | Freq: Two times a day (BID) | ORAL | 0 refills | Status: DC | PRN

## 2019-08-06 MED ORDER — DEXAMETHASONE 6 MG PO TABS: 6.0000 mg | ORAL_TABLET | Freq: Every day | ORAL | 0 refills | Status: DC

## 2019-08-06 MED ORDER — PREDNISONE 10 MG PO TABS: ORAL_TABLET | ORAL | 0 refills | Status: DC

## 2019-08-06 MED ORDER — ALBUTEROL SULFATE HFA 108 (90 BASE) MCG/ACT IN AERS: 2.0000 | INHALATION_SPRAY | Freq: Four times a day (QID) | RESPIRATORY_TRACT | 1 refills | Status: DC | PRN

## 2019-08-06 NOTE — Telephone Encounter (Signed)
Pt tested positive for covid on 08/04/2019. Symptoms started Friday 2/5. Symptoms are cough, chest tightness, SOBr, headache, no smell or taste, body aches, chills, no fever. Pt stated that this morning the cough has worsened and it is a dry cough. Pt is wanting to see if something could be called in to help with her cough and breathing.

## 2019-08-06 NOTE — Telephone Encounter (Signed)
Spoke with pt and informed her of the medication that was sent in and the instructions that Dr. Derrel Nip has given below. Pt is scheduled for a follow up next week. Pt gave a verbal understanding.

## 2019-08-06 NOTE — Telephone Encounter (Signed)
Please see other telephone message  

## 2019-08-06 NOTE — Telephone Encounter (Signed)
Patient has covid and cough is getting worse. She would like something for the cough. No available appointments with any other provider.

## 2019-08-06 NOTE — Telephone Encounter (Signed)
Albuterol inhaler and tussionex cough syrup sent to [facility]   have sent Tussionex cough syrup to her pharmacy it is strongest cough medicine available  Because it contains hydrocodone in it,  It may cause constipation and sedation as side effects  So make sure she has a laxative on hand   Purchase a pulse oximeter,  If sats drop below 90% and stay there she needs to go to ER.    Schedule follow up appt next week if available with me

## 2019-08-07 ENCOUNTER — Telehealth: Payer: Self-pay | Admitting: Nurse Practitioner

## 2019-08-07 NOTE — Telephone Encounter (Signed)
Called to discuss with [name] about Covid symptoms and the use of bamlanivimab, a monoclonal antibody infusion for those with mild to moderate Covid symptoms and at a high risk of hospitalization.   Unable to reach patient. Message left and MyChart message sent.     Upon further chart review pt is not qualified for this infusion due to lack of identified risk factors and co-morbid conditions. Would require further assessment via patient discussion to identify any further qualifying conditions.   Patient Active Problem List   Diagnosis Date Noted  . COVID-19 virus infection 08/06/2019  . Right ear pain 02/04/2019  . Joint pain in fingers of right hand 12/16/2018  . Nonspecific syndrome suggestive of viral illness 10/28/2018  . Diffuse cellulitis of face 04/15/2018  . Intermittent palpitations 12/15/2017  . Generalized anxiety disorder 01/03/2016  . 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 Dyspareunia, female 05/07/2014  . Encounter for routine adult medical exam with abnormal findings 11/20/2012  . Fibrocystic breast disease 09/24/2012  . History of abnormal cervical Pap smear 05/07/2012  . Neutropenia ([place]) 05/07/2012    Alda Lea, AGPCNP-BC Pager: [phone] Amion: N. Cousar

## 2019-08-08 ENCOUNTER — Telehealth: Payer: Self-pay | Admitting: Nurse Practitioner

## 2019-08-08 NOTE — Telephone Encounter (Signed)
Called to discuss with Cynthia Houston about Covid symptoms and the use of bamlanivimab, a monoclonal antibody infusion for those with mild to moderate Covid symptoms and at a high risk of hospitalization.    Cynthia Houston responded to MyChart message indicating discussions with Cynthia Houston PCP and recommendations for infusion. Upon chart review Cynthia Houston is not qualified for this infusion due to lack of identified risk factors and co-morbid conditions.   Attempted to return Cynthia Houston's call to further discuss qualifications. Unable to reach Cynthia Houston. Message left and MyChart message sent.    Cynthia Houston Active Problem List   Diagnosis Date Noted  . COVID-19 virus infection 08/06/2019  . Right ear pain 02/04/2019  . Joint pain in fingers of right hand 12/16/2018  . Nonspecific syndrome suggestive of viral illness 10/28/2018  . Diffuse cellulitis of face 04/15/2018  . Intermittent palpitations 12/15/2017  . Generalized anxiety disorder 01/03/2016  . 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 Dyspareunia, female 05/07/2014  . Encounter for routine adult medical exam with abnormal findings 11/20/2012  . Fibrocystic breast disease 09/24/2012  . History of abnormal cervical Pap smear 05/07/2012  . Neutropenia ([place]) 05/07/2012    Cynthia Houston, AGPCNP-BC Pager: [phone] Amion: N. Cousar

## 2019-08-12 ENCOUNTER — Other Ambulatory Visit: Payer: Self-pay

## 2019-08-12 ENCOUNTER — Encounter: Payer: Self-pay | Admitting: Internal Medicine

## 2019-08-12 ENCOUNTER — Ambulatory Visit (INDEPENDENT_AMBULATORY_CARE_PROVIDER_SITE_OTHER): Payer: Managed Care, Other (non HMO) | Admitting: Internal Medicine

## 2019-08-12 DIAGNOSIS — U071 COVID-19: Secondary | ICD-10-CM | POA: Diagnosis not present

## 2019-08-12 DIAGNOSIS — M2012 Hallux valgus (acquired), left foot: Secondary | ICD-10-CM | POA: Diagnosis not present

## 2019-08-12 NOTE — Assessment & Plan Note (Signed)
She is recovering without steroids,  Monoclonal ab infusion.   Quarantine finished yesterday per HD (Feb 5 to Feb 15)  .  Advised to obtain TSH and chest x ray in 4 weeks of symptoms of dyspnea with exertion and fatigud have not resolved.  COVID VACCINE delayed for 90 days

## 2019-08-12 NOTE — Assessment & Plan Note (Addendum)
S/p Lapidus hallux valgus correction on the left foot along with a second metatarsal osteotomy  in late January by Dr Elvina Mattes .  Follow up x rays are due today and [facility]

## 2019-08-12 NOTE — Progress Notes (Signed)
Virtual Visit via [email or web address]  This visit type was conducted due to national recommendations for restrictions regarding the COVID-19 pandemic (e.g. social distancing).  This format is felt to be most appropriate for this patient at this time.  All issues noted in this document were discussed and addressed.  No physical exam was performed (except for noted visual exam findings with Video Visits).   I connected with@ on 08/12/19 at  8:30 AM EST by a video enabled telemedicine application  and verified that I am speaking with the correct person using two identifiers. Location patient: home Location provider: work or home office Persons participating in the virtual visit: patient, provider  I discussed the limitations, risks, security and privacy concerns of performing an evaluation and management service by telephone and the availability of in person appointments. I also discussed with the patient that there may be a patient responsible charge related to this service. The patient expressed understanding and agreed to proceed.  Reason for visit: follow up on recent COVID 19 infection   HPI:  54 yr old female diagnosed with COVID 19 INFECTION after developing symptoms on Feb 5 of cong, congestion,  Anosmia and dyspea .  Was screened for monoclonal antibody infusion but did not meet criteria.  Did no take steroids although she had requested them.. feeling much better than last week.   Oxygen sats now staying 96% or better ,  Slight dop to 905 last week with exertion..  Main cc is fatigue and dyspnea with exertion  Had Foot surgery Feb 1 by Troxler .  Needs follow up x rays today    ROS: See pertinent positives and negatives per HPI.  Past Medical History:  Diagnosis Date  . Chicken pox   . Depression   . Diffuse cystic mastopathy 2013  . Family history of adverse reaction to anesthesia    Mother - PONV  . Headache(784.0)   . Hx of dysplastic nevus    multiple sites  . Osteoarthritis    right  hand  . PONV (postoperative nausea and vomiting)   . Tendinitis    right elbow  . Urinary tract infection     Past Surgical History:  Procedure Laterality Date  . ABDOMINAL HYSTERECTOMY  2008   supracervical   . Barbie Banner OSTEOTOMY Left 07/16/2019   Procedure: PHALANX OSTEOTOMY AKIN;  Surgeon: Albertine Patricia, DPM;  Location: [place];  Service: Podiatry;  Laterality: Left;  . BREAST CYST ASPIRATION Right    neg  . BUNIONECTOMY Right 2011  . BUNIONECTOMY Left 07/16/2019   Procedure: LAPIDUS-TYPE;  Surgeon: Albertine Patricia, DPM;  Location: [place];  Service: Podiatry;  Laterality: Left;  LMA WITH BLOCK  . COLONOSCOPY  2008   Dr. Vira Agar  . DIAGNOSTIC LAPAROSCOPY    . ECTOPIC PREGNANCY SURGERY  1989  . gynecological surgery   2004  . TUBAL LIGATION    . UTERINE FIBROID SURGERY  2002   x2 2004  . WEIL OSTEOTOMY Left 07/16/2019   Procedure: WEIL OSTEOTOMY;  Surgeon: Albertine Patricia, DPM;  Location: [place];  Service: Podiatry;  Laterality: Left;    Family History  Problem Relation Age of Onset  . Hyperlipidemia Mother   . Hypertension Mother   . Arthritis Mother        rheumatoid arthritis  . Crohn's disease Father   . Heart disease Father 42       CABG  . Hyperlipidemia Father   . Hypertension Father   . Breast  cancer Neg Hx     SOCIAL HX:  Pediatric History  Patient Parents  . Not on file   Other Topics Concern  . Not on file  Social History Narrative  . Not on file     Current Outpatient Medications:  .  albuterol (VENTOLIN HFA) 108 (90 Base) MCG/ACT inhaler, Inhale 2 puffs into the lungs every 6 (six) hours as needed for wheezing or shortness of breath., Disp: 8 g, Rfl: 1 .  BIOTIN PO, Take by mouth daily., Disp: , Rfl:  .  Cholecalciferol (VITAMIN D) 50 MCG (2000 UT) tablet, Take 2,000 Units by mouth daily., Disp: , Rfl:  .  diclofenac Sodium (VOLTAREN) 1 % GEL, Apply topically as needed., Disp: , Rfl:  .  Multiple Vitamin  (MULTIVITAMIN) tablet, Take 1 tablet by mouth daily., Disp: , Rfl:  .  sertraline (ZOLOFT) 50 MG tablet, Take 1 tablet (50 mg total) by mouth daily. (Patient taking differently: Take 25 mg by mouth at bedtime. ), Disp: 90 tablet, Rfl: 1 .  chlorpheniramine-HYDROcodone (TUSSIONEX PENNKINETIC ER) 10-8 MG/5ML SUER, Take 5 mLs by mouth every 12 (twelve) hours as needed. (Patient not taking: Reported on 08/12/2019), Disp: 140 mL, Rfl: 0 .  dexamethasone (DECADRON) 6 MG tablet, Take 1 tablet (6 mg total) by mouth daily. (Patient not taking: Reported on 08/12/2019), Disp: 10 tablet, Rfl: 0 .  [START ON 08/17/2019] predniSONE (DELTASONE) 10 MG tablet, 6 tablets on Day 1 , then reduce by 1 tablet daily until gone (Patient not taking: Reported on 08/12/2019), Disp: 21 tablet, Rfl: 0 .  promethazine (PHENERGAN) 12.5 MG tablet, Take 1 tablet (12.5 mg total) by mouth every 6 (six) hours as needed for nausea or vomiting. (Patient not taking: Reported on 08/12/2019), Disp: 30 tablet, Rfl: 0  EXAM:  VITALS per patient if applicable:  GENERAL: alert, oriented, appears well and in no acute distress  HEENT: atraumatic, conjunttiva clear, no obvious abnormalities on inspection of external nose and ears  NECK: normal movements of the head and neck  LUNGS: on inspection no signs of respiratory distress, breathing rate appears normal, no obvious gross SOB, gasping or wheezing  CV: no obvious cyanosis  MS: moves all visible extremities without noticeable abnormality  PSYCH/NEURO: pleasant and cooperative, no obvious depression or anxiety, speech and thought processing grossly intact  ASSESSMENT AND PLAN:  Discussed the following assessment and plan:  COVID-19 virus infection  Hallux valgus, acquired, left  COVID-19 virus infection  She is recovering without steroids,  Monoclonal ab infusion.   Quarantine finished yesterday per HD (Feb 5 to Feb 15)  .  Advised to obtain TSH and chest x ray in 4 weeks of symptoms  of dyspnea with exertion and fatigud have not resolved.  COVID VACCINE delayed for 90 days    Hallux valgus, acquired, left S/p Lapidus hallux valgus correction on the left foot along with a second metatarsal osteotomy  in late January by Dr Elvina Mattes .  Follow up x rays are due today and [facility]     I discussed the assessment and treatment plan with the patient. The patient was provided an opportunity to ask questions and all were answered. The patient agreed with the plan and demonstrated an understanding of the instructions.   The patient was advised to call back or seek an in-person evaluation if the symptoms worsen or if the condition fails to improve as anticipated. . I provided  30 minutes of non-face-to-face  time during this encounter reviewing patient's current problems and recent surgery.  Providing counseling on the above mentioned problems , and coordination  of care .  Crecencio Mc, MD

## 2019-08-19 ENCOUNTER — Other Ambulatory Visit: Payer: Self-pay

## 2019-08-19 ENCOUNTER — Ambulatory Visit (INDEPENDENT_AMBULATORY_CARE_PROVIDER_SITE_OTHER): Payer: Managed Care, Other (non HMO) | Admitting: Internal Medicine

## 2019-08-19 ENCOUNTER — Encounter: Payer: Self-pay | Admitting: Internal Medicine

## 2019-08-19 VITALS — Ht 63.0 in | Wt 118.0 lb

## 2019-08-19 DIAGNOSIS — U071 COVID-19: Secondary | ICD-10-CM | POA: Diagnosis not present

## 2019-08-19 DIAGNOSIS — R350 Frequency of micturition: Secondary | ICD-10-CM

## 2019-08-19 MED ORDER — CIPROFLOXACIN HCL 250 MG PO TABS: 250.0000 mg | ORAL_TABLET | Freq: Two times a day (BID) | ORAL | 0 refills | Status: DC

## 2019-08-19 NOTE — Assessment & Plan Note (Signed)
All symptoms except anosmia and urinary frequency have resolved.

## 2019-08-19 NOTE — Progress Notes (Signed)
Virtual Visit via [email or web address]  This visit type was conducted due to national recommendations for restrictions regarding the COVID-19 pandemic (e.g. social distancing).  This format is felt to be most appropriate for this patient at this time.  All issues noted in this document were discussed and addressed.  No physical exam was performed (except for noted visual exam findings with Video Visits).   I connected with@ on 08/19/19 at  8:30 AM EST by a video enabled telemedicine application  and verified that I am speaking with the correct person using two identifiers. Location patient: home Location provider: work or home office Persons participating in the virtual visit: patient, provider  I discussed the limitations, risks, security and privacy concerns of performing an evaluation and management service by telephone and the availability of in person appointments. I also discussed with the patient that there may be a patient responsible charge related to this service. The patient expressed understanding and agreed to proceed.  Reason for visit: urinary frequency  HPI:  54 yr old female diagnosed with COVID 19 INFECTION  , mild course,  Quarantined from Feb 5 to Feb 15 presents with urinary frequency and new onset nocturia x 2 , symptoms started during ACTIVE covid INFECTION  WHILE she was increasing her fluid intake,  But have persisted.  No nausea , hematuria,  Dark colored urine,  fevers or dysuria.    ROS: See pertinent positives and negatives per HPI.  Past Medical History:  Diagnosis Date  . Chicken pox   . Depression   . Diffuse cystic mastopathy 2013  . Family history of adverse reaction to anesthesia    Mother - PONV  . Headache(784.0)   . Hx of dysplastic nevus    multiple sites  . Osteoarthritis    right hand  . PONV (postoperative nausea and vomiting)   . Tendinitis    right elbow  . Urinary tract infection     Past Surgical History:  Procedure Laterality Date  . ABDOMINAL  HYSTERECTOMY  2008   supracervical   . Barbie Banner OSTEOTOMY Left 07/16/2019   Procedure: PHALANX OSTEOTOMY AKIN;  Surgeon: Albertine Patricia, DPM;  Location: [place];  Service: Podiatry;  Laterality: Left;  . BREAST CYST ASPIRATION Right    neg  . BUNIONECTOMY Right 2011  . BUNIONECTOMY Left 07/16/2019   Procedure: LAPIDUS-TYPE;  Surgeon: Albertine Patricia, DPM;  Location: [place];  Service: Podiatry;  Laterality: Left;  LMA WITH BLOCK  . COLONOSCOPY  2008   Dr. Vira Agar  . DIAGNOSTIC LAPAROSCOPY    . ECTOPIC PREGNANCY SURGERY  1989  . gynecological surgery   2004  . TUBAL LIGATION    . UTERINE FIBROID SURGERY  2002   x2 2004  . WEIL OSTEOTOMY Left 07/16/2019   Procedure: WEIL OSTEOTOMY;  Surgeon: Albertine Patricia, DPM;  Location: [place];  Service: Podiatry;  Laterality: Left;    Family History  Problem Relation Age of Onset  . Hyperlipidemia Mother   . Hypertension Mother   . Arthritis Mother        rheumatoid arthritis  . Crohn's disease Father   . Heart disease Father 38       CABG  . Hyperlipidemia Father   . Hypertension Father   . Breast cancer Neg Hx     SOCIAL HX:  reports that she has never smoked. She has never used smokeless tobacco. She reports current alcohol use of about 1.0 standard drinks of alcohol per week. She reports  that she does not use drugs.  Current Outpatient Medications:  .  albuterol (VENTOLIN HFA) 108 (90 Base) MCG/ACT inhaler, Inhale 2 puffs into the lungs every 6 (six) hours as needed for wheezing or shortness of breath., Disp: 8 g, Rfl: 1 .  BIOTIN PO, Take by mouth daily., Disp: , Rfl:  .  Cholecalciferol (VITAMIN D) 50 MCG (2000 UT) tablet, Take 2,000 Units by mouth daily., Disp: , Rfl:  .  diclofenac Sodium (VOLTAREN) 1 % GEL, Apply topically as needed., Disp: , Rfl:  .  Multiple Vitamin (MULTIVITAMIN) tablet, Take 1 tablet by mouth daily., Disp: , Rfl:  .  sertraline (ZOLOFT) 50 MG tablet, Take 1 tablet (50 mg  total) by mouth daily. (Patient taking differently: Take 25 mg by mouth at bedtime. ), Disp: 90 tablet, Rfl: 1 .  ciprofloxacin (CIPRO) 250 MG tablet, Take 1 tablet (250 mg total) by mouth 2 (two) times daily., Disp: 6 tablet, Rfl: 0  EXAM:  VITALS per patient if applicable:  GENERAL: alert, oriented, appears well and in no acute distress  HEENT: atraumatic, conjunttiva clear, no obvious abnormalities on inspection of external nose and ears  NECK: normal movements of the head and neck  LUNGS: on inspection no signs of respiratory distress, breathing rate appears normal, no obvious gross SOB, gasping or wheezing  CV: no obvious cyanosis  MS: moves all visible extremities without noticeable abnormality  PSYCH/NEURO: pleasant and cooperative, no obvious depression or anxiety, speech and thought processing grossly intact  ASSESSMENT AND PLAN:  Discussed the following assessment and plan:  Urinary frequency - Plan: Urinalysis, Routine w reflex microscopic, Urine Culture  COVID-19 virus infection  COVID-19 virus infection All symptoms except anosmia and urinary frequency have resolved.   Urinary frequency Without dysuria, infection unlikely,.  Sending UA and culture to [facility] since she cannot come to office until after Feb 26 based on [facility] quarantine rule of 21 days.  sendig cipro to [facility] to use if she develops dysuria    I discussed the assessment and treatment plan with the patient. The patient was provided an opportunity to ask questions and all were answered. The patient agreed with the plan and demonstrated an understanding of the instructions.   The patient was advised to call back or seek an in-person evaluation if the symptoms worsen or if the condition fails to improve as anticipated.  I provided  20 minutes of non-face-to-face time during this encounter reviewing patient's current problems and post surgeries.  Providing counseling on the above mentioned problems ,  and coordination  of care . Crecencio Mc, MD

## 2019-08-19 NOTE — Patient Instructions (Signed)
The urinalysis and culture orders will be send to [facility] this afternoon and will be good for months  You can Start the cipro if you develop burning , nausea or flank pain   If you start the antibiotics:  1) take a probiotic for 3 weeks  2) Do not check a urine ulness symptoms do not resolve after taking cipro

## 2019-08-19 NOTE — Assessment & Plan Note (Signed)
Without dysuria, infection unlikely,.  Sending UA and culture to [facility] since she cannot come to office until after Feb 26 based on [facility] quarantine rule of 21 days.  sendig cipro to [facility] to use if she develops dysuria

## 2019-08-19 NOTE — Telephone Encounter (Signed)
Pt had a virtual visit with Dr. Derrel Nip this morning.

## 2019-08-22 LAB — URINALYSIS, ROUTINE W REFLEX MICROSCOPIC
Bilirubin, UA: NEGATIVE
Glucose, UA: NEGATIVE
Ketones, UA: NEGATIVE
Leukocytes,UA: NEGATIVE
Nitrite, UA: NEGATIVE
Protein,UA: NEGATIVE
RBC, UA: NEGATIVE
Specific Gravity, UA: 1.011 (ref 1.005–1.030)
Urobilinogen, Ur: 0.2 mg/dL (ref 0.2–1.0)
pH, UA: 6 (ref 5.0–7.5)

## 2019-08-22 LAB — URINE CULTURE

## 2019-09-08 DIAGNOSIS — R5383 Other fatigue: Secondary | ICD-10-CM

## 2019-09-15 ENCOUNTER — Telehealth: Payer: Self-pay

## 2019-09-15 DIAGNOSIS — R5383 Other fatigue: Secondary | ICD-10-CM

## 2019-09-15 NOTE — Telephone Encounter (Signed)
Labs reordered for [facility]  

## 2019-09-16 ENCOUNTER — Other Ambulatory Visit: Payer: Self-pay

## 2019-09-16 ENCOUNTER — Other Ambulatory Visit (HOSPITAL_COMMUNITY): Payer: Self-pay | Admitting: Podiatry

## 2019-09-16 ENCOUNTER — Telehealth: Payer: Self-pay | Admitting: Internal Medicine

## 2019-09-16 ENCOUNTER — Other Ambulatory Visit: Payer: Self-pay | Admitting: Podiatry

## 2019-09-16 ENCOUNTER — Ambulatory Visit
Admission: RE | Admit: 2019-09-16 | Discharge: 2019-09-16 | Disposition: A | Discharge status: Home / Self Care | Payer: Managed Care, Other (non HMO) | Source: Ambulatory Visit | Attending: Podiatry | Admitting: Podiatry

## 2019-09-16 DIAGNOSIS — M7989 Other specified soft tissue disorders: Secondary | ICD-10-CM | POA: Insufficient documentation

## 2019-09-16 DIAGNOSIS — M79605 Pain in left leg: Secondary | ICD-10-CM | POA: Diagnosis not present

## 2019-09-16 LAB — THYROID PANEL WITH TSH
Free Thyroxine Index: 1.5 (ref 1.2–4.9)
T3 Uptake Ratio: 29 % (ref 24–39)
T4, Total: 5.3 ug/dL (ref 4.5–12.0)
TSH: 2.13 u[IU]/mL (ref 0.450–4.500)

## 2019-09-16 LAB — SAR COV2 SEROLOGY (COVID19)AB(IGG),IA: DiaSorin SARS-CoV-2 Ab, IgG: POSITIVE

## 2019-09-16 NOTE — Telephone Encounter (Signed)
Labs reviewed and discussed via mychart.

## 2019-10-06 MED ORDER — SERTRALINE HCL 50 MG PO TABS: 50.0000 mg | ORAL_TABLET | Freq: Every day | ORAL | 1 refills | Status: DC

## 2019-12-21 ENCOUNTER — Other Ambulatory Visit: Payer: Self-pay

## 2019-12-21 ENCOUNTER — Ambulatory Visit (INDEPENDENT_AMBULATORY_CARE_PROVIDER_SITE_OTHER): Payer: Managed Care, Other (non HMO)

## 2019-12-21 ENCOUNTER — Ambulatory Visit (INDEPENDENT_AMBULATORY_CARE_PROVIDER_SITE_OTHER): Payer: Managed Care, Other (non HMO) | Admitting: Internal Medicine

## 2019-12-21 ENCOUNTER — Encounter: Payer: Self-pay | Admitting: Internal Medicine

## 2019-12-21 VITALS — BP 110/68 | HR 49 | Temp 97.8°F | Resp 14 | Ht 63.0 in | Wt 117.2 lb

## 2019-12-21 DIAGNOSIS — Z1231 Encounter for screening mammogram for malignant neoplasm of breast: Secondary | ICD-10-CM | POA: Diagnosis not present

## 2019-12-21 DIAGNOSIS — M2012 Hallux valgus (acquired), left foot: Secondary | ICD-10-CM | POA: Diagnosis not present

## 2019-12-21 DIAGNOSIS — Z0000 Encounter for general adult medical examination without abnormal findings: Secondary | ICD-10-CM

## 2019-12-21 DIAGNOSIS — Z8742 Personal history of other diseases of the female genital tract: Secondary | ICD-10-CM

## 2019-12-21 DIAGNOSIS — R002 Palpitations: Secondary | ICD-10-CM

## 2019-12-21 DIAGNOSIS — Z0001 Encounter for general adult medical examination with abnormal findings: Secondary | ICD-10-CM | POA: Diagnosis not present

## 2019-12-21 DIAGNOSIS — Z8616 Personal history of COVID-19: Secondary | ICD-10-CM | POA: Diagnosis not present

## 2019-12-21 DIAGNOSIS — R0600 Dyspnea, unspecified: Secondary | ICD-10-CM

## 2019-12-21 DIAGNOSIS — Z8371 Family history of colonic polyps: Secondary | ICD-10-CM | POA: Diagnosis not present

## 2019-12-21 DIAGNOSIS — Z124 Encounter for screening for malignant neoplasm of cervix: Secondary | ICD-10-CM

## 2019-12-21 DIAGNOSIS — D709 Neutropenia, unspecified: Secondary | ICD-10-CM

## 2019-12-21 MED ORDER — PROPRANOLOL HCL 10 MG PO TABS: 10.0000 mg | ORAL_TABLET | Freq: Three times a day (TID) | ORAL | 0 refills | Status: DC

## 2019-12-21 NOTE — Patient Instructions (Addendum)
Your annual mammogram has been ordered.  You are encouraged (required) to call to make your appointment at [facility] [phone]  Dr Bary Castilla (my favorite surgeon/colonoscopy doer) has recently joined [facility] ; referral for colonoscopy is in progress   Health Maintenance for Postmenopausal Women Menopause is a normal process in which your ability to get pregnant comes to an end. This process happens slowly over many months or years, usually between the ages of 30 and 61. Menopause is complete when you have missed your menstrual periods for 12 months. It is important to talk with your health care provider about some of the most common conditions that affect women after menopause (postmenopausal women). These include heart disease, cancer, and bone loss (osteoporosis). Adopting a healthy lifestyle and getting preventive care can help to promote your health and wellness. The actions you take can also lower your chances of developing some of these common conditions. What should I know about menopause? During menopause, you may get a number of symptoms, such as:  Hot flashes. These can be moderate or severe.  Night sweats.  Decrease in sex drive.  Mood swings.  Headaches.  Tiredness.  Irritability.  Memory problems.  Insomnia. Choosing to treat or not to treat these symptoms is a decision that you make with your health care provider. Do I need hormone replacement therapy?  Hormone replacement therapy is effective in treating symptoms that are caused by menopause, such as hot flashes and night sweats.  Hormone replacement carries certain risks, especially as you become older. If you are thinking about using estrogen or estrogen with progestin, discuss the benefits and risks with your health care provider. What is my risk for heart disease and stroke? The risk of heart disease, heart attack, and stroke increases as you age. One of the causes may be a change in the body's hormones  during menopause. This can affect how your body uses dietary fats, triglycerides, and cholesterol. Heart attack and stroke are medical emergencies. There are many things that you can do to help prevent heart disease and stroke. Watch your blood pressure  High blood pressure causes heart disease and increases the risk of stroke. This is more likely to develop in people who have high blood pressure readings, are of African descent, or are overweight.  Have your blood pressure checked: ? Every 3-5 years if you are 58-40 years of age. ? Every year if you are 40 years old or older. Eat a healthy diet   Eat a diet that includes plenty of vegetables, fruits, low-fat dairy products, and lean protein.  Do not eat a lot of foods that are high in solid fats, added sugars, or sodium. Get regular exercise Get regular exercise. This is one of the most important things you can do for your health. Most adults should:  Try to exercise for at least 150 minutes each week. The exercise should increase your heart rate and make you sweat (moderate-intensity exercise).  Try to do strengthening exercises at least twice each week. Do these in addition to the moderate-intensity exercise.  Spend less time sitting. Even light physical activity can be beneficial. Other tips  Work with your health care provider to achieve or maintain a healthy weight.  Do not use any products that contain nicotine or tobacco, such as cigarettes, e-cigarettes, and chewing tobacco. If you need help quitting, ask your health care provider.  Know your numbers. Ask your health care provider to check your cholesterol and your blood  sugar (glucose). Continue to have your blood tested as directed by your health care provider. Do I need screening for cancer? Depending on your health history and family history, you may need to have cancer screening at different stages of your life. This may include screening for:  Breast cancer.  Cervical  cancer.  Lung cancer.  Colorectal cancer. What is my risk for osteoporosis? After menopause, you may be at increased risk for osteoporosis. Osteoporosis is a condition in which bone destruction happens more quickly than new bone creation. To help prevent osteoporosis or the bone fractures that can happen because of osteoporosis, you may take the following actions:  If you are 55-22 years old, get at least 1,000 mg of calcium and at least 600 mg of vitamin D per day.  If you are older than age 80 but younger than age 62, get at least 1,200 mg of calcium and at least 600 mg of vitamin D per day.  If you are older than age 69, get at least 1,200 mg of calcium and at least 800 mg of vitamin D per day. Smoking and drinking excessive alcohol increase the risk of osteoporosis. Eat foods that are rich in calcium and vitamin D, and do weight-bearing exercises several times each week as directed by your health care provider. How does menopause affect my mental health? Depression may occur at any age, but it is more common as you become older. Common symptoms of depression include:  Low or sad mood.  Changes in sleep patterns.  Changes in appetite or eating patterns.  Feeling an overall lack of motivation or enjoyment of activities that you previously enjoyed.  Frequent crying spells. Talk with your health care provider if you think that you are experiencing depression. General instructions See your health care provider for regular wellness exams and vaccines. This may include:  Scheduling regular health, dental, and eye exams.  Getting and maintaining your vaccines. These include: ? Influenza vaccine. Get this vaccine each year before the flu season begins. ? Pneumonia vaccine. ? Shingles vaccine. ? Tetanus, diphtheria, and pertussis (Tdap) booster vaccine. Your health care provider may also recommend other immunizations. Tell your health care provider if you have ever been abused or do  not feel safe at home. Summary  Menopause is a normal process in which your ability to get pregnant comes to an end.  This condition causes hot flashes, night sweats, decreased interest in sex, mood swings, headaches, or lack of sleep.  Treatment for this condition may include hormone replacement therapy.  Take actions to keep yourself healthy, including exercising regularly, eating a healthy diet, watching your weight, and checking your blood pressure and blood sugar levels.  Get screened for cancer and depression. Make sure that you are up to date with all your vaccines. This information is not intended to replace advice given to you by your health care provider. Make sure you discuss any questions you have with your health care provider. Document Revised: 06/04/2018 Document Reviewed: 06/04/2018 Elsevier Patient Education  2020 [employer].

## 2019-12-21 NOTE — Assessment & Plan Note (Addendum)
She recovered but continues to report exertional dyspnea and anosmia .  covid antibody test ordered

## 2019-12-21 NOTE — Progress Notes (Addendum)
Patient ID: Cynthia Houston, female    DOB: 17-Nov-1965  Age: 54 y.o. MRN: [id number]  The patient is here for annual preventive examination and management of other chronic and acute problems.   The risk factors are reflected in the social history.  The roster of all physicians providing medical care to patient - is listed in the Snapshot section of the chart.  Activities of daily living:  The patient is 100% independent in all ADLs: dressing, toileting, feeding as well as independent mobility  Home safety : The patient has smoke detectors in the home. They wear seatbelts.  There are no firearms at home. There is no violence in the home.   There is no risks for hepatitis, STDs or HIV. There is no   history of blood transfusion. They have no travel history to infectious disease endemic areas of the world.  The patient has seen their dentist in the last six month. They have seen their eye doctor in the last year. They admit to slight hearing difficulty with regard to whispered voices and some television programs.  They have deferred audiologic testing in the last year.  They do not  have excessive sun exposure. Discussed the need for sun protection: hats, long sleeves and use of sunscreen if there is significant sun exposure.   Diet: the importance of a healthy diet is discussed. They do have a healthy diet.  The benefits of regular aerobic exercise were discussed. She has not been able to exercise since her left foot surgery in January due to swelling, pain and limited flexion of great toe.   Depression screen: there are no signs or vegative symptoms of depression- irritability, change in appetite, anhedonia, sadness/tearfullness.  The following portions of the patient's history were reviewed and updated as appropriate: allergies, current medications, past family history, past medical history,  past surgical history, past social history  and problem list.  Visual acuity was not assessed per  patient preference since she has regular follow up with her ophthalmologist. Hearing and body mass index were assessed and reviewed.   During the course of the visit the patient was educated and counseled about appropriate screening and preventive services including : fall prevention , diabetes screening, nutrition counseling, colorectal cancer screening, and recommended immunizations.    CC: The primary encounter diagnosis was Encounter for preventive health examination. Diagnoses of Hallux valgus, acquired, left, Cervical cancer screening, Family history of polyps in the colon, Encounter for screening mammogram for malignant neoplasm of breast, Intermittent palpitations, Encounter for routine adult medical exam with abnormal findings, Personal history of covid-19, Dyspnea, unspecified type, Neutropenia, unspecified type ([place]), and History of abnormal cervical Pap smear were also pertinent to this visit.  1) RAPID heart rate episodes have been occurring daily for the last 4 weeks.  The episodes last < 5 minutes and are often resolved after a spontaneous cough.  She drinks coffee in the morning and one glass of wine at night.    2) Stress:  Wakes up with jaw feeling tight, waking up teeth clenched   3) Anxiety:  Husband has been diagnosed with critical CAD and needs more stents   4) recovered from [place] in February.  Still short of breath at times with running up stairs.  Still has no sense of taste or smell   History Cynthia Houston has a past medical history of Chicken pox, Depression, Diffuse cystic mastopathy (2013), Family history of adverse reaction to anesthesia, Headache(784.0), dysplastic nevus, Osteoarthritis, PONV (postoperative nausea  and vomiting), Tendinitis, and Urinary tract infection.   She has a past surgical history that includes Ectopic pregnancy surgery (1989); Uterine fibroid surgery (2002); Abdominal hysterectomy (2008); gynecological surgery  (2004); Diagnostic laparoscopy; Tubal  ligation; Bunionectomy (Right, 2011); Colonoscopy (2008); Breast cyst aspiration (Right); Bunionectomy (Left, 07/16/2019); [place] osteotomy (Left, 07/16/2019); and Weil osteotomy (Left, 07/16/2019).   Her family history includes Arthritis in her mother; Crohn's disease in her father; Heart disease (age of onset: 76) in her father; Hyperlipidemia in her father and mother; Hypertension in her father and mother.She reports that she has never smoked. She has never used smokeless tobacco. She reports current alcohol use of about 1.0 standard drink of alcohol per week. She reports that she does not use drugs.  Outpatient Medications Prior to Visit  Medication Sig Dispense Refill  . BIOTIN PO Take by mouth daily.    . Cholecalciferol (VITAMIN D) 50 MCG (2000 UT) tablet Take 2,000 Units by mouth daily.    . Multiple Vitamin (MULTIVITAMIN) tablet Take 1 tablet by mouth daily.    . sertraline (ZOLOFT) 50 MG tablet Take 1 tablet (50 mg total) by mouth daily. 90 tablet 1  . albuterol (VENTOLIN HFA) 108 (90 Base) MCG/ACT inhaler Inhale 2 puffs into the lungs every 6 (six) hours as needed for wheezing or shortness of breath. (Patient not taking: Reported on 12/21/2019) 8 g 1  . ciprofloxacin (CIPRO) 250 MG tablet Take 1 tablet (250 mg total) by mouth 2 (two) times daily. (Patient not taking: Reported on 12/21/2019) 6 tablet 0  . diclofenac Sodium (VOLTAREN) 1 % GEL Apply topically as needed. (Patient not taking: Reported on 12/21/2019)     No facility-administered medications prior to visit.    Review of Systems   Patient denies headache, fevers, malaise, unintentional weight loss, skin rash, eye pain, sinus congestion and sinus pain, sore throat, dysphagia,  hemoptysis , cough, dyspnea, wheezing, chest pain, palpitations, orthopnea, edema, abdominal pain, nausea, melena, diarrhea, constipation, flank pain, dysuria, hematuria, urinary  Frequency, nocturia, numbness, tingling, seizures,  Focal weakness, Loss of  consciousness,  Tremor, insomnia, depression, anxiety, and suicidal ideation.      Objective:  BP 110/68 (BP Location: Left Arm, Patient Position: Sitting, Cuff Size: Normal)   Pulse (!) 49   Temp 97.8 F (36.6 C) (Temporal)   Resp 14   Ht 5\' 3"  (1.6 m)   Wt 117 lb 3.2 oz (53.2 kg)   SpO2 98%   BMI 20.76 kg/m   Physical Exam  General Appearance:    Alert, cooperative, no distress, appears stated age  Head:    Normocephalic, without obvious abnormality, atraumatic  Eyes:    PERRL, conjunctiva/corneas clear, EOM's intact, fundi    benign, both eyes  Ears:    Normal TM's and external ear canals, both ears  Nose:   Nares normal, septum midline, mucosa normal, no drainage    or sinus tenderness  Throat:   Lips, mucosa, and tongue normal; teeth and gums normal  Neck:   Supple, symmetrical, trachea midline, no adenopathy;    thyroid:  no enlargement/tenderness/nodules; no carotid   bruit or JVD  Back:     Symmetric, no curvature, ROM normal, no CVA tenderness  Lungs:     Clear to auscultation bilaterally, respirations unlabored  Chest Wall:    No tenderness or deformity   Heart:    Regular rate and rhythm, S1 and S2 normal, no murmur, rub   or gallop  Breast Exam:  No tenderness, masses, or nipple abnormality  Abdomen:     Soft, non-tender, bowel sounds active all four quadrants,    no masses, no organomegaly  Genitalia:    Pelvic: cervix is retroverted and not visible , external genitalia normal, no adnexal masses or tenderness, no cervical motion tenderness, rectovaginal septum normal, uterus normal size, shape, and consistency and vaginal vault narrow, requiring use of pediatric scope.  normal without discharge. PAP smear done with difficulty   Extremities:   Left foot with mild edema noted distally. ,  Well healed surgical scar over first metatarsal . Great toe with limited flexion. Other Extremities normal, atraumatic, no cyanosis or edema  Pulses:   2+ and symmetric all  extremities  Skin:   Skin color, texture, turgor normal, no rashes or lesions  Lymph nodes:   Cervical, supraclavicular, and axillary nodes normal  Neurologic:   CNII-XII intact, normal strength, sensation and reflexes    throughout     Assessment & Plan:   Problem List Items Addressed This Visit      Unprioritized   History of abnormal cervical Pap smear    Repeat PAP was done today with difficulty deu to narrow vault and retroverted cervix.       Neutropenia ([place])    She has had a history of mild leukopenia with mild neutropenia without recurrent bacterial infections that was not present  on last year's labs.  Lab Results  Component Value Date   WBC 3.7 12/19/2017   HGB 13.7 12/19/2017   HCT 40.6 12/19/2017   MCV 90 12/19/2017   PLT 239 12/19/2017         Encounter for routine adult medical exam with abnormal findings    age appropriate education and counseling updated, referrals for preventative services and immunizations addressed, dietary and smoking counseling addressed, most recent labs reviewed.  I have personally reviewed and have noted:  1) the patient's medical and social history 2) The pt's use of alcohol, tobacco, and illicit drugs 3) The patient's current medications and supplements 4) Functional ability including ADL's, fall risk, home safety risk, hearing and visual impairment 5) Diet and physical activities 6) Evidence for depression or mood disorder 7) The patient's height, weight, and BMI have been recorded in the chart  I have made referrals, and provided counseling and education based on review of the above      Intermittent palpitations    Now occurring daily, not nocturnally .  Advised to limit caffeine and alcohol to one cup coffee daily and one glass of wine nightly.  Referral  to cardiology for Holter monitor .  Screening labs ordered,  And low dose propranolol for prn use given       Relevant Orders   Ambulatory referral to Cardiology    Hallux valgus, acquired, left    She is status post Lapidus hallux valgus correction second metatarsal osteotomy to the left foot  Done in January by Troxler . She continues to have swelling and limited flexion of great toe which inhibits exercise       Personal history of covid-19    She recovered but continues to report exertional dyspnea and anosmia .  covid antibody test ordered       Relevant Orders   SARS-CoV-2 Semi-Quantitative Total Antibody, Spike   DG Chest 2 View (Completed)    Other Visit Diagnoses    Encounter for preventive health examination    -  Primary   Relevant Orders   Lipid  panel   Comprehensive metabolic panel   Hemoglobin A1c   Cervical cancer screening       Relevant Orders   Pap liquid-based and HPV (high risk)   Family history of polyps in the colon       Relevant Orders   Ambulatory referral to Gastroenterology   Encounter for screening mammogram for malignant neoplasm of breast       Relevant Orders   MM 3D SCREEN BREAST BILATERAL   Dyspnea, unspecified type       Relevant Orders   DG Chest 2 View (Completed)      I have discontinued Asencion Noble. Cynthia Houston's diclofenac Sodium, albuterol, and ciprofloxacin. I am also having her start on propranolol. Additionally, I am having her maintain her multivitamin, BIOTIN PO, Vitamin D, and sertraline.  Meds ordered this encounter  Medications  . propranolol (INDERAL) 10 MG tablet    Sig: Take 1 tablet (10 mg total) by mouth 3 (three) times daily. As needed for rapid heart rate    Dispense:  30 tablet    Refill:  0    Medications Discontinued During This Encounter  Medication Reason  . albuterol (VENTOLIN HFA) 108 (90 Base) MCG/ACT inhaler Patient has not taken in last 30 days  . ciprofloxacin (CIPRO) 250 MG tablet Completed Course  . diclofenac Sodium (VOLTAREN) 1 % GEL Patient has not taken in last 30 days    Follow-up: No follow-ups on file.   Crecencio Mc, MD

## 2019-12-21 NOTE — Assessment & Plan Note (Addendum)
Now occurring daily, not nocturnally .  Advised to limit caffeine and alcohol to one cup coffee daily and one glass of wine nightly.  Referral  to cardiology for Holter monitor .  Screening labs ordered,  And low dose propranolol for prn use given

## 2019-12-21 NOTE — Assessment & Plan Note (Addendum)
She is status post Lapidus hallux valgus correction second metatarsal osteotomy to the left foot  Done in January by Troxler . She continues to have swelling and limited flexion of great toe which inhibits exercise

## 2019-12-21 NOTE — Assessment & Plan Note (Addendum)

## 2019-12-22 NOTE — Assessment & Plan Note (Signed)
Repeat PAP was done today with difficulty deu to narrow vault and retroverted cervix.

## 2019-12-22 NOTE — Assessment & Plan Note (Signed)
She has had a history of mild leukopenia with mild neutropenia without recurrent bacterial infections that was not present  on last year's labs.  Lab Results  Component Value Date   WBC 3.7 12/19/2017   HGB 13.7 12/19/2017   HCT 40.6 12/19/2017   MCV 90 12/19/2017   PLT 239 12/19/2017

## 2019-12-24 NOTE — Addendum Note (Signed)
Addended by: Crecencio Mc on: 12/24/2019 10:45 AM   Modules accepted: Orders

## 2019-12-25 LAB — IGP, APTIMA HPV: HPV Aptima: NEGATIVE

## 2020-01-07 LAB — COMPREHENSIVE METABOLIC PANEL
ALT: 37 IU/L — ABNORMAL HIGH (ref 0–32)
AST: 24 IU/L (ref 0–40)
Albumin/Globulin Ratio: 2 (ref 1.2–2.2)
Albumin: 4.5 g/dL (ref 3.8–4.9)
Alkaline Phosphatase: 72 IU/L (ref 48–121)
BUN/Creatinine Ratio: 20 (ref 9–23)
BUN: 16 mg/dL (ref 6–24)
Bilirubin Total: 0.3 mg/dL (ref 0.0–1.2)
CO2: 25 mmol/L (ref 20–29)
Calcium: 9.6 mg/dL (ref 8.7–10.2)
Chloride: 102 mmol/L (ref 96–106)
Creatinine, Ser: 0.82 mg/dL (ref 0.57–1.00)
GFR calc Af Amer: 93 mL/min/{1.73_m2} (ref >59)
GFR calc non Af Amer: 80 mL/min/{1.73_m2} (ref >59)
Globulin, Total: 2.3 g/dL (ref 1.5–4.5)
Glucose: 91 mg/dL (ref 65–99)
Potassium: 5 mmol/L (ref 3.5–5.2)
Sodium: 139 mmol/L (ref 134–144)
Total Protein: 6.8 g/dL (ref 6.0–8.5)

## 2020-01-07 LAB — HEMOGLOBIN A1C
Est. average glucose Bld gHb Est-mCnc: 114 mg/dL
Hgb A1c MFr Bld: 5.6 % (ref 4.8–5.6)

## 2020-01-07 LAB — LIPID PANEL
Chol/HDL Ratio: 2.5 ratio (ref 0.0–4.4)
Cholesterol, Total: 210 mg/dL — ABNORMAL HIGH (ref 100–199)
HDL: 83 mg/dL (ref >39)
LDL Chol Calc (NIH): 117 mg/dL — ABNORMAL HIGH (ref 0–99)
Triglycerides: 53 mg/dL (ref 0–149)
VLDL Cholesterol Cal: 10 mg/dL (ref 5–40)

## 2020-01-07 LAB — SARS-COV-2 SEMI-QUANTITATIVE TOTAL ANTIBODY, SPIKE
SARS-CoV-2 Semi-Quant Total Ab: 197.3 U/mL (ref <0.8)
SARS-CoV-2 Spike Ab Interp: POSITIVE

## 2020-01-10 ENCOUNTER — Encounter: Payer: Self-pay | Admitting: Internal Medicine

## 2020-02-02 ENCOUNTER — Other Ambulatory Visit: Payer: Self-pay

## 2020-02-02 ENCOUNTER — Encounter: Payer: Self-pay | Admitting: Cardiovascular Disease

## 2020-02-02 ENCOUNTER — Ambulatory Visit (INDEPENDENT_AMBULATORY_CARE_PROVIDER_SITE_OTHER): Payer: Managed Care, Other (non HMO)

## 2020-02-02 ENCOUNTER — Ambulatory Visit: Payer: Managed Care, Other (non HMO) | Admitting: Cardiovascular Disease

## 2020-02-02 VITALS — BP 114/80 | HR 61 | Ht 63.0 in | Wt 119.1 lb

## 2020-02-02 DIAGNOSIS — E782 Mixed hyperlipidemia: Secondary | ICD-10-CM | POA: Diagnosis not present

## 2020-02-02 DIAGNOSIS — F4329 Adjustment disorder with other symptoms: Secondary | ICD-10-CM

## 2020-02-02 DIAGNOSIS — I479 Paroxysmal tachycardia, unspecified: Secondary | ICD-10-CM

## 2020-02-02 NOTE — Patient Instructions (Addendum)
We will place a zio monitor for paroxysmal tachycardia  Medication Instructions:  No changes  If you need a refill on your cardiac medications before your next appointment, please call your pharmacy.    Lab work: No new labs needed   If you have labs (blood work) drawn today and your tests are completely normal, you will receive your results only by: Marland Kitchen MyChart Message (if you have MyChart) OR . A paper copy in the mail If you have any lab test that is abnormal or we need to change your treatment, we will call you to review the results.   Testing/Procedures: Your physician has recommended that you wear a Zio monitor. This monitor is a medical device that records the heart's electrical activity. Doctors most often use these monitors to diagnose arrhythmias. Arrhythmias are problems with the speed or rhythm of the heartbeat. The monitor is a small device applied to your chest. You can wear one while you do your normal daily activities. While wearing this monitor if you have any symptoms to push the button and record what you felt. Once you have worn this monitor for the period of time provider prescribed (Usually 14 days), you will return the monitor device in the postage paid box. Once it is returned they will download the data collected and provide [place] with a report which the provider will then review and we will call you with those results. Important tips:  1. Avoid showering during the first 24 hours of wearing the monitor. 2. Avoid excessive sweating to help maximize wear time. 3. Do not submerge the device, no hot tubs, and no swimming pools. 4. Keep any lotions or oils away from the patch. 5. After 24 hours you may shower with the patch on. Take brief showers with your back facing the shower head.  6. Do not remove patch once it has been placed because that will interrupt data and decrease adhesive wear time. 7. Push the button when you have any symptoms and write down what you were  feeling. 8. Once you have completed wearing your monitor, remove and place into box which has postage paid and place in your outgoing mailbox.  9. If for some reason you have misplaced your box then call our office and we can provide another box and/or mail it off for you.         Follow-Up: At [facility], you and your health needs are our priority.  As part of our continuing mission to provide you with exceptional heart care, we have created designated Provider Care Teams.  These Care Teams include your primary Cardiologist (physician) and Advanced Practice Providers (APPs -  Physician Assistants and Nurse Practitioners) who all work together to provide you with the care you need, when you need it.  . You will need a follow up appointment : as needed   . Providers on your designated Care Team:   . Murray Hodgkins, NP . Christell Faith, PA-C . Marrianne Mood, PA-C  Any Other Special Instructions Will Be Listed Below (If Applicable).  COVID-19 Vaccine Information can be found at: [email or web address] For questions related to vaccine distribution or appointments, please email [email or web address] or call [phone].

## 2020-02-02 NOTE — Progress Notes (Signed)
Cardiology Office Note  Date:  02/02/2020   ID:  STALEY BUDZINSKI, DOB [birth year], MRN [id number]  PCP:  Crecencio Mc, MD   Chief Complaint  Patient presents with  . New Patient (Initial Visit)    Referred by Dr. Derrel Nip for palpitations with occassional SOB that seems to resolve after clearing her throat or coughing; Meds verbally reviewed with patient.    HPI:  [name] is a 54 year old woman with no significant past cardiac history Who presents by referral from Dr. Derrel Nip for consultation of her paroxysmal tachycardia  Symptoms started June 2021 Reported having tachycardia lasting 5 to 6 minutes at a time No rhyme or reason, presented anytime Thought it was related to stress Appreciated a tachycardia/palpitations in her chest radiating up into the throat cough will help temporarily, arrhythmia would recur  Significant stressors recently, husband with disease of aorta requiring intervention, likely will need bypass surgery early next week Has not been sleeping well Clenching her teeth likely at night, possible TMJ  Recovered from Covid in February 2021 Some shortness of breath with heavy exertion  Small amounts of coffee and wine per notes  Otherwise has good ADLs, no significant cardiac risk factors such as smoking or diabetes  EKG personally reviewed by myself on todays visit Shows normal sinus rhythm rate 61 bpm no significant ST or T wave changes   PMH:   has a past medical history of Chicken pox, Depression, Diffuse cystic mastopathy (2013), Family history of adverse reaction to anesthesia, Headache(784.0), dysplastic nevus, Osteoarthritis, PONV (postoperative nausea and vomiting), Tendinitis, and Urinary tract infection.  PSH:    Past Surgical History:  Procedure Laterality Date  . ABDOMINAL HYSTERECTOMY  2008   supracervical   . Barbie Banner OSTEOTOMY Left 07/16/2019   Procedure: PHALANX OSTEOTOMY AKIN;  Surgeon: Albertine Patricia, DPM;  Location: [facility];  Service: Podiatry;  Laterality: Left;  . BREAST CYST ASPIRATION Right    neg  . BUNIONECTOMY Right 2011  . BUNIONECTOMY Left 07/16/2019   Procedure: LAPIDUS-TYPE;  Surgeon: Albertine Patricia, DPM;  Location: [facility];  Service: Podiatry;  Laterality: Left;  LMA WITH BLOCK  . COLONOSCOPY  2008   Dr. Vira Agar  . DIAGNOSTIC LAPAROSCOPY    . ECTOPIC PREGNANCY SURGERY  1989  . gynecological surgery   2004  . TUBAL LIGATION    . UTERINE FIBROID SURGERY  2002   x2 2004  . WEIL OSTEOTOMY Left 07/16/2019   Procedure: WEIL OSTEOTOMY;  Surgeon: Albertine Patricia, DPM;  Location: [facility];  Service: Podiatry;  Laterality: Left;    Current Outpatient Medications  Medication Sig Dispense Refill  . BIOTIN PO Take by mouth daily.    . Cholecalciferol (VITAMIN D) 50 MCG (2000 UT) tablet Take 2,000 Units by mouth daily.    . Multiple Vitamin (MULTIVITAMIN) tablet Take 1 tablet by mouth daily.    . propranolol (INDERAL) 10 MG tablet Take 1 tablet (10 mg total) by mouth 3 (three) times daily. As needed for rapid heart rate 30 tablet 0  . sertraline (ZOLOFT) 50 MG tablet Take 1 tablet (50 mg total) by mouth daily. 90 tablet 1   No current facility-administered medications for this visit.     Allergies:   Sulfa antibiotics, Fiorinal [butalbital-aspirin-caffeine], Percocet [oxycodone-acetaminophen], Augmentin [amoxicillin-pot clavulanate], and Doxycycline   Social History:  The patient  reports that she has never smoked. She has never used smokeless tobacco. She reports current alcohol use of about 1.0 standard drink  of alcohol per week. She reports that she does not use drugs.   Family History:   family history includes Arthritis in her mother; Crohn's disease in her father; Heart disease (age of onset: 28) in her father; Hyperlipidemia in her father and mother; Hypertension in her father and mother.    Review of Systems: Review of Systems  Constitutional: Negative.   HENT:  Negative.   Respiratory: Negative.   Cardiovascular: Positive for palpitations.       Tachycardia  Gastrointestinal: Negative.   Musculoskeletal: Negative.   Neurological: Negative.   Psychiatric/Behavioral: Negative.   All other systems reviewed and are negative.    PHYSICAL EXAM: VS:  BP 114/80 (BP Location: Right Arm, Patient Position: Sitting, Cuff Size: Normal)   Pulse 61   Ht 5\' 3"  (1.6 m)   Wt 119 lb 2 oz (54 kg)   SpO2 98%   BMI 21.10 kg/m  , BMI Body mass index is 21.1 kg/m. GEN: Well nourished, well developed, in no acute distress HEENT: normal Neck: no JVD, carotid bruits, or masses Cardiac: RRR; no murmurs, rubs, or gallops,no edema  Respiratory:  clear to auscultation bilaterally, normal work of breathing GI: soft, nontender, nondistended, + BS MS: no deformity or atrophy Skin: warm and dry, no rash Neuro:  Strength and sensation are intact Psych: euthymic mood, full affect   Recent Labs: 09/15/2019: TSH 2.130 01/06/2020: ALT 37; BUN 16; Creatinine, Ser 0.82; Potassium 5.0; Sodium 139    Lipid Panel Lab Results  Component Value Date   CHOL 210 (H) 01/06/2020   HDL 83 01/06/2020   LDLCALC 117 (H) 01/06/2020   TRIG 53 01/06/2020      Wt Readings from Last 3 Encounters:  02/02/20 119 lb 2 oz (54 kg)  12/21/19 117 lb 3.2 oz (53.2 kg)  08/19/19 118 lb (53.5 kg)       ASSESSMENT AND PLAN:  Problem List Items Addressed This Visit    None    Visit Diagnoses    Paroxysmal tachycardia, unspecified (HCC)    -  Primary   Relevant Orders   EKG 12-Lead   LONG TERM MONITOR (3-14 DAYS)   Stress and adjustment reaction       Mixed hyperlipidemia         Paroxysmal tachycardia Etiology unclear, suspect could be either atrial tachycardia or SVT Possibly induced in the setting of recent stressors, husband with underlying medical issues Discussed various options, we have ordered a ZIO monitor for further evaluation of her arrhythmia. She does have  prescription for propranolol which she can take as needed if symptoms persist Discussed other types of beta-blockers Discussed stress and effect on the heart and arrhythmia  Stress/adjustment reaction Symptoms may improve as husband undergoes his procedure early next week and recovers Has been a very stressful period for her  Hyperlipidemia Discussed various types of screening studies, has very few risk factors for cardiac disease, non-smoker, no diabetes We did discuss CT coronary calcium scoring but would have low suspicion of underlying cardiac disease  We will call her with the results of the monitor   Total encounter time more than 60 minutes  Greater than 50% was spent in counseling and coordination of care with the patient  Patient was seen in consultation for Dr. Derrel Nip referred back to her office for ongoing care of the issues detailed above  Signed, Esmond Plants, M.D., Ph.D. [place], [place]

## 2020-02-03 ENCOUNTER — Ambulatory Visit: Payer: Managed Care, Other (non HMO)

## 2020-03-29 ENCOUNTER — Encounter: Payer: Self-pay | Admitting: Family Medicine

## 2020-03-29 ENCOUNTER — Telehealth (INDEPENDENT_AMBULATORY_CARE_PROVIDER_SITE_OTHER): Payer: Managed Care, Other (non HMO) | Admitting: Family Medicine

## 2020-03-29 ENCOUNTER — Other Ambulatory Visit: Payer: Self-pay

## 2020-03-29 DIAGNOSIS — H9201 Otalgia, right ear: Secondary | ICD-10-CM | POA: Insufficient documentation

## 2020-03-29 MED ORDER — CEFDINIR 300 MG PO CAPS: 300.0000 mg | ORAL_CAPSULE | Freq: Two times a day (BID) | ORAL | 0 refills | Status: DC

## 2020-03-29 NOTE — Progress Notes (Signed)
Virtual Visit via video Note  This visit type was conducted due to national recommendations for restrictions regarding the COVID-19 pandemic (e.g. social distancing).  This format is felt to be most appropriate for this patient at this time.  All issues noted in this document were discussed and addressed.  No physical exam was performed (except for noted visual exam findings with Video Visits).   I connected with Cynthia Houston today at 10:30 AM EDT by a video enabled telemedicine application or telephone and verified that I am speaking with the correct person using two identifiers. Location patient: home Location provider: home office Persons participating in the virtual visit: patient, provider  I discussed the limitations, risks, security and privacy concerns of performing an evaluation and management service by telephone and the availability of in person appointments. I also discussed with the patient that there may be a patient responsible charge related to this service. The patient expressed understanding and agreed to proceed.  Reason for visit: same day   HPI: Right ear pain and right frontal sinus pressure: Patient notes sinus issues with some pressure going back to last week though notes on Saturday she developed significant right ear pain.  Improved a little bit on Sunday and Monday though got quite a bit worse today.  She felt as though she was having fluid come out of it.  The fluid was not discolored and there is no blood in it.  She notes some pressure sensation in her right frontal sinus.  She has had no cough, shortness of breath, fever, or taste or smell disturbances.  No COVID-19 exposures.  She is not vaccinated against COVID-19.  She had COVID-19 earlier this year.  In July she had a positive spike protein antibody for COVID-19.  She has tried Tylenol Sinus and Advil which both helped with the pain though once it wore off the discomfort came back.  Occasionally she will take  Zyrtec.   ROS: See pertinent positives and negatives per HPI.  Past Medical History:  Diagnosis Date  . Chicken pox   . Depression   . Diffuse cystic mastopathy 2013  . Family history of adverse reaction to anesthesia    Mother - PONV  . Headache(784.0)   . Hx of dysplastic nevus    multiple sites  . Osteoarthritis    right hand  . PONV (postoperative nausea and vomiting)   . Tendinitis    right elbow  . Urinary tract infection     Past Surgical History:  Procedure Laterality Date  . ABDOMINAL HYSTERECTOMY  2008   supracervical   . Barbie Banner OSTEOTOMY Left 07/16/2019   Procedure: PHALANX OSTEOTOMY AKIN;  Surgeon: Albertine Patricia, DPM;  Location: [facility];  Service: Podiatry;  Laterality: Left;  . BREAST CYST ASPIRATION Right    neg  . BUNIONECTOMY Right 2011  . BUNIONECTOMY Left 07/16/2019   Procedure: LAPIDUS-TYPE;  Surgeon: Albertine Patricia, DPM;  Location: [facility];  Service: Podiatry;  Laterality: Left;  LMA WITH BLOCK  . COLONOSCOPY  2008   Dr. Vira Agar  . DIAGNOSTIC LAPAROSCOPY    . ECTOPIC PREGNANCY SURGERY  1989  . gynecological surgery   2004  . TUBAL LIGATION    . UTERINE FIBROID SURGERY  2002   x2 2004  . WEIL OSTEOTOMY Left 07/16/2019   Procedure: WEIL OSTEOTOMY;  Surgeon: Albertine Patricia, DPM;  Location: [facility];  Service: Podiatry;  Laterality: Left;    Family History  Problem Relation Age of  Onset  . Hyperlipidemia Mother   . Hypertension Mother   . Arthritis Mother        rheumatoid arthritis  . Crohn's disease Father   . Heart disease Father 22       CABG  . Hyperlipidemia Father   . Hypertension Father   . Breast cancer Neg Hx     SOCIAL HX: Non-smoker   Current Outpatient Medications:  .  BIOTIN PO, Take by mouth daily., Disp: , Rfl:  .  Cholecalciferol (VITAMIN D) 50 MCG (2000 UT) tablet, Take 2,000 Units by mouth daily., Disp: , Rfl:  .  Multiple Vitamin (MULTIVITAMIN) tablet, Take 1 tablet by mouth  daily., Disp: , Rfl:  .  propranolol (INDERAL) 10 MG tablet, Take 1 tablet (10 mg total) by mouth 3 (three) times daily. As needed for rapid heart rate, Disp: 30 tablet, Rfl: 0 .  sertraline (ZOLOFT) 50 MG tablet, Take 1 tablet (50 mg total) by mouth daily., Disp: 90 tablet, Rfl: 1 .  cefdinir (OMNICEF) 300 MG capsule, Take 1 capsule (300 mg total) by mouth 2 (two) times daily., Disp: 20 capsule, Rfl: 0  EXAM:  VITALS per patient if applicable:  GENERAL: alert, oriented, appears well and in no acute distress  HEENT: atraumatic, conjunttiva clear, no obvious abnormalities on inspection of external nose and ears  NECK: normal movements of the head and neck  LUNGS: on inspection no signs of respiratory distress, breathing rate appears normal, no obvious gross SOB, gasping or wheezing  CV: no obvious cyanosis  MS: moves all visible extremities without noticeable abnormality  PSYCH/NEURO: pleasant and cooperative, no obvious depression or anxiety, speech and thought processing grossly intact  ASSESSMENT AND PLAN:  Discussed the following assessment and plan:  Right ear pain Concern for otitis media given her description of significant ear pain with drainage.  Also discussed the potential for COVID-19 contributing to her sinus issues.  I encouraged her to get tested for COVID-19 and to quarantine until she gets a negative test result.  Discussed empiric treatment for otitis media given inability to complete an exam.  We will treat with Omnicef given patient's antibiotic allergies.  She will go through the drive-through to pick this up.  Discussed that she should be noticing some improvement within 2 to 3 days and if not she will let us know.  Advised to start on Zyrtec as well to see if that would help with any sinus or eustachian tube inflammation.  I also encouraged COVID-19 vaccination.   No orders of the defined types were placed in this encounter.   Meds ordered this encounter    Medications  . cefdinir (OMNICEF) 300 MG capsule    Sig: Take 1 capsule (300 mg total) by mouth 2 (two) times daily.    Dispense:  20 capsule    Refill:  0     I discussed the assessment and treatment plan with the patient. The patient was provided an opportunity to ask questions and all were answered. The patient agreed with the plan and demonstrated an understanding of the instructions.   The patient was advised to call back or seek an in-person evaluation if the symptoms worsen or if the condition fails to improve as anticipated.   Tommi Rumps, MD

## 2020-03-29 NOTE — Assessment & Plan Note (Signed)
Concern for otitis media given her description of significant ear pain with drainage.  Also discussed the potential for COVID-19 contributing to her sinus issues.  I encouraged her to get tested for COVID-19 and to quarantine until she gets a negative test result.  Discussed empiric treatment for otitis media given inability to complete an exam.  We will treat with Omnicef given patient's antibiotic allergies.  She will go through the drive-through to pick this up.  Discussed that she should be noticing some improvement within 2 to 3 days and if not she will let us know.  Advised to start on Zyrtec as well to see if that would help with any sinus or eustachian tube inflammation.  I also encouraged COVID-19 vaccination.

## 2020-04-04 ENCOUNTER — Ambulatory Visit
Admission: RE | Admit: 2020-04-04 | Discharge: 2020-04-04 | Disposition: A | Discharge status: Home / Self Care | Payer: Managed Care, Other (non HMO) | Source: Ambulatory Visit | Attending: Internal Medicine | Admitting: Internal Medicine

## 2020-04-04 ENCOUNTER — Other Ambulatory Visit: Payer: Self-pay

## 2020-04-04 DIAGNOSIS — Z1231 Encounter for screening mammogram for malignant neoplasm of breast: Secondary | ICD-10-CM | POA: Diagnosis present

## 2020-04-06 DIAGNOSIS — Z1211 Encounter for screening for malignant neoplasm of colon: Secondary | ICD-10-CM

## 2020-04-06 DIAGNOSIS — Z8371 Family history of colonic polyps: Secondary | ICD-10-CM

## 2020-04-25 ENCOUNTER — Telehealth (INDEPENDENT_AMBULATORY_CARE_PROVIDER_SITE_OTHER): Payer: Self-pay | Admitting: Gastroenterology

## 2020-04-25 ENCOUNTER — Other Ambulatory Visit: Payer: Self-pay

## 2020-04-25 DIAGNOSIS — Z8371 Family history of colonic polyps: Secondary | ICD-10-CM

## 2020-04-25 DIAGNOSIS — Z1211 Encounter for screening for malignant neoplasm of colon: Secondary | ICD-10-CM

## 2020-04-25 MED ORDER — NA SULFATE-K SULFATE-MG SULF 17.5-3.13-1.6 GM/177ML PO SOLN: 1.0000 | Freq: Once | ORAL | 0 refills | Status: AC

## 2020-04-25 NOTE — Progress Notes (Signed)
Gastroenterology Pre-Procedure Review  Request Date: Tuesday 05/10/20 Requesting Physician: Dr. Allen Norris  PATIENT REVIEW QUESTIONS: The patient responded to the following health history questions as indicated:    1. Are you having any GI issues? Occasional constipation 2. Do you have a personal history of Polyps? no 3. Do you have a family history of Colon Cancer or Polyps? yes (Father colon polyps, crohns and ulcerative colon had to have part of his intestines removed.) 4. Diabetes Mellitus? no 5. Joint replacements in the past 12 months?noNo Joints replaced however patient had bunionectomy in January 2021 6. Major health problems in the past 3 months?no 7. Any artificial heart valves, MVP, or defibrillator?no    MEDICATIONS & ALLERGIES:    Patient reports the following regarding taking any anticoagulation/antiplatelet therapy:   Plavix, Coumadin, Eliquis, Xarelto, Lovenox, Pradaxa, Brilinta, or Effient? no Aspirin? no  Patient confirms/reports the following medications:  Current Outpatient Medications  Medication Sig Dispense Refill  . BIOTIN PO Take by mouth daily.    . Cholecalciferol (VITAMIN D) 50 MCG (2000 UT) tablet Take 2,000 Units by mouth daily.    . Multiple Vitamin (MULTIVITAMIN) tablet Take 1 tablet by mouth daily.    . sertraline (ZOLOFT) 50 MG tablet Take 1 tablet (50 mg total) by mouth daily. 90 tablet 1  . cefdinir (OMNICEF) 300 MG capsule Take 1 capsule (300 mg total) by mouth 2 (two) times daily. (Patient not taking: Reported on 04/25/2020) 20 capsule 0  . Na Sulfate-K Sulfate-Mg Sulf 17.5-3.13-1.6 GM/177ML SOLN Take 1 kit by mouth once for 1 dose. 354 mL 0  . propranolol (INDERAL) 10 MG tablet Take 1 tablet (10 mg total) by mouth 3 (three) times daily. As needed for rapid heart rate (Patient not taking: Reported on 04/25/2020) 30 tablet 0   No current facility-administered medications for this visit.    Patient confirms/reports the following allergies:  Allergies   Allergen Reactions  . Sulfa Antibiotics Rash       . Fiorinal [Butalbital-Aspirin-Caffeine] Nausea And Vomiting    Also drop in BP  . Percocet [Oxycodone-Acetaminophen] Nausea And Vomiting    Also drop in BP  . Augmentin [Amoxicillin-Pot Clavulanate] Rash  . Doxycycline Rash    No orders of the defined types were placed in this encounter.   AUTHORIZATION INFORMATION Primary Insurance: 1D#: Group #:  Secondary Insurance: 1D#: Group #:  SCHEDULE INFORMATION: Date: Tuesday 05/10/20 Time: Location:[facility]

## 2020-04-27 ENCOUNTER — Other Ambulatory Visit: Payer: Self-pay

## 2020-04-27 ENCOUNTER — Ambulatory Visit: Payer: Managed Care, Other (non HMO) | Admitting: Podiatry

## 2020-04-27 ENCOUNTER — Encounter: Payer: Self-pay | Admitting: Podiatry

## 2020-04-27 ENCOUNTER — Ambulatory Visit (INDEPENDENT_AMBULATORY_CARE_PROVIDER_SITE_OTHER): Payer: Managed Care, Other (non HMO)

## 2020-04-27 VITALS — BP 117/68 | HR 68 | Temp 98.6°F

## 2020-04-27 DIAGNOSIS — M21619 Bunion of unspecified foot: Secondary | ICD-10-CM | POA: Diagnosis not present

## 2020-04-27 DIAGNOSIS — M21612 Bunion of left foot: Secondary | ICD-10-CM | POA: Diagnosis not present

## 2020-04-27 NOTE — Progress Notes (Signed)
Subjective:   Patient ID: Cynthia Houston, female   DOB: 54 y.o.   MRN: [id number]   HPI Patient presents concerned about bunion correction that was done about 10 months on her left foot as to why she is still having soreness.  Patient had this performed by another physician and I had done the bunion on her other foot years ago.  Patient does not smoke likes to be   Review of Systems  All other systems reviewed and are negative.       Objective:  Physical Exam Vitals and nursing note reviewed.  Constitutional:      Appearance: She is well-developed.  Pulmonary:     Effort: Pulmonary effort is normal.  Musculoskeletal:        General: Normal range of motion.  Skin:    General: Skin is warm.  Neurological:     Mental Status: She is alert.     Neurovascular status intact muscle strength adequate range of motion within normal limits.  Patient is found to have well-healed surgical site left first metatarsal and also left second metatarsal and some inflammation of the lateral side of the foot and mild inflammation around the big toe joint left foot with good range of motion and no crepitus.     Assessment:  Probability that we are still in a healing phase associated with this even though I cannot rule out a nonunion or other bone structural pathology with probable compensation     Plan:  H&P x-rays done and today I went ahead and discussed that this may take another 6 months to heal completely but I am satisfied with plate positioning and other things that have been done.  Patient will be seen back if symptoms indicate

## 2020-05-03 ENCOUNTER — Telehealth: Payer: Self-pay | Admitting: Gastroenterology

## 2020-05-03 ENCOUNTER — Telehealth: Payer: Self-pay

## 2020-05-03 NOTE — Telephone Encounter (Signed)
Returned call to R/S colonoscopy with Dr. Allen Norris.  LVM for patient to call back.  Thanks,  [place], [place]

## 2020-05-03 NOTE — Telephone Encounter (Signed)
Returned patients phone call requesting to reschedule 05/10/20 colonoscopy with Dr. Allen Norris.  LVM for her to call the office back to reschedule.  Thanks,  [place], [place]

## 2020-05-04 ENCOUNTER — Other Ambulatory Visit: Payer: Self-pay | Admitting: Internal Medicine

## 2020-05-04 ENCOUNTER — Other Ambulatory Visit: Payer: Self-pay

## 2020-05-04 DIAGNOSIS — Z8371 Family history of colonic polyps: Secondary | ICD-10-CM

## 2020-05-04 DIAGNOSIS — Z1211 Encounter for screening for malignant neoplasm of colon: Secondary | ICD-10-CM

## 2020-05-04 MED ORDER — NA SULFATE-K SULFATE-MG SULF 17.5-3.13-1.6 GM/177ML PO SOLN
1.0000 | Freq: Once | ORAL | 0 refills | Status: AC
Start: 2020-05-04 — End: 2020-05-04

## 2020-05-09 IMAGING — MG MM DIGITAL SCREENING BILAT W/ TOMO W/ CAD
8 series · 9 of 24 positions shown · non-contrast
Comparison: Previous exam(s).

CLINICAL DATA: Screening.

EXAM:
DIGITAL SCREENING BILATERAL MAMMOGRAM WITH TOMO AND CAD

[R CC synth-2D]
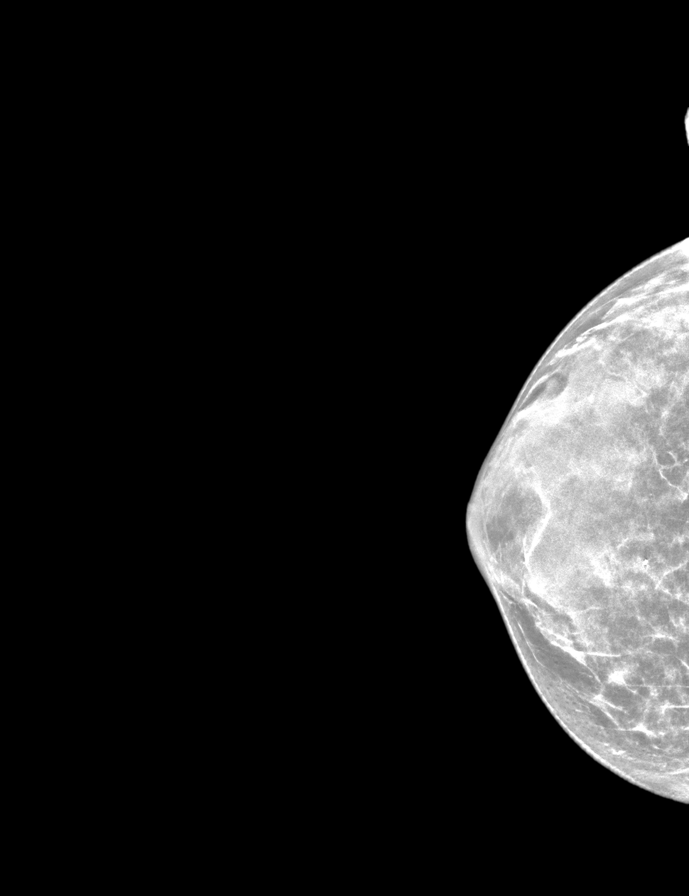

[R MLO synth-2D]
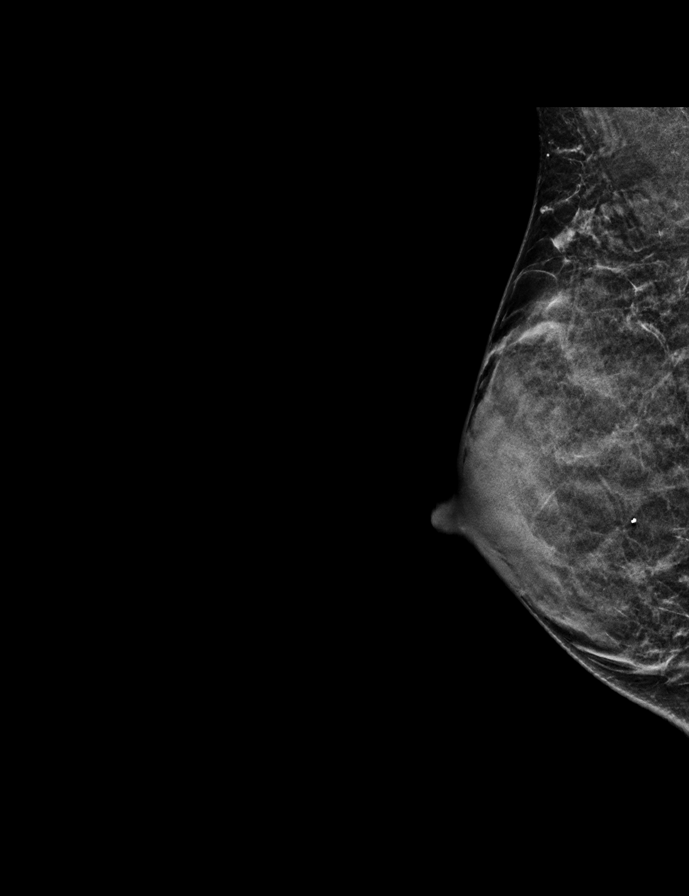

[L MLO synth-2D]
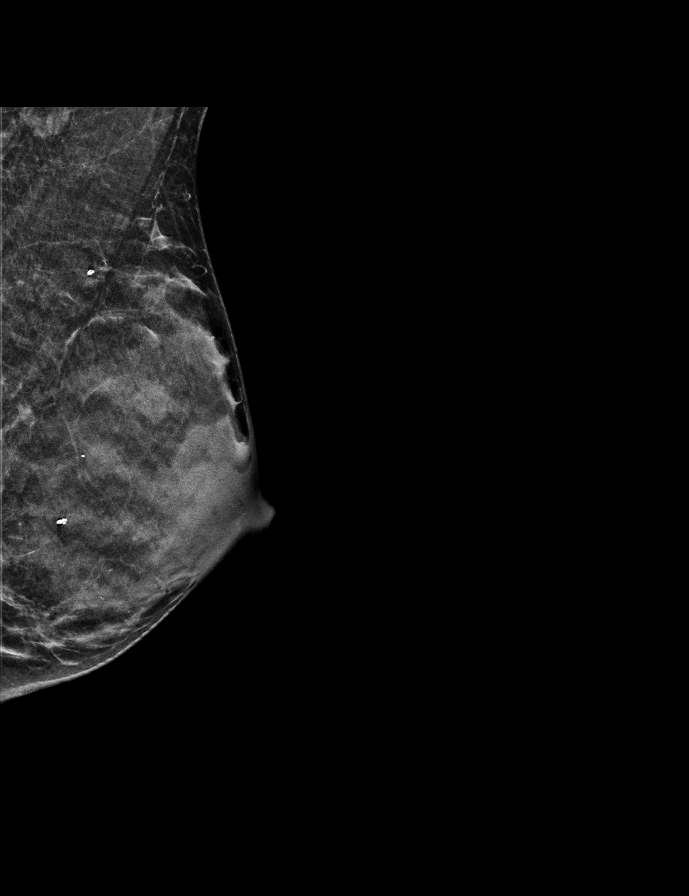

[L CC synth-2D]
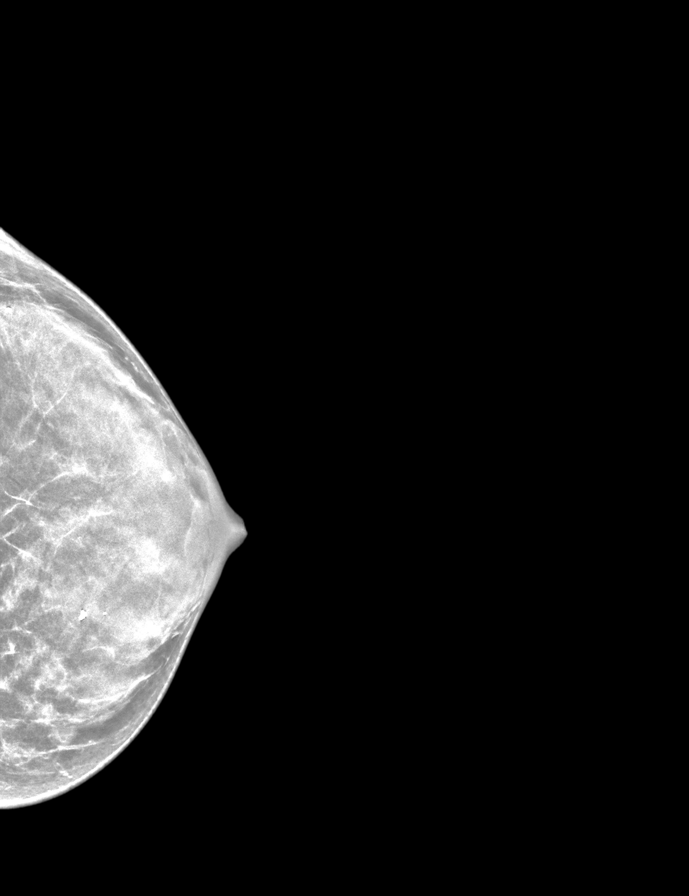

[R CC tomo · 2 of 44 frames shown]
[frame 15/44]
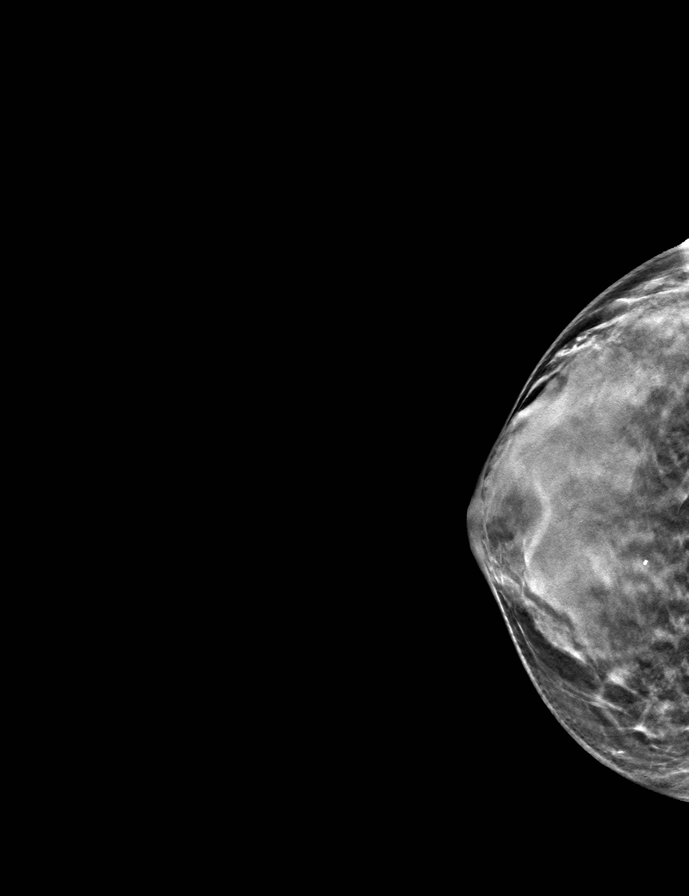
[frame 23/44]
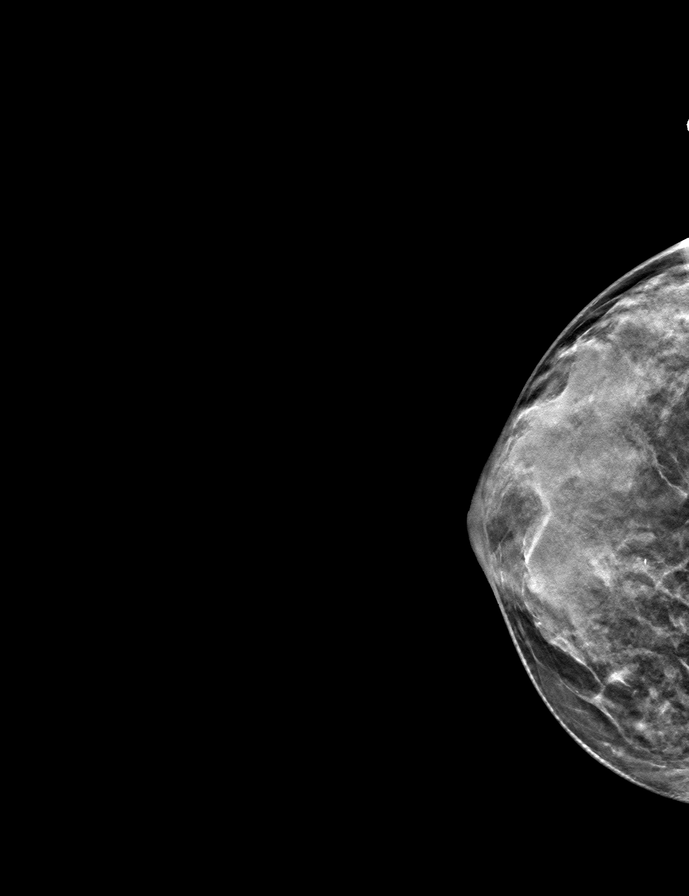

[R MLO tomo · tomo slice 16/31.0]
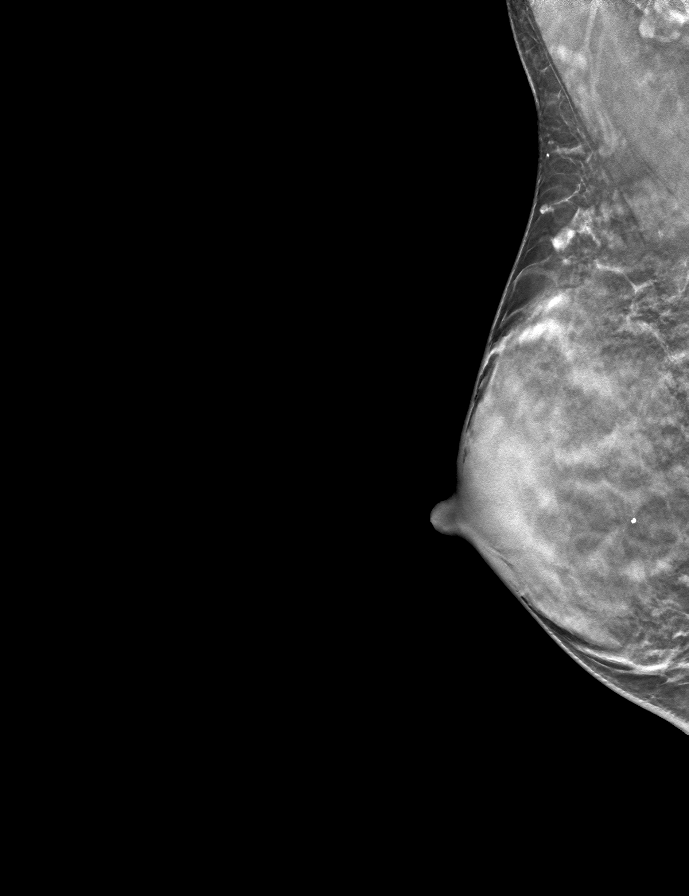

[L MLO tomo · tomo slice 19/36.0]
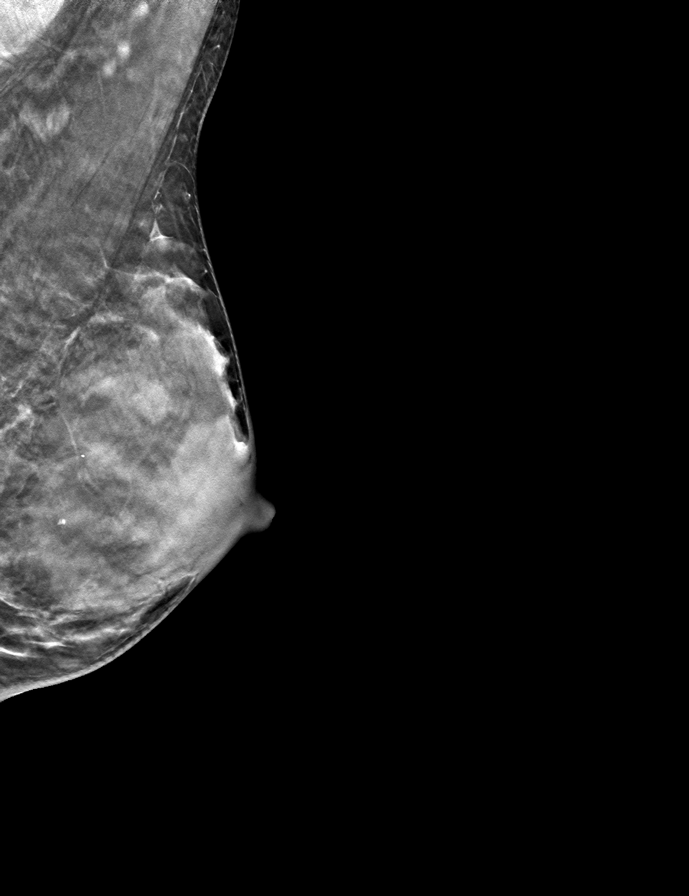

[L CC tomo · tomo slice 21/41.0]
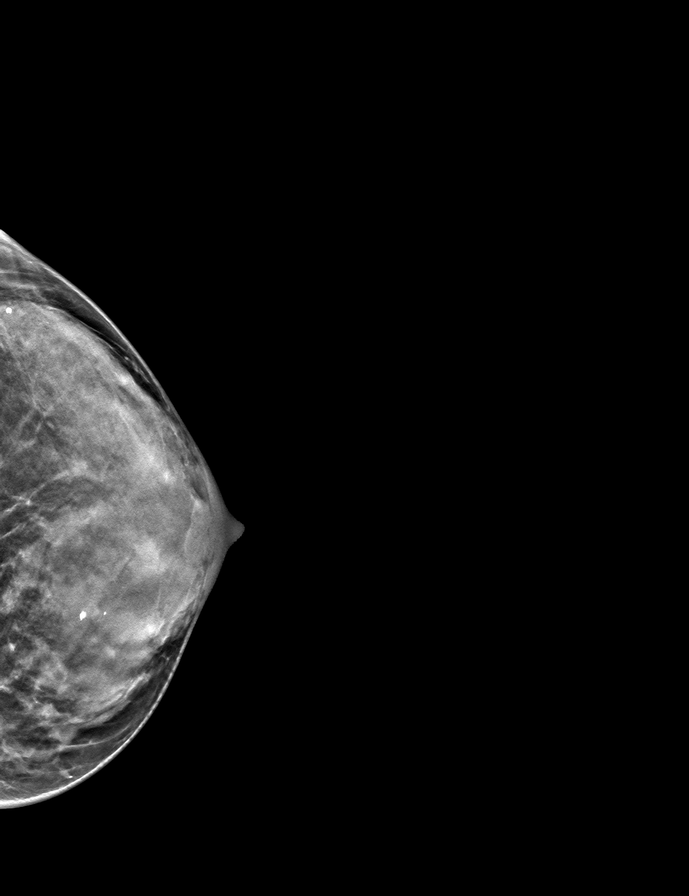

[9 of 24 positions shown; findings below may reference images not displayed]

ACR Breast Density Category d: The breast tissue is extremely dense,
which lowers the sensitivity of mammography.
FINDINGS: There are no findings suspicious for malignancy. Images were
processed with CAD.
IMPRESSION: No mammographic evidence of malignancy. A result letter of this
screening mammogram will be mailed directly to the patient.

RECOMMENDATION:
Screening mammogram in one year. (Code:RA-I-AVB)

BI-RADS CATEGORY  1: Negative.

## 2020-05-20 ENCOUNTER — Other Ambulatory Visit
Admission: RE | Admit: 2020-05-20 | Discharge: 2020-05-20 | Disposition: A | Discharge status: Home / Self Care | Payer: Managed Care, Other (non HMO) | Source: Ambulatory Visit | Attending: Gastroenterology | Admitting: Gastroenterology

## 2020-05-20 ENCOUNTER — Other Ambulatory Visit: Payer: Self-pay

## 2020-05-20 DIAGNOSIS — Z20822 Contact with and (suspected) exposure to covid-19: Secondary | ICD-10-CM | POA: Insufficient documentation

## 2020-05-20 DIAGNOSIS — Z01812 Encounter for preprocedural laboratory examination: Secondary | ICD-10-CM | POA: Diagnosis not present

## 2020-05-21 LAB — SARS CORONAVIRUS 2 (TAT 6-24 HRS): SARS Coronavirus 2: NEGATIVE

## 2020-05-24 ENCOUNTER — Other Ambulatory Visit: Payer: Self-pay

## 2020-05-24 ENCOUNTER — Encounter
Admission: RE | Disposition: A | Discharge status: Home / Self Care | Payer: Self-pay | Source: Home / Self Care | Attending: Gastroenterology

## 2020-05-24 ENCOUNTER — Ambulatory Visit: Payer: Managed Care, Other (non HMO) | Admitting: Certified Registered"

## 2020-05-24 ENCOUNTER — Encounter: Payer: Self-pay | Admitting: Gastroenterology

## 2020-05-24 ENCOUNTER — Ambulatory Visit
Admission: RE | Admit: 2020-05-24 | Discharge: 2020-05-24 | Disposition: A | Discharge status: Home / Self Care | Payer: Managed Care, Other (non HMO) | Attending: Gastroenterology | Admitting: Gastroenterology

## 2020-05-24 DIAGNOSIS — K640 First degree hemorrhoids: Secondary | ICD-10-CM | POA: Diagnosis not present

## 2020-05-24 DIAGNOSIS — Z885 Allergy status to narcotic agent status: Secondary | ICD-10-CM | POA: Diagnosis not present

## 2020-05-24 DIAGNOSIS — Z83719 Family history of colon polyps, unspecified: Secondary | ICD-10-CM

## 2020-05-24 DIAGNOSIS — Z882 Allergy status to sulfonamides status: Secondary | ICD-10-CM | POA: Insufficient documentation

## 2020-05-24 DIAGNOSIS — Z886 Allergy status to analgesic agent status: Secondary | ICD-10-CM | POA: Insufficient documentation

## 2020-05-24 DIAGNOSIS — Z1211 Encounter for screening for malignant neoplasm of colon: Secondary | ICD-10-CM | POA: Diagnosis not present

## 2020-05-24 DIAGNOSIS — Z8371 Family history of colonic polyps: Secondary | ICD-10-CM | POA: Diagnosis not present

## 2020-05-24 DIAGNOSIS — D120 Benign neoplasm of cecum: Secondary | ICD-10-CM | POA: Insufficient documentation

## 2020-05-24 DIAGNOSIS — Z881 Allergy status to other antibiotic agents status: Secondary | ICD-10-CM | POA: Insufficient documentation

## 2020-05-24 DIAGNOSIS — Z79899 Other long term (current) drug therapy: Secondary | ICD-10-CM | POA: Insufficient documentation

## 2020-05-24 DIAGNOSIS — K635 Polyp of colon: Secondary | ICD-10-CM | POA: Diagnosis not present

## 2020-05-24 HISTORY — PX: COLONOSCOPY WITH PROPOFOL: SHX5780

## 2020-05-24 SURGERY — COLONOSCOPY WITH PROPOFOL
Anesthesia: General

## 2020-05-24 MED ORDER — LIDOCAINE HCL (CARDIAC) PF 100 MG/5ML IV SOSY
PREFILLED_SYRINGE | INTRAVENOUS | Status: DC | PRN
  Administered 2020-05-24: 100 mg via INTRAVENOUS

## 2020-05-24 MED ORDER — SODIUM CHLORIDE 0.9 % IV SOLN: INTRAVENOUS | Status: DC

## 2020-05-24 MED ORDER — PROPOFOL 10 MG/ML IV BOLUS
INTRAVENOUS | Status: DC | PRN
  Administered 2020-05-24: 10 mg via INTRAVENOUS
  Administered 2020-05-24: 55 mg via INTRAVENOUS

## 2020-05-24 MED ORDER — PROPOFOL 500 MG/50ML IV EMUL
INTRAVENOUS | Status: AC
  Filled 2020-05-24: qty 450

## 2020-05-24 MED ORDER — PROPOFOL 500 MG/50ML IV EMUL
INTRAVENOUS | Status: DC | PRN
  Administered 2020-05-24: 165 ug/kg/min via INTRAVENOUS

## 2020-05-24 NOTE — H&P (Signed)
Lucilla Lame, MD [facility] [address], [place], [place] [address] Phone: [phone] Fax : [phone]  Primary Care Physician:  Crecencio Mc, MD Primary Gastroenterologist:  Dr. Allen Norris  Pre-Procedure History & Physical: HPI:  Cynthia Houston is a 54 y.o. female is here for a screening colonoscopy.   Past Medical History:  Diagnosis Date  . Chicken pox   . Depression   . Diffuse cystic mastopathy 2013  . Family history of adverse reaction to anesthesia    Mother - PONV  . Headache(784.0)   . Hx of dysplastic nevus    multiple sites  . Osteoarthritis    right hand  . PONV (postoperative nausea and vomiting)   . Tendinitis    right elbow  . Urinary tract infection     Past Surgical History:  Procedure Laterality Date  . ABDOMINAL HYSTERECTOMY  2008   supracervical   . Barbie Banner OSTEOTOMY Left 07/16/2019   Procedure: PHALANX OSTEOTOMY AKIN;  Surgeon: Albertine Patricia, DPM;  Location: [place];  Service: Podiatry;  Laterality: Left;  . BREAST CYST ASPIRATION Right    neg  . BUNIONECTOMY Right 2011  . BUNIONECTOMY Left 07/16/2019   Procedure: LAPIDUS-TYPE;  Surgeon: Albertine Patricia, DPM;  Location: [place];  Service: Podiatry;  Laterality: Left;  LMA WITH BLOCK  . COLONOSCOPY  2008   Dr. Vira Agar  . DIAGNOSTIC LAPAROSCOPY    . ECTOPIC PREGNANCY SURGERY  1989  . gynecological surgery   2004  . TUBAL LIGATION    . UTERINE FIBROID SURGERY  2002   x2 2004  . WEIL OSTEOTOMY Left 07/16/2019   Procedure: WEIL OSTEOTOMY;  Surgeon: Albertine Patricia, DPM;  Location: [place];  Service: Podiatry;  Laterality: Left;    Prior to Admission medications   Medication Sig Start Date End Date Taking? Authorizing Provider  BIOTIN PO Take by mouth daily.   Yes [provider]  Cholecalciferol (VITAMIN D) 50 MCG (2000 UT) tablet Take 2,000 Units by mouth daily.   Yes [provider]  Multiple Vitamin (MULTIVITAMIN) tablet Take 1  tablet by mouth daily.   Yes [provider]  sertraline (ZOLOFT) 50 MG tablet TAKE ONE TABLET EVERY DAY 05/05/20  Yes Crecencio Mc, MD  cefdinir (OMNICEF) 300 MG capsule Take 1 capsule (300 mg total) by mouth 2 (two) times daily. Patient not taking: Reported on 04/25/2020 03/29/20   Leone Haven, MD  propranolol (INDERAL) 10 MG tablet Take 1 tablet (10 mg total) by mouth 3 (three) times daily. As needed for rapid heart rate Patient not taking: Reported on 04/25/2020 12/21/19   Crecencio Mc, MD    Allergies as of 04/26/2020 - Review Complete 04/25/2020  Allergen Reaction Noted  . Sulfa antibiotics Rash   . Fiorinal [butalbital-aspirin-caffeine] Nausea And Vomiting 07/08/2019  . Percocet [oxycodone-acetaminophen] Nausea And Vomiting 05/05/2012  . Augmentin [amoxicillin-pot clavulanate] Rash 05/05/2012  . Doxycycline Rash 07/19/2015    Family History  Problem Relation Age of Onset  . Hyperlipidemia Mother   . Hypertension Mother   . Arthritis Mother        rheumatoid arthritis  . Crohn's disease Father   . Heart disease Father 29       CABG  . Hyperlipidemia Father   . Hypertension Father   . Breast cancer Neg Hx     Social History   Socioeconomic History  . Marital status: Divorced    Spouse name: Not on file  .  Number of children: Not on file  . Years of education: Not on file  . Highest education level: Not on file  Occupational History  . Not on file  Tobacco Use  . Smoking status: Never Smoker  . Smokeless tobacco: Never Used  Vaping Use  . Vaping Use: Never used  Substance and Sexual Activity  . Alcohol use: Yes    Alcohol/week: 2.0 standard drinks    Types: 2 Glasses of wine per week  . Drug use: No  . Sexual activity: Not on file  Other Topics Concern  . Not on file  Social History Narrative  . Not on file   Social Determinants of Health   Financial Resource Strain:   . Difficulty of Paying Living Expenses: Not on file  Food  Insecurity:   . Worried About Charity fundraiser in the Last Year: Not on file  . Ran Out of Food in the Last Year: Not on file  Transportation Needs:   . Lack of Transportation (Medical): Not on file  . Lack of Transportation (Non-Medical): Not on file  Physical Activity:   . Days of Exercise per Week: Not on file  . Minutes of Exercise per Session: Not on file  Stress:   . Feeling of Stress : Not on file  Social Connections:   . Frequency of Communication with Friends and Family: Not on file  . Frequency of Social Gatherings with Friends and Family: Not on file  . Attends Religious Services: Not on file  . Active Member of Clubs or Organizations: Not on file  . Attends Archivist Meetings: Not on file  . Marital Status: Not on file  Intimate Partner Violence:   . Fear of Current or Ex-Partner: Not on file  . Emotionally Abused: Not on file  . Physically Abused: Not on file  . Sexually Abused: Not on file    Review of Systems: See HPI, otherwise negative ROS  Physical Exam: BP 117/69   Pulse 74   Temp 97.9 F (36.6 C) (Temporal)   Resp 16   Ht 5\' 3"  (1.6 m)   Wt 51.7 kg   SpO2 100%   BMI 20.19 kg/m  General:   Alert,  pleasant and cooperative in NAD Head:  Normocephalic and atraumatic. Neck:  Supple; no masses or thyromegaly. Lungs:  Clear throughout to auscultation.    Heart:  Regular rate and rhythm. Abdomen:  Soft, nontender and nondistended. Normal bowel sounds, without guarding, and without rebound.   Neurologic:  Alert and  oriented x4;  grossly normal neurologically.  Impression/Plan: Cynthia Houston is now here to undergo a screening colonoscopy.  Risks, benefits, and alternatives regarding colonoscopy have been reviewed with the patient.  Questions have been answered.  All parties agreeable.

## 2020-05-24 NOTE — Anesthesia Preprocedure Evaluation (Signed)
Anesthesia Evaluation  Patient identified by MRN, date of birth, ID band Patient awake    Reviewed: Allergy & Precautions, H&P , NPO status , Patient's Chart, lab work & pertinent test results, reviewed documented beta blocker date and time   History of Anesthesia Complications (+) PONV, Family history of anesthesia reaction and history of anesthetic complications  Airway Mallampati: II  TM Distance: >3 FB Neck ROM: full    Dental  (+) Dental Advidsory Given, Teeth Intact, Caps   Pulmonary neg pulmonary ROS,    Pulmonary exam normal breath sounds clear to auscultation       Cardiovascular Exercise Tolerance: Good negative cardio ROS Normal cardiovascular exam Rhythm:regular Rate:Normal     Neuro/Psych  Headaches, neg Seizures PSYCHIATRIC DISORDERS Anxiety Depression    GI/Hepatic negative GI ROS, Neg liver ROS,   Endo/Other  negative endocrine ROS  Renal/GU negative Renal ROS  negative genitourinary   Musculoskeletal   Abdominal   Peds  Hematology negative hematology ROS (+)   Anesthesia Other Findings Past Medical History: No date: Chicken pox No date: Depression 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 No date: Family history of adverse reaction to anesthesia     Comment:  Mother - PONV No date: Headache(784.0) No date: Hx of dysplastic nevus     Comment:  multiple sites No date: Osteoarthritis     Comment:  right hand No date: PONV (postoperative nausea and vomiting) No date: Tendinitis     Comment:  right elbow No date: Urinary tract infection   Reproductive/Obstetrics negative OB ROS                             Anesthesia Physical Anesthesia Plan  ASA: II  Anesthesia Plan: General   Post-op Pain Management:    Induction: Intravenous  PONV Risk Score and Plan: 4 or greater and Propofol infusion and TIVA  Airway Management Planned: Natural Airway and Nasal  Cannula  Additional Equipment:   Intra-op Plan:   Post-operative Plan:   Informed Consent: I have reviewed the patients History and Physical, chart, labs and discussed the procedure including the risks, benefits and alternatives for the proposed anesthesia with the patient or authorized representative who has indicated his/her understanding and acceptance.     Dental Advisory Given  Plan Discussed with: Anesthesiologist, CRNA and Surgeon  Anesthesia Plan Comments:         Anesthesia Quick Evaluation

## 2020-05-24 NOTE — Progress Notes (Signed)
   05/24/20 0754  Clinical Encounter Type  Visited With Family  Visit Type Initial  Referral From Chaplain  Consult/Referral To Chaplain  While rounding SDS waiting area, chaplain briefly visited with Pt's significant other. Chaplain asked if he had any questions or concerns and he said no, he was fine.

## 2020-05-24 NOTE — Transfer of Care (Signed)
Immediate Anesthesia Transfer of Care Note  Patient: Cynthia Houston  Procedure(s) Performed: COLONOSCOPY WITH PROPOFOL (N/A )  Patient Location: Endoscopy Unit  Anesthesia Type:General  Level of Consciousness: drowsy and responds to stimulation  Airway & Oxygen Therapy: Patient Spontanous Breathing and Patient connected to face mask oxygen  Post-op Assessment: Report given to RN and Post -op Vital signs reviewed and stable  Post vital signs: Reviewed and stable  Last Vitals:  Vitals Value Taken Time  BP 95/60 05/24/20 0819  Temp    Pulse 62 05/24/20 0820  Resp 15 05/24/20 0820  SpO2 97 % 05/24/20 0820  Vitals shown include unvalidated device data.  Last Pain:  Vitals:   05/24/20 0810  TempSrc: Temporal  PainSc:          Complications: No complications documented.

## 2020-05-24 NOTE — Anesthesia Postprocedure Evaluation (Signed)
Anesthesia Post Note  Patient: Cynthia Houston  Procedure(s) Performed: COLONOSCOPY WITH PROPOFOL (N/A )  Patient location during evaluation: Endoscopy Anesthesia Type: General Level of consciousness: awake and alert Pain management: pain level controlled Vital Signs Assessment: post-procedure vital signs reviewed and stable Respiratory status: spontaneous breathing, nonlabored ventilation, respiratory function stable and patient connected to nasal cannula oxygen Cardiovascular status: blood pressure returned to baseline and stable Postop Assessment: no apparent nausea or vomiting Anesthetic complications: no   No complications documented.   Last Vitals:  Vitals:   05/24/20 0830 05/24/20 0840  BP: (!) 96/56 (!) 99/58  Pulse: (!) 56 (!) 55  Resp: 18 16  Temp:    SpO2: 100% 100%    Last Pain:  Vitals:   05/24/20 0810  TempSrc: Temporal  PainSc:                  Martha Clan

## 2020-05-24 NOTE — Op Note (Signed)
[facility] Patient Name: Cynthia Houston Procedure Date: 05/24/2020 7:37 AM MRN: [id number] Account #: [id number] Date of Birth: Feb 23, 1966 Admit Type: Outpatient Age: 54 Room: [facility] ENDO ROOM 4 Gender: Female Note Status: Finalized Procedure:             Colonoscopy Indications:           Screening for colorectal malignant neoplasm Providers:             Lucilla Lame MD, MD Referring MD:          Deborra Medina, MD (Referring MD) Medicines:             Propofol per Anesthesia Complications:         No immediate complications. Procedure:             Pre-Anesthesia Assessment:                        - Prior to the procedure, a History and Physical was                         performed, and patient medications and allergies were                         reviewed. The patient's tolerance of previous                         anesthesia was also reviewed. The risks and benefits                         of the procedure and the sedation options and risks                         were discussed with the patient. All questions were                         answered, and informed consent was obtained. Prior                         Anticoagulants: The patient has taken no previous                         anticoagulant or antiplatelet agents. ASA Grade                         Assessment: II - A patient with mild systemic disease.                         After reviewing the risks and benefits, the patient                         was deemed in satisfactory condition to undergo the                         procedure.                        After obtaining informed consent, the colonoscope was  passed under direct vision. Throughout the procedure,                         the patient's blood pressure, pulse, and oxygen                         saturations were monitored continuously. The                         Colonoscope was introduced through the  anus and                         advanced to the the cecum, identified by appendiceal                         orifice and ileocecal valve. The colonoscopy was                         performed without difficulty. The patient tolerated                         the procedure well. The quality of the bowel                         preparation was excellent. Findings:      The perianal and digital rectal examinations were normal.      A 3 mm polyp was found in the cecum. The polyp was sessile. The polyp       was removed with a cold biopsy forceps. Resection and retrieval were       complete.      Non-bleeding internal hemorrhoids were found during retroflexion. The       hemorrhoids were Grade I (internal hemorrhoids that do not prolapse). Impression:            - One 3 mm polyp in the cecum, removed with a cold                         biopsy forceps. Resected and retrieved.                        - Non-bleeding internal hemorrhoids. Recommendation:        - Discharge patient to home.                        - Resume previous diet.                        - Continue present medications.                        - Await pathology results.                        - Repeat colonoscopy in 5 years for surveillance. Procedure Code(s):     --- Professional ---                        [phone], Colonoscopy, flexible; with biopsy, single or  multiple Diagnosis Code(s):     --- Professional ---                        Z12.11, Encounter for screening for malignant neoplasm                         of colon                        K63.5, Polyp of colon CPT copyright 2019 American Medical Association. All rights reserved. The codes documented in this report are preliminary and upon coder review may  be revised to meet current compliance requirements. Lucilla Lame MD, MD 05/24/2020 8:17:27 AM This report has been signed electronically. Number of Addenda: 0 Note Initiated On: 05/24/2020 7:37  AM Scope Withdrawal Time: 0 hours 9 minutes 11 seconds  Total Procedure Duration: 0 hours 16 minutes 44 seconds  Estimated Blood Loss:  Estimated blood loss: none.      [facility]

## 2020-05-24 NOTE — Anesthesia Procedure Notes (Signed)
Procedure Name: General with mask airway Performed by: Fletcher-Harrison, Bentzion Dauria, CRNA Pre-anesthesia Checklist: Patient identified, Emergency Drugs available, Suction available and Patient being monitored Patient Re-evaluated:Patient Re-evaluated prior to induction Oxygen Delivery Method: Simple face mask Induction Type: IV induction Placement Confirmation: positive ETCO2 and CO2 detector Dental Injury: Teeth and Oropharynx as per pre-operative assessment        

## 2020-05-25 ENCOUNTER — Encounter: Payer: Self-pay | Admitting: Gastroenterology

## 2020-05-25 LAB — SURGICAL PATHOLOGY

## 2020-05-27 ENCOUNTER — Encounter: Payer: Self-pay | Admitting: Gastroenterology

## 2020-05-30 ENCOUNTER — Encounter: Payer: Managed Care, Other (non HMO) | Admitting: Dermatology

## 2020-06-08 NOTE — Telephone Encounter (Signed)
open encounter in error 

## 2020-07-20 ENCOUNTER — Encounter: Payer: Managed Care, Other (non HMO) | Admitting: Dermatology

## 2020-10-25 ENCOUNTER — Other Ambulatory Visit: Payer: Self-pay

## 2020-10-25 ENCOUNTER — Ambulatory Visit: Payer: Managed Care, Other (non HMO) | Admitting: Dermatology

## 2020-10-25 DIAGNOSIS — L814 Other melanin hyperpigmentation: Secondary | ICD-10-CM

## 2020-10-25 DIAGNOSIS — Z1283 Encounter for screening for malignant neoplasm of skin: Secondary | ICD-10-CM

## 2020-10-25 DIAGNOSIS — L821 Other seborrheic keratosis: Secondary | ICD-10-CM

## 2020-10-25 DIAGNOSIS — Z808 Family history of malignant neoplasm of other organs or systems: Secondary | ICD-10-CM

## 2020-10-25 DIAGNOSIS — D2362 Other benign neoplasm of skin of left upper limb, including shoulder: Secondary | ICD-10-CM

## 2020-10-25 DIAGNOSIS — I781 Nevus, non-neoplastic: Secondary | ICD-10-CM

## 2020-10-25 DIAGNOSIS — D229 Melanocytic nevi, unspecified: Secondary | ICD-10-CM

## 2020-10-25 DIAGNOSIS — D1800 Hemangioma unspecified site: Secondary | ICD-10-CM

## 2020-10-25 DIAGNOSIS — L578 Other skin changes due to chronic exposure to nonionizing radiation: Secondary | ICD-10-CM

## 2020-10-25 DIAGNOSIS — Z86018 Personal history of other benign neoplasm: Secondary | ICD-10-CM | POA: Diagnosis not present

## 2020-10-25 DIAGNOSIS — L738 Other specified follicular disorders: Secondary | ICD-10-CM

## 2020-10-25 NOTE — Progress Notes (Signed)
   Follow-Up Visit   Subjective  Cynthia Houston is a 55 y.o. female who presents for the following: year follow up (Patient here today for tbse. Patient states she has a spot on side of right breast she would like checked. She reports no new concerns. Patient reports a family history of skin cancer in her father. ).  Patient here for full body skin exam and skin cancer screening.  The following portions of the chart were reviewed this encounter and updated as appropriate:       Objective  Well appearing patient in no apparent distress; mood and affect are within normal limits.  A full examination was performed including scalp, head, eyes, ears, nose, lips, neck, chest, axillae, abdomen, back, buttocks, bilateral upper extremities, bilateral lower extremities, hands, feet, fingers, toes, fingernails, and toenails. All findings within normal limits unless otherwise noted below.  Objective  right lateral breast: 8 x 6 mm waxy pink tan papule- stable from previous skin check  Assessment & Plan  Seborrheic keratosis right lateral breast  - Benign-appearing - Discussed benign etiology and prognosis. - Observe - Call for any changes  Reassured benign age-related growth.  Recommend observation.  Discussed cryotherapy if spot(s) become irritated or inflamed.         Lentigines - Scattered tan macules - Due to sun exposure - Benign-appering, observe - Recommend daily broad spectrum sunscreen SPF 30+ to sun-exposed areas, reapply every 2 hours as needed. - Call for any changes  Telangiectasia - Dilated blood vessel left lower back - Benign appearing on exam - Call for changes  Sebaceous Hyperplasia - Small yellow papules with a central dell on face  - Benign - Observe  Dermatofibroma - 5 mm Firm pink/brown papulenodule with dimple sign left elbow  - Benign appearing - Call for any changes  Seborrheic Keratoses - Stuck-on, waxy, tan-brown papules and/or plaques at  left infra occular  - discussed removal options  - Benign-appearing - Discussed benign etiology and prognosis. - Observe - Call for any changes  Melanocytic Nevi - Tan-brown and/or pink-flesh-colored symmetric macules and papules - Benign appearing on exam today - Observation - Call clinic for new or changing moles - Recommend daily use of broad spectrum spf 30+ sunscreen to sun-exposed areas.   Hemangiomas - Red papules - Discussed benign nature - Observe - Call for any changes  Actinic Damage - Chronic condition, secondary to cumulative UV/sun exposure - diffuse scaly erythematous macules with underlying dyspigmentation - Recommend daily broad spectrum sunscreen SPF 30+ to sun-exposed areas, reapply every 2 hours as needed.  - Staying in the shade or wearing long sleeves, sun glasses (UVA+UVB protection) and wide brim hats (4-inch brim around the entire circumference of the hat) are also recommended for sun protection.  - Call for new or changing lesions.  History of Dysplastic Nevi - No evidence of recurrence today on right lateral thigh - Recommend regular full body skin exams - Recommend daily broad spectrum sunscreen SPF 30+ to sun-exposed areas, reapply every 2 hours as needed.  - Call if any new or changing lesions are noted between office visits   Skin cancer screening performed today.  Return in about 1 year (around 10/25/2021) for tbse.  I, Ruthell Rummage, CMA, am acting as scribe for Brendolyn Patty, MD.  Documentation: I have reviewed the above documentation for accuracy and completeness, and I agree with the above.  Brendolyn Patty MD

## 2020-10-25 NOTE — Patient Instructions (Addendum)
Melanoma ABCDEs  Melanoma is the most dangerous type of skin cancer, and is the leading cause of death from skin disease.  You are more likely to develop melanoma if you: Have light-colored skin, light-colored eyes, or red or blond hair Spend a lot of time in the sun Tan regularly, either outdoors or in a tanning bed Have had blistering sunburns, especially during childhood Have a close family member who has had a melanoma Have atypical moles or large birthmarks  Early detection of melanoma is key since treatment is typically straightforward and cure rates are extremely high if we catch it early.   The first sign of melanoma is often a change in a mole or a new dark spot.  The ABCDE system is a way of remembering the signs of melanoma.  A for asymmetry:  The two halves do not match. B for border:  The edges of the growth are irregular. C for color:  A mixture of colors are present instead of an even brown color. D for diameter:  Melanomas are usually (but not always) greater than 6mm - the size of a pencil eraser. E for evolution:  The spot keeps changing in size, shape, and color.  Please check your skin once per month between visits. You can use a small mirror in front and a large mirror behind you to keep an eye on the back side or your body.   If you see any new or changing lesions before your next follow-up, please call to schedule a visit.  Please continue daily skin protection including broad spectrum sunscreen SPF 30+ to sun-exposed areas, reapplying every 2 hours as needed when you're outdoors.   Staying in the shade or wearing long sleeves, sun glasses (UVA+UVB protection) and wide brim hats (4-inch brim around the entire circumference of the hat) are also recommended for sun protection.     Seborrheic Keratosis  What causes seborrheic keratoses? Seborrheic keratoses are harmless, common skin growths that first appear during adult life.  As time goes by, more growths appear.   Some people may develop a large number of them.  Seborrheic keratoses appear on both covered and uncovered body parts.  They are not caused by sunlight.  The tendency to develop seborrheic keratoses can be inherited.  They vary in color from skin-colored to gray, brown, or even black.  They can be either smooth or have a rough, warty surface.   Seborrheic keratoses are superficial and look as if they were stuck on the skin.  Under the microscope this type of keratosis looks like layers upon layers of skin.  That is why at times the top layer may seem to fall off, but the rest of the growth remains and re-grows.    Treatment Seborrheic keratoses do not need to be treated, but can easily be removed in the office.  Seborrheic keratoses often cause symptoms when they rub on clothing or jewelry.  Lesions can be in the way of shaving.  If they become inflamed, they can cause itching, soreness, or burning.  Removal of a seborrheic keratosis can be accomplished by freezing, burning, or surgery. If any spot bleeds, scabs, or grows rapidly, please return to have it checked, as these can be an indication of a skin cancer. If you have any questions or concerns for your doctor, please call our main line at [phone] and press option 4 to reach your doctor's medical assistant. If no one answers, please leave a voicemail as directed and   we will return your call as soon as possible. Messages left after 4 pm will be answered the following business day.   You may also send us a message via MyChart. We typically respond to MyChart messages within 1-2 business days.  For prescription refills, please ask your pharmacy to contact our office. Our fax number is [phone].  If you have an urgent issue when the clinic is closed that cannot wait until the next business day, you can page your doctor at the number below.    Please note that while we do our best to be available for urgent issues outside of office hours, we  are not available 24/7.   If you have an urgent issue and are unable to reach us, you may choose to seek medical care at your doctor's office, retail clinic, urgent care center, or emergency room.  If you have a medical emergency, please immediately call 911 or go to the emergency department.  Pager Numbers  - Dr. Kowalski: [phone]  - Dr. Moye: [phone]  - Dr. Stewart: [phone]  In the event of inclement weather, please call our main line at [phone] for an update on the status of any delays or closures.  Dermatology Medication Tips: Please keep the boxes that topical medications come in in order to help keep track of the instructions about where and how to use these. Pharmacies typically print the medication instructions only on the boxes and not directly on the medication tubes.   If your medication is too expensive, please contact our office at [phone] option 4 or send us a message through MyChart.   We are unable to tell what your co-pay for medications will be in advance as this is different depending on your insurance coverage. However, we may be able to find a substitute medication at lower cost or fill out paperwork to get insurance to cover a needed medication.   If a prior authorization is required to get your medication covered by your insurance company, please allow us 1-2 business days to complete this process.  Drug prices often vary depending on where the prescription is filled and some pharmacies may offer cheaper prices.  The website www.goodrx.com contains coupons for medications through different pharmacies. The prices here do not account for what the cost may be with help from insurance (it may be cheaper with your insurance), but the website can give you the price if you did not use any insurance.  - You can print the associated coupon and take it with your prescription to the pharmacy.  - You may also stop by our office during regular business  hours and pick up a GoodRx coupon card.  - If you need your prescription sent electronically to a different pharmacy, notify our office through [facility] MyChart or by phone at [phone] option 4.  

## 2020-11-28 MED ORDER — SERTRALINE HCL 50 MG PO TABS: 1.0000 | ORAL_TABLET | Freq: Every day | ORAL | 0 refills | Status: DC

## 2021-01-03 ENCOUNTER — Other Ambulatory Visit: Payer: Self-pay

## 2021-01-03 ENCOUNTER — Ambulatory Visit (INDEPENDENT_AMBULATORY_CARE_PROVIDER_SITE_OTHER): Payer: Managed Care, Other (non HMO) | Admitting: Internal Medicine

## 2021-01-03 ENCOUNTER — Encounter: Payer: Self-pay | Admitting: Internal Medicine

## 2021-01-03 VITALS — BP 110/74 | HR 70 | Temp 96.8°F | Resp 14 | Ht 63.0 in | Wt 119.2 lb

## 2021-01-03 DIAGNOSIS — Z0000 Encounter for general adult medical examination without abnormal findings: Secondary | ICD-10-CM

## 2021-01-03 DIAGNOSIS — Z124 Encounter for screening for malignant neoplasm of cervix: Secondary | ICD-10-CM | POA: Diagnosis not present

## 2021-01-03 DIAGNOSIS — F411 Generalized anxiety disorder: Secondary | ICD-10-CM | POA: Diagnosis not present

## 2021-01-03 NOTE — Assessment & Plan Note (Signed)

## 2021-01-03 NOTE — Assessment & Plan Note (Signed)
Managed with zoloft,  No longer taking xanax.  zoloft  Refill given.  .  Discussed managing her insomnia with trial of melatonin

## 2021-01-03 NOTE — Patient Instructions (Addendum)
I recommend trying melatonin for your insomnia.  It is not a sedative,  But must be taken on  a regular basis to help your internal clock.  Take every evening after dinner start with 5 mg dose   Max effective dose is 10 mg

## 2021-01-03 NOTE — Progress Notes (Signed)
Patient ID: Cynthia Houston, female    DOB: March 28, 1966  Age: 55 y.o. MRN: [id number]  The patient is here for annual preventive  examination and management of other chronic and acute problems.   The risk factors are reflected in the social history.  The roster of all physicians providing medical care to patient - is listed in the Snapshot section of the chart.  Activities of daily living:  The patient is 100% independent in all ADLs: dressing, toileting, feeding as well as independent mobility  Home safety : The patient has smoke detectors in the home. They wear seatbelts.  There are no firearms at home. There is no violence in the home.   There is no risks for hepatitis, STDs or HIV. There is no   history of blood transfusion. They have no travel history to infectious disease endemic areas of the world.  The patient has seen their dentist in the last six month. They have seen their eye doctor in the last year. They admit to slight hearing difficulty with regard to whispered voices and some television programs.  They have deferred audiologic testing in the last year.  They do not  have excessive sun exposure. Discussed the need for sun protection: hats, long sleeves and use of sunscreen if there is significant sun exposure.   Diet: the importance of a healthy diet is discussed. They do have a healthy diet.  The benefits of regular aerobic exercise were discussed. She walks 4 times per week ,  20 minutes.   Depression screen: there are no signs or vegative symptoms of depression- irritability, change in appetite, anhedonia, sadness/tearfullness.  The following portions of the patient's history were reviewed and updated as appropriate: allergies, current medications, past family history, past medical history,  past surgical history, past social history  and problem list.  Visual acuity was not assessed per patient preference since she has regular follow up with her ophthalmologist. Hearing and  body mass index were assessed and reviewed.   During the course of the visit the patient was educated and counseled about appropriate screening and preventive services including : fall prevention , diabetes screening, nutrition counseling, colorectal cancer screening, and recommended immunizations.    CC: The primary encounter diagnosis was Encounter for preventive health examination. Diagnoses of Cervical cancer screening and Generalized anxiety disorder were also pertinent to this visit.  History Cynthia Houston has a past medical history of Chicken pox, Depression, Diffuse cystic mastopathy (2013), Family history of adverse reaction to anesthesia, Headache(784.0), dysplastic nevus (03/07/2007), Osteoarthritis, PONV (postoperative nausea and vomiting), Tendinitis, and Urinary tract infection.   She has a past surgical history that includes Ectopic pregnancy surgery (1989); Uterine fibroid surgery (2002); Abdominal hysterectomy (2008); gynecological surgery  (2004); Diagnostic laparoscopy; Tubal ligation; Bunionectomy (Right, 2011); Colonoscopy (2008); Breast cyst aspiration (Right); Bunionectomy (Left, 07/16/2019); Aiken osteotomy (Left, 07/16/2019); Weil osteotomy (Left, 07/16/2019); and Colonoscopy with propofol (N/A, 05/24/2020).   Her family history includes Arthritis in her mother; Crohn's disease in her father; Heart disease (age of onset: 75) in her father; Hyperlipidemia in her father and mother; Hypertension in her father and mother.She reports that she has never smoked. She has never used smokeless tobacco. She reports current alcohol use of about 2.0 standard drinks of alcohol per week. She reports that she does not use drugs.  Outpatient Medications Prior to Visit  Medication Sig Dispense Refill   BIOTIN PO Take by mouth daily.     Multiple Vitamin (MULTIVITAMIN) tablet Take 1 tablet  by mouth daily.     sertraline (ZOLOFT) 50 MG tablet Take 1 tablet (50 mg total) by mouth daily. 90 tablet 0   No  facility-administered medications prior to visit.    Review of Systems  Patient denies headache, fevers, malaise, unintentional weight loss, skin rash, eye pain, sinus congestion and sinus pain, sore throat, dysphagia,  hemoptysis , cough, dyspnea, wheezing, chest pain, palpitations, orthopnea, edema, abdominal pain, nausea, melena, diarrhea, constipation, flank pain, dysuria, hematuria, urinary  Frequency, nocturia, numbness, tingling, seizures,  Focal weakness, Loss of consciousness,  Tremor, insomnia, depression, anxiety, and suicidal ideation.    Objective:  BP 110/74 (BP Location: Left Arm, Patient Position: Sitting, Cuff Size: Normal)   Pulse 70   Temp (!) 96.8 F (36 C) (Temporal)   Resp 14   Ht 5\' 3"  (1.6 m)   Wt 119 lb 3.2 oz (54.1 kg)   SpO2 98%   BMI 21.12 kg/m   Physical Exam General Appearance:    Alert, cooperative, no distress, appears stated age  Head:    Normocephalic, without obvious abnormality, atraumatic  Eyes:    PERRL, conjunctiva/corneas clear, EOM's intact, fundi    benign, both eyes  Ears:    Normal TM's and external ear canals, both ears  Nose:   Nares normal, septum midline, mucosa normal, no drainage    or sinus tenderness  Throat:   Lips, mucosa, and tongue normal; teeth and gums normal  Neck:   Supple, symmetrical, trachea midline, no adenopathy;    thyroid:  no enlargement/tenderness/nodules; no carotid   bruit or JVD  Back:     Symmetric, no curvature, ROM normal, no CVA tenderness  Lungs:     Clear to auscultation bilaterally, respirations unlabored  Chest Wall:    No tenderness or deformity   Heart:    Regular rate and rhythm, S1 and S2 normal, no murmur, rub   or gallop  Breast Exam:    No tenderness, masses, or nipple abnormality  Abdomen:     Soft, non-tender, bowel sounds active all four quadrants,    no masses, no organomegaly  Genitalia:    Pelvic: cervix normal in appearance, external genitalia normal, no adnexal masses or tenderness,  no cervical motion tenderness, rectovaginal septum normal, uterus normal size, shape, and consistency and vagina normal without discharge  Extremities:   Extremities normal, atraumatic, no cyanosis or edema  Pulses:   2+ and symmetric all extremities  Skin:   Skin color, texture, turgor normal, no rashes or lesions  Lymph nodes:   Cervical, supraclavicular, and axillary nodes normal  Neurologic:   CNII-XII intact, normal strength, sensation and reflexes    throughout     Assessment & Plan:   Problem List Items Addressed This Visit       Unprioritized   Encounter for preventive health examination - Primary    age appropriate education and counseling updated, referrals for preventative services and immunizations addressed, dietary and smoking counseling addressed, most recent labs reviewed.  I have personally reviewed and have noted:   1) the patient's medical and social history 2) The pt's use of alcohol, tobacco, and illicit drugs 3) The patient's current medications and supplements 4) Functional ability including ADL's, fall risk, home safety risk, hearing and visual impairment 5) Diet and physical activities 6) Evidence for depression or mood disorder 7) The patient's height, weight, and BMI have been recorded in the chart   I have made referrals, and provided counseling and education based on  review of the above       Relevant Orders   CBC with Differential/Platelet   Comprehensive metabolic panel   SARS-CoV-2 Semi-Quantitative Total Antibody, Spike   Lipid panel   TSH   Generalized anxiety disorder    Managed with zoloft,  No longer taking xanax.  zoloft  Refill given.  .  Discussed managing her insomnia with trial of melatonin       Other Visit Diagnoses     Cervical cancer screening       Relevant Orders   IGP, Aptima HPV       I am having Cynthia Houston. Delgreco maintain her multivitamin, BIOTIN PO, and sertraline.  No orders of the defined types were placed in  this encounter.   There are no discontinued medications.  Follow-up: No follow-ups on file.   Crecencio Mc, MD

## 2021-01-06 LAB — IGP, APTIMA HPV: HPV Aptima: NEGATIVE

## 2021-01-27 ENCOUNTER — Telehealth: Payer: Self-pay | Admitting: Internal Medicine

## 2021-01-27 MED ORDER — ERGOCALCIFEROL 1.25 MG (50000 UT) PO CAPS: 50000.0000 [IU] | ORAL_CAPSULE | ORAL | 3 refills | Status: DC

## 2021-01-27 NOTE — Telephone Encounter (Signed)
Please remove ergocalciferol (DRISDOL) 1.25 MG (50000 UT) capsule from patient's medication list. This medication was suppose to go to her mother.

## 2021-02-02 LAB — LIPID PANEL
Chol/HDL Ratio: 2.6 ratio (ref 0.0–4.4)
Cholesterol, Total: 201 mg/dL — ABNORMAL HIGH (ref 100–199)
HDL: 78 mg/dL (ref >39)
LDL Chol Calc (NIH): 113 mg/dL — ABNORMAL HIGH (ref 0–99)
Triglycerides: 53 mg/dL (ref 0–149)
VLDL Cholesterol Cal: 10 mg/dL (ref 5–40)

## 2021-02-02 LAB — CBC WITH DIFFERENTIAL/PLATELET
Basophils Absolute: 0 10*3/uL (ref 0.0–0.2)
Basos: 1 %
EOS (ABSOLUTE): 0.4 10*3/uL (ref 0.0–0.4)
Eos: 8 %
Hematocrit: 41.2 % (ref 34.0–46.6)
Hemoglobin: 13.8 g/dL (ref 11.1–15.9)
Immature Grans (Abs): 0 10*3/uL (ref 0.0–0.1)
Immature Granulocytes: 0 %
Lymphocytes Absolute: 1.8 10*3/uL (ref 0.7–3.1)
Lymphs: 38 %
MCH: 30.3 pg (ref 26.6–33.0)
MCHC: 33.5 g/dL (ref 31.5–35.7)
MCV: 90 fL (ref 79–97)
Monocytes Absolute: 0.4 10*3/uL (ref 0.1–0.9)
Monocytes: 9 %
Neutrophils Absolute: 2.1 10*3/uL (ref 1.4–7.0)
Neutrophils: 44 %
Platelets: 237 10*3/uL (ref 150–450)
RBC: 4.56 x10E6/uL (ref 3.77–5.28)
RDW: 12.7 % (ref 11.7–15.4)
WBC: 4.7 10*3/uL (ref 3.4–10.8)

## 2021-02-02 LAB — COMPREHENSIVE METABOLIC PANEL
ALT: 22 IU/L (ref 0–32)
AST: 16 IU/L (ref 0–40)
Albumin/Globulin Ratio: 2 (ref 1.2–2.2)
Albumin: 4.4 g/dL (ref 3.8–4.9)
Alkaline Phosphatase: 59 IU/L (ref 44–121)
BUN/Creatinine Ratio: 17 (ref 9–23)
BUN: 14 mg/dL (ref 6–24)
Bilirubin Total: 0.4 mg/dL (ref 0.0–1.2)
CO2: 23 mmol/L (ref 20–29)
Calcium: 9.3 mg/dL (ref 8.7–10.2)
Chloride: 102 mmol/L (ref 96–106)
Creatinine, Ser: 0.84 mg/dL (ref 0.57–1.00)
Globulin, Total: 2.2 g/dL (ref 1.5–4.5)
Glucose: 99 mg/dL (ref 65–99)
Potassium: 4.6 mmol/L (ref 3.5–5.2)
Sodium: 138 mmol/L (ref 134–144)
Total Protein: 6.6 g/dL (ref 6.0–8.5)
eGFR: 81 mL/min/{1.73_m2} (ref >59)

## 2021-02-02 LAB — SARS-COV-2 SEMI-QUANTITATIVE TOTAL ANTIBODY, SPIKE: SARS-CoV-2 Spike Ab Interp: POSITIVE

## 2021-02-02 LAB — SARS-COV-2 SPIKE AB DILUTION: SARS-CoV-2 Spike Ab Dilution: 2888 U/mL (ref <0.8)

## 2021-02-02 LAB — TSH: TSH: 1.43 u[IU]/mL (ref 0.450–4.500)

## 2021-02-06 NOTE — Telephone Encounter (Signed)
Signed and faxed

## 2021-02-07 NOTE — Progress Notes (Signed)
   Your COVID 19 antibody test was positive ,  indicating past infection .or vaccination.   It is not protective against future infections, (just as the vaccines are not protective)  , but may reduce your risk of a severe infection   Regards,   Deborra Medina, MD

## 2021-02-08 NOTE — Telephone Encounter (Signed)
Please advise 

## 2021-03-07 ENCOUNTER — Other Ambulatory Visit: Payer: Self-pay | Admitting: Internal Medicine

## 2021-03-07 DIAGNOSIS — Z1231 Encounter for screening mammogram for malignant neoplasm of breast: Secondary | ICD-10-CM

## 2021-04-25 ENCOUNTER — Other Ambulatory Visit: Payer: Self-pay

## 2021-04-25 ENCOUNTER — Ambulatory Visit
Admission: RE | Admit: 2021-04-25 | Discharge: 2021-04-25 | Disposition: A | Discharge status: Home / Self Care | Payer: Managed Care, Other (non HMO) | Source: Ambulatory Visit | Attending: Internal Medicine | Admitting: Internal Medicine

## 2021-04-25 DIAGNOSIS — Z1231 Encounter for screening mammogram for malignant neoplasm of breast: Secondary | ICD-10-CM | POA: Insufficient documentation

## 2021-05-09 ENCOUNTER — Other Ambulatory Visit: Payer: Self-pay

## 2021-05-09 ENCOUNTER — Ambulatory Visit: Payer: Managed Care, Other (non HMO) | Admitting: Family

## 2021-05-09 ENCOUNTER — Encounter: Payer: Self-pay | Admitting: Family

## 2021-05-09 VITALS — BP 110/68 | HR 82 | Temp 98.1°F | Ht 63.0 in | Wt 121.4 lb

## 2021-05-09 DIAGNOSIS — R350 Frequency of micturition: Secondary | ICD-10-CM | POA: Diagnosis not present

## 2021-05-09 DIAGNOSIS — R10A0 Flank pain, unspecified side: Secondary | ICD-10-CM

## 2021-05-09 DIAGNOSIS — R109 Unspecified abdominal pain: Secondary | ICD-10-CM | POA: Diagnosis not present

## 2021-05-09 LAB — POCT URINALYSIS DIPSTICK
Bilirubin, UA: NEGATIVE
Blood, UA: NEGATIVE
Glucose, UA: NEGATIVE
Ketones, UA: NEGATIVE
Leukocytes, UA: NEGATIVE
Nitrite, UA: NEGATIVE
Protein, UA: NEGATIVE
Specific Gravity: 1.01
Urobilinogen, UA: 0.2 E.U./dL
pH, UA: 7 (ref 5.0–8.0)

## 2021-05-09 MED ORDER — NITROFURANTOIN MONOHYD MACRO 100 MG PO CAPS: 100.0000 mg | ORAL_CAPSULE | Freq: Two times a day (BID) | ORAL | 0 refills | Status: DC

## 2021-05-09 NOTE — Addendum Note (Signed)
Addended by: Leeanne Rio on: 05/09/2021 03:40 PM   Modules accepted: Orders

## 2021-05-09 NOTE — Progress Notes (Signed)
Acute Office Visit  Subjective:    Patient ID: Cynthia Houston, female    DOB: April 10, 1966, 55 y.o.   MRN: [id number]  Chief Complaint  Patient presents with   Urinary Tract Infection    HPI Patient is in today with c/o urinary frequency, urgency and lower back pain. Denies any fever or chills. No blood in the urine or foul smelling urine.   Past Medical History:  Diagnosis Date   Chicken pox    Depression    Diffuse cystic mastopathy 2013   Family history of adverse reaction to anesthesia    Mother - PONV   Headache(784.0)    Hx of dysplastic nevus 03/07/2007   right lateral thigh, slight to mod   Osteoarthritis    right hand   PONV (postoperative nausea and vomiting)    Tendinitis    right elbow   Urinary tract infection     Past Surgical History:  Procedure Laterality Date   ABDOMINAL HYSTERECTOMY  2008   supracervical    AIKEN OSTEOTOMY Left 07/16/2019   Procedure: PHALANX OSTEOTOMY AKIN;  Surgeon: Albertine Patricia, DPM;  Location: [facility];  Service: Podiatry;  Laterality: Left;   BREAST CYST ASPIRATION Right    neg   BUNIONECTOMY Right 2011   BUNIONECTOMY Left 07/16/2019   Procedure: LAPIDUS-TYPE;  Surgeon: Albertine Patricia, DPM;  Location: [facility];  Service: Podiatry;  Laterality: Left;  LMA WITH BLOCK   COLONOSCOPY  2008   Dr. Vira Agar   COLONOSCOPY WITH PROPOFOL N/A 05/24/2020   Procedure: COLONOSCOPY WITH PROPOFOL;  Surgeon: Lucilla Lame, MD;  Location[facility] ENDOSCOPY;  Service: Endoscopy;  Laterality: N/A;   DIAGNOSTIC LAPAROSCOPY     [place]   gynecological surgery   2004   TUBAL LIGATION     UTERINE FIBROID SURGERY  2002   x2 2004   WEIL OSTEOTOMY Left 07/16/2019   Procedure: WEIL OSTEOTOMY;  Surgeon: Albertine Patricia, DPM;  Location: [facility];  Service: Podiatry;  Laterality: Left;    Family History  Problem Relation Age of Onset   Hyperlipidemia Mother    Hypertension Mother    Arthritis  Mother        rheumatoid arthritis   Crohn's disease Father    Heart disease Father 31       CABG   Hyperlipidemia Father    Hypertension Father    Breast cancer Neg Hx     Social History   Socioeconomic History   Marital status: Divorced    Spouse name: Not on file   Number of children: Not on file   Years of education: Not on file   Highest education level: Not on file  Occupational History   Not on file  Tobacco Use   Smoking status: Never   Smokeless tobacco: Never  Vaping Use   Vaping Use: Never used  Substance and Sexual Activity   Alcohol use: Yes    Alcohol/week: 2.0 standard drinks    Types: 2 Glasses of wine per week   Drug use: No   Sexual activity: Not on file  Other Topics Concern   Not on file  Social History Narrative   Not on file   Social Determinants of Health   Financial Resource Strain: Not on file  Food Insecurity: Not on file  Transportation Needs: Not on file  Physical Activity: Not on file  Stress: Not on file  Social Connections: Not on file  Intimate Partner Violence:  Not on file    Outpatient Medications Prior to Visit  Medication Sig Dispense Refill   BIOTIN PO Take by mouth daily.     Multiple Vitamin (MULTIVITAMIN) tablet Take 1 tablet by mouth daily.     sertraline (ZOLOFT) 50 MG tablet Take 1 tablet (50 mg total) by mouth daily. 90 tablet 0   No facility-administered medications prior to visit.    Allergies  Allergen Reactions   Sulfa Antibiotics Rash        Fiorinal [Butalbital-Aspirin-Caffeine] Nausea And Vomiting    Also drop in BP   Percocet [Oxycodone-Acetaminophen] Nausea And Vomiting    Also drop in BP   Augmentin [Amoxicillin-Pot Clavulanate] Rash   Doxycycline Rash    Review of Systems  Respiratory: Negative.    Cardiovascular: Negative.   Gastrointestinal: Negative.   Genitourinary:  Positive for frequency and urgency. Negative for dysuria, hematuria and pelvic pain.  Musculoskeletal: Negative.   Skin:  Negative.   Allergic/Immunologic: Negative.   Psychiatric/Behavioral: Negative.    All other systems reviewed and are negative.     Objective:    Physical Exam Vitals and nursing note reviewed.  Constitutional:      Appearance: Normal appearance.  Cardiovascular:     Rate and Rhythm: Normal rate and regular rhythm.  Pulmonary:     Effort: Pulmonary effort is normal.     Breath sounds: Normal breath sounds.  Abdominal:     General: Abdomen is flat.     Palpations: Abdomen is soft.  Musculoskeletal:        General: Normal range of motion.     Cervical back: Normal range of motion and neck supple.  Skin:    General: Skin is warm and dry.  Neurological:     General: No focal deficit present.     Mental Status: She is alert and oriented to person, place, and time.    BP 110/68 (BP Location: Left Arm, Patient Position: Sitting, Cuff Size: Normal)   Pulse 82   Temp 98.1 F (36.7 C) (Oral)   Ht '5\' 3"'  (1.6 m)   Wt 121 lb 6.4 oz (55.1 kg)   SpO2 98%   BMI 21.51 kg/m  Wt Readings from Last 3 Encounters:  05/09/21 121 lb 6.4 oz (55.1 kg)  01/03/21 119 lb 3.2 oz (54.1 kg)  05/24/20 114 lb (51.7 kg)    Health Maintenance Due  Topic Date Due   Pneumococcal Vaccine 55-56 Years old (1 - PCV) Never done   Zoster Vaccines- Shingrix (1 of 2) Never done   INFLUENZA VACCINE  01/23/2021    There are no preventive care reminders to display for this patient.   Lab Results  Component Value Date   TSH 1.430 01/31/2021   Lab Results  Component Value Date   WBC 4.7 01/31/2021   HGB 13.8 01/31/2021   HCT 41.2 01/31/2021   MCV 90 01/31/2021   PLT 237 01/31/2021   Lab Results  Component Value Date   NA 138 01/31/2021   K 4.6 01/31/2021   CO2 23 01/31/2021   GLUCOSE 99 01/31/2021   BUN 14 01/31/2021   CREATININE 0.84 01/31/2021   BILITOT 0.4 01/31/2021   ALKPHOS 59 01/31/2021   AST 16 01/31/2021   ALT 22 01/31/2021   PROT 6.6 01/31/2021   ALBUMIN 4.4 01/31/2021    CALCIUM 9.3 01/31/2021   EGFR 81 01/31/2021   Lab Results  Component Value Date   CHOL 201 (H) 01/31/2021   Lab Results  Component Value Date   HDL 78 01/31/2021   Lab Results  Component Value Date   LDLCALC 113 (H) 01/31/2021   Lab Results  Component Value Date   TRIG 53 01/31/2021   Lab Results  Component Value Date   CHOLHDL 2.6 01/31/2021   Lab Results  Component Value Date   HGBA1C 5.6 01/06/2020       Assessment & Plan:   Problem List Items Addressed This Visit     Urinary frequency   Relevant Orders   Urine Culture   Other Visit Diagnoses     Flank pain    -  Primary   Relevant Orders   POCT Urinalysis Dipstick (Completed)   Urine Culture   Urinalysis, Routine w reflex microscopic   Urine Culture        Meds ordered this encounter  Medications   nitrofurantoin, macrocrystal-monohydrate, (MACROBID) 100 MG capsule    Sig: Take 1 capsule (100 mg total) by mouth 2 (two) times daily.    Dispense:  14 capsule    Refill:  0   Urine culture sent. Will notify patient pending results. Antibiotic sent if symptoms worsen before the culture returns. Drink plenty of fluids, cranberry juice. Call the any questions or concerns.   Kennyth Arnold, FNP

## 2021-05-10 LAB — URINALYSIS, ROUTINE W REFLEX MICROSCOPIC
Bilirubin Urine: NEGATIVE
Hgb urine dipstick: NEGATIVE
Ketones, ur: NEGATIVE
Leukocytes,Ua: NEGATIVE
Nitrite: NEGATIVE
RBC / HPF: NONE SEEN (ref 0–?)
Specific Gravity, Urine: <=1.005 — AB (ref 1.000–1.030)
Total Protein, Urine: NEGATIVE
Urine Glucose: NEGATIVE
Urobilinogen, UA: 0.2 (ref 0.0–1.0)
WBC, UA: NONE SEEN (ref 0–?)
pH: 7 (ref 5.0–8.0)

## 2021-05-10 LAB — URINE CULTURE
MICRO NUMBER:: 12639433
Result:: NO GROWTH
SPECIMEN QUALITY:: ADEQUATE

## 2021-07-03 ENCOUNTER — Other Ambulatory Visit: Payer: Self-pay | Admitting: Internal Medicine

## 2021-07-25 ENCOUNTER — Encounter: Payer: Self-pay | Admitting: Internal Medicine

## 2021-07-25 ENCOUNTER — Other Ambulatory Visit: Payer: Self-pay

## 2021-07-25 ENCOUNTER — Ambulatory Visit: Payer: Managed Care, Other (non HMO) | Admitting: Internal Medicine

## 2021-07-25 VITALS — BP 124/80 | HR 81 | Temp 97.9°F | Ht 63.0 in | Wt 118.8 lb

## 2021-07-25 DIAGNOSIS — H6120 Impacted cerumen, unspecified ear: Secondary | ICD-10-CM | POA: Insufficient documentation

## 2021-07-25 DIAGNOSIS — H6122 Impacted cerumen, left ear: Secondary | ICD-10-CM

## 2021-07-25 DIAGNOSIS — H6993 Unspecified Eustachian tube disorder, bilateral: Secondary | ICD-10-CM

## 2021-07-25 DIAGNOSIS — R079 Chest pain, unspecified: Secondary | ICD-10-CM

## 2021-07-25 DIAGNOSIS — D709 Neutropenia, unspecified: Secondary | ICD-10-CM | POA: Diagnosis not present

## 2021-07-25 DIAGNOSIS — I479 Paroxysmal tachycardia, unspecified: Secondary | ICD-10-CM | POA: Diagnosis not present

## 2021-07-25 DIAGNOSIS — R0789 Other chest pain: Secondary | ICD-10-CM

## 2021-07-25 DIAGNOSIS — F411 Generalized anxiety disorder: Secondary | ICD-10-CM

## 2021-07-25 MED ORDER — ALPRAZOLAM 0.25 MG PO TABS: 0.2500 mg | ORAL_TABLET | Freq: Every day | ORAL | 0 refills | Status: DC | PRN

## 2021-07-25 MED ORDER — OMEPRAZOLE 20 MG PO CPDR: 20.0000 mg | DELAYED_RELEASE_CAPSULE | Freq: Every day | ORAL | 3 refills | Status: DC

## 2021-07-25 MED ORDER — FLUTICASONE PROPIONATE 50 MCG/ACT NA SUSP: 2.0000 | Freq: Every day | NASAL | 6 refills | Status: DC

## 2021-07-25 NOTE — Assessment & Plan Note (Addendum)
I have ordered and reviewed a 12 lead EKG and find that there are no acute changes and patient is in sinus rhythm.  Will treat the episodes as GERD with omeprazole and alprazolam prn anxiety provoked episodes

## 2021-07-25 NOTE — Assessment & Plan Note (Signed)
Left ear,  Debrox advised.

## 2021-07-25 NOTE — Patient Instructions (Signed)
Your heart is fine.  You're having reflux and increased anxiety  Omeprazole once daily in the morning for 14 days for GERD .  You may  then suspend to see if there is a change in symptoms   Increase the zoloft to 50 mg   I am prescribing xanax to help you through those moments of extreme duress    For the ears and sinuses:   Debrox is an oil for ear wax,  use it once daily in the evening to help soften ear wax  Flonase once daily for Eustachian tubes and sinus congestion

## 2021-07-25 NOTE — Assessment & Plan Note (Signed)
Following a case of viral sinusitis.  flonase nasal spray prescribed

## 2021-07-25 NOTE — Assessment & Plan Note (Signed)
Aggravated by family stressors.  Advised to increase zoloft to 50 mg daily and add alprazolam for use prn  panic attacks.

## 2021-07-25 NOTE — Progress Notes (Signed)
Subjective:  Patient ID: Cynthia Houston, female    DOB: [birth year]  Age: 56 y.o. MRN: [id number]  CC: The primary encounter diagnosis was Chest pain, unspecified type. Diagnoses of Paroxysmal tachycardia, unspecified ([place]), Neutropenia, unspecified type ([place]), Atypical chest pain, Generalized anxiety disorder, Eustachian tube disorder, bilateral, and Impacted cerumen of left ear were also pertinent to this visit.   This visit occurred during the SARS-CoV-2 [place] health emergency.  Safety protocols were in place, including screening questions prior to the visit, additional usage of staff PPE, and extensive cleaning of exam room while observing appropriate contact time as indicated for disinfecting solutions.    HPI Cynthia Houston presents for follow up on anxiety Chief Complaint  Patient presents with   Follow-up    Follow up on anxiety   1) GERD:  Has been having episodes of non exertional chest pressure ,  the  episodes are  not occurring right after eating.  They will last for hours, and are not accompanied by diaphoresis, nausea or jaw pain.  She has also been having Tension headaches that start at the base of her shoulders and neck.   Has been treating with tums.  No change in diet,  no weight loss,  no change in BMS  2) lingering sinus and ear congestion since early January.  Still having rhinitis clear drainage.  3)  GAD:  aggravated by her  increased stress and responsibilities due to her  mother Cynthia Houston's worsening dementia .  She has 2 caregivers during the waking hours,  but continues to decline.  She is now incontinent of urine during the day and seemingly unaware of the odor ,  and is not able to take her medications despite having a pillbox filled for her by [facility] is in the process of trying to find an assisted living facility , but her mother is resisting .  She is having frequent panic episodes and taking only 25 mg sertraline daily.   Outpatient Medications Prior to  Visit  Medication Sig Dispense Refill   Multiple Vitamin (MULTIVITAMIN) tablet Take 1 tablet by mouth daily.     sertraline (ZOLOFT) 50 MG tablet TAKE ONE TABLET BY MOUTH EVERY DAY (Patient taking differently: Take 25 mg by mouth daily.) 90 tablet 1   BIOTIN PO Take by mouth daily. (Patient not taking: Reported on 07/25/2021)     nitrofurantoin, macrocrystal-monohydrate, (MACROBID) 100 MG capsule Take 1 capsule (100 mg total) by mouth 2 (two) times daily. (Patient not taking: Reported on 07/25/2021) 14 capsule 0   No facility-administered medications prior to visit.    Review of Systems;  Patient denies headache, fevers, malaise, unintentional weight loss, skin rash, eye pain, sinus congestion and sinus pain, sore throat, dysphagia,  hemoptysis , cough, dyspnea, wheezing, chest pain, palpitations, orthopnea, edema, abdominal pain, nausea, melena, diarrhea, constipation, flank pain, dysuria, hematuria, urinary  Frequency, nocturia, numbness, tingling, seizures,  Focal weakness, Loss of consciousness,  Tremor, insomnia, depression, anxiety, and suicidal ideation.      Objective:  BP 124/80 (BP Location: Left Arm, Patient Position: Sitting, Cuff Size: Normal)    Pulse 81    Temp 97.9 F (36.6 C) (Oral)    Ht 5\' 3"  (1.6 m)    Wt 118 lb 12.8 oz (53.9 kg)    SpO2 99%    BMI 21.04 kg/m   BP Readings from Last 3 Encounters:  07/25/21 124/80  05/09/21 110/68  01/03/21 110/74    Wt Readings from  Last 3 Encounters:  07/25/21 118 lb 12.8 oz (53.9 kg)  05/09/21 121 lb 6.4 oz (55.1 kg)  01/03/21 119 lb 3.2 oz (54.1 kg)    General appearance: alert, cooperative and appears stated age Ears: normal TM's and left ear with cerumen impaction .  Throat: lips, mucosa, and tongue normal; teeth and gums normal Neck: no adenopathy, no carotid bruit, supple, symmetrical, trachea midline and thyroid not enlarged, symmetric, no tenderness/mass/nodules Back: symmetric, no curvature. ROM normal. No CVA  tenderness. Lungs: clear to auscultation bilaterally Heart: regular rate and rhythm, S1, S2 normal, no murmur, click, rub or gallop Abdomen: soft, non-tender; bowel sounds normal; no masses,  no organomegaly Pulses: 2+ and symmetric Skin: Skin color, texture, turgor normal. No rashes or lesions Lymph nodes: Cervical, supraclavicular, and axillary nodes normal.  Lab Results  Component Value Date   HGBA1C 5.6 01/06/2020   HGBA1C 5.6 12/19/2016   HGBA1C 5.5 01/03/2016    Lab Results  Component Value Date   CREATININE 0.84 01/31/2021   CREATININE 0.82 01/06/2020   CREATININE 0.84 12/17/2018    Lab Results  Component Value Date   WBC 4.7 01/31/2021   HGB 13.8 01/31/2021   HCT 41.2 01/31/2021   PLT 237 01/31/2021   GLUCOSE 99 01/31/2021   CHOL 201 (H) 01/31/2021   TRIG 53 01/31/2021   HDL 78 01/31/2021   LDLCALC 113 (H) 01/31/2021   ALT 22 01/31/2021   AST 16 01/31/2021   NA 138 01/31/2021   K 4.6 01/31/2021   CL 102 01/31/2021   CREATININE 0.84 01/31/2021   BUN 14 01/31/2021   CO2 23 01/31/2021   TSH 1.430 01/31/2021   HGBA1C 5.6 01/06/2020    MM 3D SCREEN BREAST BILATERAL  Result Date: 04/26/2021 CLINICAL DATA:  Screening. EXAM: DIGITAL SCREENING BILATERAL MAMMOGRAM WITH TOMOSYNTHESIS AND CAD TECHNIQUE: Bilateral screening digital craniocaudal and mediolateral oblique mammograms were obtained. Bilateral screening digital breast tomosynthesis was performed. The images were evaluated with computer-aided detection. COMPARISON:  Previous exam(s). ACR Breast Density Category d: The breast tissue is extremely dense, which lowers the sensitivity of mammography FINDINGS: There are no findings suspicious for malignancy. IMPRESSION: No mammographic evidence of malignancy. A result letter of this screening mammogram will be mailed directly to the patient. RECOMMENDATION: Screening mammogram in one year. (Code:SM-B-01Y) BI-RADS CATEGORY  1: Negative. Electronically Signed   By:  Ammie Ferrier M.D.   On: 04/26/2021 10:07   Assessment & Plan:   Problem List Items Addressed This Visit     Neutropenia ([place])   Generalized anxiety disorder    Aggravated by family stressors.  Advised to increase zoloft to 50 mg daily and add alprazolam for use prn  panic attacks.       Relevant Medications   ALPRAZolam (XANAX) 0.25 MG tablet   Paroxysmal tachycardia, unspecified (HCC)   Atypical chest pain    I have ordered and reviewed a 12 lead EKG and find that there are no acute changes and patient is in sinus rhythm.  Will treat the episodes as GERD with omeprazole and alprazolam prn anxiety provoked episodes       Eustachian tube disorder, bilateral    Following a case of viral sinusitis.  flonase nasal spray prescribed       Cerumen impaction    Left ear,  Debrox advised.       Other Visit Diagnoses     Chest pain, unspecified type    -  Primary   Relevant Orders  EKG 12-Lead (Completed)       I spent 30 minutes dedicated to the care of this patient on the date of this encounter to include pre-visit review of patient's medical history,  most recent imaging studies, Face-to-face time with the patient , and post visit ordering of testing and therapeutics.    Follow-up: No follow-ups on file.   Crecencio Mc, MD

## 2021-08-24 ENCOUNTER — Telehealth: Payer: Self-pay | Admitting: Internal Medicine

## 2021-08-24 NOTE — Telephone Encounter (Signed)
Pt is scheduled with Dr. Derrel Nip tomorrow at 8 am.  ?

## 2021-08-24 NOTE — Telephone Encounter (Signed)
Pt called stating for about a week now she has been having intestinal issues. Pt has been cramping, loose bowel-not normal color. Sent to access nurse ?

## 2021-08-25 ENCOUNTER — Other Ambulatory Visit
Admission: RE | Admit: 2021-08-25 | Discharge: 2021-08-25 | Disposition: A | Discharge status: Home / Self Care | Payer: Managed Care, Other (non HMO) | Source: Ambulatory Visit | Attending: Internal Medicine | Admitting: Internal Medicine

## 2021-08-25 ENCOUNTER — Encounter: Payer: Self-pay | Admitting: Internal Medicine

## 2021-08-25 ENCOUNTER — Ambulatory Visit: Payer: Managed Care, Other (non HMO) | Admitting: Internal Medicine

## 2021-08-25 ENCOUNTER — Other Ambulatory Visit: Payer: Self-pay

## 2021-08-25 VITALS — BP 112/74 | HR 69 | Temp 98.3°F | Ht 63.0 in | Wt 118.8 lb

## 2021-08-25 DIAGNOSIS — R197 Diarrhea, unspecified: Secondary | ICD-10-CM

## 2021-08-25 DIAGNOSIS — A09 Infectious gastroenteritis and colitis, unspecified: Secondary | ICD-10-CM | POA: Insufficient documentation

## 2021-08-25 LAB — GASTROINTESTINAL PANEL BY PCR, STOOL (REPLACES STOOL CULTURE)

## 2021-08-25 MED ORDER — CIPROFLOXACIN HCL 500 MG PO TABS: 500.0000 mg | ORAL_TABLET | Freq: Two times a day (BID) | ORAL | 0 refills | Status: AC

## 2021-08-25 NOTE — Patient Instructions (Signed)
I suspect your diarrhea is due to E coli or some other bacteria from the strawberries ? ? ?Collect your stool sample,  THEN start the cipro  ? ?Take the stool sample to the [facility]  (NOT OUR OFFICE) ? ? ?BLAND DIET UNTIL STOOLS BECOME MORE SOLID ? ?NO ARTIFICIAL SWEETNERS ?NO DAIRY ?NO GREASE ?NO FRESH VEGGIES OR SALAD ?APPLES  APPLESAUCE,  RICE,  OATMEAL,  BOILED CHICKEN , MASHED POTATOES OK  ?

## 2021-08-25 NOTE — Progress Notes (Signed)
? ?Subjective:  ?Patient ID: Cynthia [place], female    DOB: Nov 08, 1965  Age: 56 y.o. MRN: [id number] ? ?CC: The primary encounter diagnosis was Diarrhea of presumed infectious origin. A diagnosis of Diarrhea of infectious origin was also pertinent to this visit. ? ? ?This visit occurred during the SARS-CoV-2 [place] health emergency.  Safety protocols were in place, including screening questions prior to the visit, additional usage of staff PPE, and extensive cleaning of exam room while observing appropriate contact time as indicated for disinfecting solutions.   ? ?HPI ?Cynthia Houston presents for  ?Chief Complaint  ?Patient presents with  ? GI Problem  ?  Abdominal cramping loose yellow, weird smelling stools for about 10 days. No recent antibiotic use. Upper L Quadrant Pain. 3-4/10 days. Pulling , aching feeling, sometimes sharp.   ? ?Patient has been having recurrent Loose stools since last Wednesday.accompanied by cramping and urgency with one episode of fecal incontinence. Some mucus in stools,  no blood. At least 3-4 daily ,  occurring in the middle of the night as well.  Has switched to a bland diet ,  (eggs, toast,  oatmeal)  with no change in frequency, still  3-4 times daily.   Timing not related to food.  No fevers , No recent travel  or antibiotics, no raw fish, but ate strawberries the week before and organic greens.   Occasional llq pain but not severe,  distinct from cramping that resolves with stooling.  ? ? ?Outpatient Medications Prior to Visit  ?Medication Sig Dispense Refill  ? ALPRAZolam (XANAX) 0.25 MG tablet Take 1 tablet (0.25 mg total) by mouth daily as needed for anxiety. 20 tablet 0  ? sertraline (ZOLOFT) 50 MG tablet TAKE ONE TABLET BY MOUTH EVERY DAY (Patient taking differently: Take 25 mg by mouth daily.) 90 tablet 1  ? fluticasone (FLONASE) 50 MCG/ACT nasal spray Place 2 sprays into both nostrils daily. (Patient not taking: Reported on 08/24/2021) 16 g 6  ? Multiple Vitamin  (MULTIVITAMIN) tablet Take 1 tablet by mouth daily. (Patient not taking: Reported on 08/24/2021)    ? omeprazole (PRILOSEC) 20 MG capsule Take 1 capsule (20 mg total) by mouth daily. (Patient not taking: Reported on 08/24/2021) 90 capsule 3  ? ?No facility-administered medications prior to visit.  ? ? ?Review of Systems; ? ?Patient denies headache, fevers, malaise, unintentional weight loss, skin rash, eye pain, sinus congestion and sinus pain, sore throat, dysphagia,  hemoptysis , cough, dyspnea, wheezing, chest pain, palpitations, orthopnea, edema, abdominal pain, nausea, melena, diarrhea, constipation, flank pain, dysuria, hematuria, urinary  Frequency, nocturia, numbness, tingling, seizures,  Focal weakness, Loss of consciousness,  Tremor, insomnia, depression, anxiety, and suicidal ideation.   ? ? ? ?Objective:  ?BP 112/74 (BP Location: Left Arm, Patient Position: Sitting, Cuff Size: Small)   Pulse 69   Temp 98.3 ?F (36.8 ?C) (Oral)   Ht 5\' 3"  (1.6 m)   Wt 118 lb 12.8 oz (53.9 kg)   SpO2 99%   BMI 21.04 kg/m?  ? ?BP Readings from Last 3 Encounters:  ?08/25/21 112/74  ?07/25/21 124/80  ?05/09/21 110/68  ? ? ?Wt Readings from Last 3 Encounters:  ?08/25/21 118 lb 12.8 oz (53.9 kg)  ?07/25/21 118 lb 12.8 oz (53.9 kg)  ?05/09/21 121 lb 6.4 oz (55.1 kg)  ? ? ?General appearance: alert, cooperative and appears stated age.  Well appearing  ?Ears: normal TM's and external ear canals both ears ?Throat: lips, mucosa, and tongue normal; teeth  and gums normal ?Neck: no adenopathy, no carotid bruit, supple, symmetrical, trachea midline and thyroid not enlarged, symmetric, no tenderness/mass/nodules ?Back: symmetric, no curvature. ROM normal. No CVA tenderness. ?Lungs: clear to auscultation bilaterally ?Heart: regular rate and rhythm, S1, S2 normal, no murmur, click, rub or gallop ?Abdomen: soft, diffusely tender in both lower quadrants without rebound or guarding  ; bowel sounds normal; no masses,  no organomegaly ?Pulses:  2+ and symmetric ?Skin: Skin color, texture, turgor normal. No rashes or lesions ?Lymph nodes: Cervical, supraclavicular, and axillary nodes normal. ? ?Lab Results  ?Component Value Date  ? HGBA1C 5.6 01/06/2020  ? HGBA1C 5.6 12/19/2016  ? HGBA1C 5.5 01/03/2016  ? ? ?Lab Results  ?Component Value Date  ? CREATININE 0.84 01/31/2021  ? CREATININE 0.82 01/06/2020  ? CREATININE 0.84 12/17/2018  ? ? ?Lab Results  ?Component Value Date  ? WBC 4.7 01/31/2021  ? HGB 13.8 01/31/2021  ? HCT 41.2 01/31/2021  ? PLT 237 01/31/2021  ? GLUCOSE 99 01/31/2021  ? CHOL 201 (H) 01/31/2021  ? TRIG 53 01/31/2021  ? HDL 78 01/31/2021  ? LDLCALC 113 (H) 01/31/2021  ? ALT 22 01/31/2021  ? AST 16 01/31/2021  ? NA 138 01/31/2021  ? K 4.6 01/31/2021  ? CL 102 01/31/2021  ? CREATININE 0.84 01/31/2021  ? BUN 14 01/31/2021  ? CO2 23 01/31/2021  ? TSH 1.430 01/31/2021  ? HGBA1C 5.6 01/06/2020  ? ? ?MM 3D SCREEN BREAST BILATERAL ? ?Result Date: 04/26/2021 ?CLINICAL DATA:  Screening. EXAM: DIGITAL SCREENING BILATERAL MAMMOGRAM WITH TOMOSYNTHESIS AND CAD TECHNIQUE: Bilateral screening digital craniocaudal and mediolateral oblique mammograms were obtained. Bilateral screening digital breast tomosynthesis was performed. The images were evaluated with computer-aided detection. COMPARISON:  Previous exam(s). ACR Breast Density Category d: The breast tissue is extremely dense, which lowers the sensitivity of mammography FINDINGS: There are no findings suspicious for malignancy. IMPRESSION: No mammographic evidence of malignancy. A result letter of this screening mammogram will be mailed directly to the patient. RECOMMENDATION: Screening mammogram in one year. (Code:SM-B-01Y) BI-RADS CATEGORY  1: Negative. Electronically Signed   By: Ammie Ferrier M.D.   On: 04/26/2021 10:07 ? ? ?Assessment & Plan:  ? ?Problem List Items Addressed This Visit   ? ? Diarrhea of infectious origin  ?  PRESUMED,  WITH STRAWBERRIES SUSPECTED.  STOOL CULTURE AND GI PATHOGEN  ORDERED  START CIPRO AFTER STOOL HAS BEEN COLLECTED BLAND DIET OUTLINED UNTIL STOOLS SOLIDIFY ?  ?  ? ?Other Visit Diagnoses   ? ? Diarrhea of presumed infectious origin    -  Primary  ? Relevant Orders  ? GI pathogen panel by PCR, stool  ? Sedimentation rate  ? C-reactive protein  ? Comprehensive metabolic panel  ? CBC with Differential/Platelet  ? Magnesium  ? Stool culture  ? ?  ? ? ?I spent 30 minutes dedicated to the care of this patient on the date of this encounter to include pre-visit review of patient's medical history,  most recent imaging studies, Face-to-face time with the patient , and post visit ordering of testing and therapeutics.   ? ?Follow-up: No follow-ups on file. ? ? ?Crecencio Mc, MD ?

## 2021-08-25 NOTE — Assessment & Plan Note (Signed)
PRESUMED,  WITH STRAWBERRIES SUSPECTED.  STOOL CULTURE AND GI PATHOGEN ORDERED  START CIPRO AFTER STOOL HAS BEEN COLLECTED BLAND DIET OUTLINED UNTIL STOOLS SOLIDIFY ?

## 2021-08-25 NOTE — Addendum Note (Signed)
Addended by: Leeanne Rio on: 08/25/2021 08:45 AM ? ? Modules accepted: Orders ? ?

## 2021-08-26 ENCOUNTER — Encounter: Payer: Self-pay | Admitting: Internal Medicine

## 2021-08-26 LAB — CBC WITH DIFFERENTIAL/PLATELET
Basophils Absolute: 0 10*3/uL (ref 0.0–0.2)
Basos: 1 %
EOS (ABSOLUTE): 0.2 10*3/uL (ref 0.0–0.4)
Eos: 4 %
Hematocrit: 42.2 % (ref 34.0–46.6)
Hemoglobin: 14.8 g/dL (ref 11.1–15.9)
Immature Grans (Abs): 0 10*3/uL (ref 0.0–0.1)
Immature Granulocytes: 0 %
Lymphocytes Absolute: 2.1 10*3/uL (ref 0.7–3.1)
Lymphs: 46 %
MCH: 31.1 pg (ref 26.6–33.0)
MCHC: 35.1 g/dL (ref 31.5–35.7)
MCV: 89 fL (ref 79–97)
Monocytes Absolute: 0.5 10*3/uL (ref 0.1–0.9)
Monocytes: 11 %
Neutrophils Absolute: 1.8 10*3/uL (ref 1.4–7.0)
Neutrophils: 38 %
Platelets: 245 10*3/uL (ref 150–450)
RBC: 4.76 x10E6/uL (ref 3.77–5.28)
RDW: 12.6 % (ref 11.7–15.4)
WBC: 4.6 10*3/uL (ref 3.4–10.8)

## 2021-08-26 LAB — COMPREHENSIVE METABOLIC PANEL
ALT: 34 IU/L — ABNORMAL HIGH (ref 0–32)
AST: 25 IU/L (ref 0–40)
Albumin/Globulin Ratio: 2.1 (ref 1.2–2.2)
Albumin: 4.6 g/dL (ref 3.8–4.9)
Alkaline Phosphatase: 57 IU/L (ref 44–121)
BUN/Creatinine Ratio: 20 (ref 9–23)
BUN: 15 mg/dL (ref 6–24)
Bilirubin Total: 0.4 mg/dL (ref 0.0–1.2)
CO2: 24 mmol/L (ref 20–29)
Calcium: 9.1 mg/dL (ref 8.7–10.2)
Chloride: 103 mmol/L (ref 96–106)
Creatinine, Ser: 0.76 mg/dL (ref 0.57–1.00)
Globulin, Total: 2.2 g/dL (ref 1.5–4.5)
Glucose: 70 mg/dL (ref 70–99)
Potassium: 4.4 mmol/L (ref 3.5–5.2)
Sodium: 139 mmol/L (ref 134–144)
Total Protein: 6.8 g/dL (ref 6.0–8.5)
eGFR: 91 mL/min/{1.73_m2} (ref >59)

## 2021-08-26 LAB — SEDIMENTATION RATE: Sed Rate: 3 mm/hr (ref 0–40)

## 2021-08-26 LAB — MAGNESIUM: Magnesium: 2.1 mg/dL (ref 1.6–2.3)

## 2021-08-26 LAB — C-REACTIVE PROTEIN: CRP: <1 mg/L (ref 0–10)

## 2021-08-29 LAB — STOOL CULTURE: E coli, Shiga toxin Assay: NEGATIVE

## 2021-08-29 LAB — STOOL CULTURE REFLEX - RSASHR

## 2021-08-29 LAB — STOOL CULTURE REFLEX - CMPCXR

## 2021-09-07 ENCOUNTER — Encounter: Payer: Self-pay | Admitting: Internal Medicine

## 2021-09-07 DIAGNOSIS — R1010 Upper abdominal pain, unspecified: Secondary | ICD-10-CM

## 2021-09-11 ENCOUNTER — Telehealth: Payer: Self-pay

## 2021-09-11 NOTE — Telephone Encounter (Signed)
Scheduled for 10/25/2021 ?

## 2021-10-25 ENCOUNTER — Ambulatory Visit (INDEPENDENT_AMBULATORY_CARE_PROVIDER_SITE_OTHER): Payer: Managed Care, Other (non HMO) | Admitting: Gastroenterology

## 2021-10-25 ENCOUNTER — Encounter: Payer: Self-pay | Admitting: Gastroenterology

## 2021-10-25 VITALS — BP 116/75 | HR 71 | Temp 98.1°F | Ht 63.0 in | Wt 119.0 lb

## 2021-10-25 DIAGNOSIS — R109 Unspecified abdominal pain: Secondary | ICD-10-CM

## 2021-10-25 MED ORDER — DICYCLOMINE HCL 10 MG PO CAPS: 10.0000 mg | ORAL_CAPSULE | Freq: Three times a day (TID) | ORAL | 5 refills | Status: DC

## 2021-10-25 NOTE — Progress Notes (Signed)
? ? ?Primary Care Physician: Crecencio Mc, MD ? ?Primary Gastroenterologist:  Dr. Lucilla Lame ? ?Chief Complaint  ?Patient presents with  ? Abdominal Pain  ?  RUQ intermittent sharp pain... LLQ pain--worse with bowel movements... Having BM every other day... denies blood in stools or N/V  ? ? ?HPI: Cynthia Houston is a 56 y.o. female here with a history of seeing me in the past for abnormal liver enzymes and had a colonoscopy by me in 2021 with an adenomatous polyp removed and a repeat colonoscopy recommended in 5 years.  The patient was seen by her primary care provider back in the beginning of March for loose stools that had started just before her office visit with her PCP.  She had reported that the loose stools were associated with urgency a episode of fecal incontinence and cramping.  At that time she was having 3-4 bowel movements a day and it also occurred in the middle the night and woke her up.  The patient had changed to a bland diet without any improvement in her symptoms.  She had stool studies sent off that were negative for any GI pathogen tested.  The patient's ESR and CRP were both normal.  The patient has had a chronically elevated ALT with her most recent level being 34 with the upper limit of normal being 32.  The patient's white cell count and the rest of her CBC were normal. ?When the stools were found to be normal the patient's antibiotics were stopped and the patient was recommended to start a probiotic by her PCP. ?The patient reports that her diarrhea has completely stopped but she is now having cramps.  The cramps are better after she moves her bowels.  There is no report of any unexplained weight loss black stools or bloody stools.  She states that the abdominal pain is in multiple areas of her abdomen not located in any particular area although it is more intense in the left side of the abdomen. ? ? ?Past Medical History:  ?Diagnosis Date  ? Chicken pox   ? Depression   ? Diffuse  cystic mastopathy 2013  ? Family history of adverse reaction to anesthesia   ? Mother - PONV  ? Headache(784.0)   ? Hx of dysplastic nevus 03/07/2007  ? right lateral thigh, slight to mod  ? Osteoarthritis   ? right hand  ? PONV (postoperative nausea and vomiting)   ? Tendinitis   ? right elbow  ? Urinary tract infection   ? ? ?Current Outpatient Medications  ?Medication Sig Dispense Refill  ? ALPRAZolam (XANAX) 0.25 MG tablet Take 1 tablet (0.25 mg total) by mouth daily as needed for anxiety. 20 tablet 0  ? dicyclomine (BENTYL) 10 MG capsule Take 1 capsule (10 mg total) by mouth 3 (three) times daily before meals. 120 capsule 5  ? sertraline (ZOLOFT) 50 MG tablet TAKE ONE TABLET BY MOUTH EVERY DAY (Patient taking differently: Take 25 mg by mouth daily.) 90 tablet 1  ? ?No current facility-administered medications for this visit.  ? ? ?Allergies as of 10/25/2021 - Review Complete 10/25/2021  ?Allergen Reaction Noted  ? Sulfa antibiotics Rash   ? Fiorinal [butalbital-aspirin-caffeine] Nausea And Vomiting 07/08/2019  ? Percocet [oxycodone-acetaminophen] Nausea And Vomiting 05/05/2012  ? Augmentin [amoxicillin-pot clavulanate] Rash 05/05/2012  ? Doxycycline Rash 07/19/2015  ? ? ?ROS: ? ?General: Negative for anorexia, weight loss, fever, chills, fatigue, weakness. ?ENT: Negative for hoarseness, difficulty swallowing , nasal congestion. ?CV:  Negative for chest pain, angina, palpitations, dyspnea on exertion, peripheral edema.  ?Respiratory: Negative for dyspnea at rest, dyspnea on exertion, cough, sputum, wheezing.  ?GI: See history of present illness. ?GU:  Negative for dysuria, hematuria, urinary incontinence, urinary frequency, nocturnal urination.  ?Endo: Negative for unusual weight change.  ?  ?Physical Examination: ? ? BP 116/75   Pulse 71   Temp 98.1 ?F (36.7 ?C) (Oral)   Ht 5' 3" (1.6 m)   Wt 119 lb (54 kg)   BMI 21.08 kg/m?  ? ?General: Well-nourished, well-developed in no acute distress.  ?Eyes: No  icterus. Conjunctivae pink. ?Lungs: Clear to auscultation bilaterally. Non-labored. ?Heart: Regular rate and rhythm, no murmurs rubs or gallops.  ?Abdomen: Bowel sounds are normal, nontender, nondistended, no hepatosplenomegaly or masses, no abdominal bruits or hernia , no rebound or guarding.   ?Extremities: No lower extremity edema. No clubbing or deformities. ?Neuro: Alert and oriented x 3.  Grossly intact. ?Skin: Warm and dry, no jaundice.   ?Psych: Alert and cooperative, normal mood and affect. ? ?Labs:  ?  ?Imaging Studies: ?No results found. ? ?Assessment and Plan:  ? ?Cynthia Houston is a 56 y.o. y/o female who comes in today with a history of having a diarrheal illness that has resolved.  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 The patient has no way symptoms such as rectal bleeding or unexplained weight loss.  She has been told the pathophysiology of postinfectious irritable bowel syndrome and will be started on dicyclomine 10 mg 3 times a day and has been encouraged to start a probiotic.  She will let me know if these do not help her symptoms.  The patient has been with the plan and agrees with it. ? ? ? ? ?Lucilla Lame, MD. [facility] ? ? ? Note: This dictation was prepared with Dragon dictation along with smaller phrase technology. Any transcriptional errors that result from this process are unintentional.  ?

## 2021-10-31 ENCOUNTER — Ambulatory Visit: Payer: Managed Care, Other (non HMO) | Admitting: Dermatology

## 2021-10-31 DIAGNOSIS — D225 Melanocytic nevi of trunk: Secondary | ICD-10-CM | POA: Diagnosis not present

## 2021-10-31 DIAGNOSIS — Z1283 Encounter for screening for malignant neoplasm of skin: Secondary | ICD-10-CM | POA: Diagnosis not present

## 2021-10-31 DIAGNOSIS — L578 Other skin changes due to chronic exposure to nonionizing radiation: Secondary | ICD-10-CM | POA: Diagnosis not present

## 2021-10-31 DIAGNOSIS — L814 Other melanin hyperpigmentation: Secondary | ICD-10-CM

## 2021-10-31 DIAGNOSIS — L821 Other seborrheic keratosis: Secondary | ICD-10-CM

## 2021-10-31 DIAGNOSIS — L738 Other specified follicular disorders: Secondary | ICD-10-CM

## 2021-10-31 DIAGNOSIS — Z86018 Personal history of other benign neoplasm: Secondary | ICD-10-CM

## 2021-10-31 DIAGNOSIS — D229 Melanocytic nevi, unspecified: Secondary | ICD-10-CM

## 2021-10-31 DIAGNOSIS — D485 Neoplasm of uncertain behavior of skin: Secondary | ICD-10-CM

## 2021-10-31 DIAGNOSIS — L918 Other hypertrophic disorders of the skin: Secondary | ICD-10-CM

## 2021-10-31 NOTE — Progress Notes (Signed)
? ?Follow-Up Visit ?  ?Subjective  ?Cynthia Houston is a 56 y.o. female who presents for the following: Annual Exam. ? ?The patient presents for Total-Body Skin Exam (TBSE) for skin cancer screening and mole check.  The patient has spots, moles and lesions to be evaluated, some may be new or changing. She has a history of dysplastic nevus of the right lateral thigh. No history of skin cancer. She has couple of moles that get irritated. ?  ?The following portions of the chart were reviewed this encounter and updated as appropriate:  ?  ?  ? ?Review of Systems:  No other skin or systemic complaints except as noted in HPI or Assessment and Plan. ? ?Objective  ?Well appearing patient in no apparent distress; mood and affect are within normal limits. ? ?A full examination was performed including scalp, head, eyes, ears, nose, lips, neck, chest, axillae, abdomen, back, buttocks, bilateral upper extremities, bilateral lower extremities, hands, feet, fingers, toes, fingernails, and toenails. All findings within normal limits unless otherwise noted below. ? ?Right Lateral Breast ?10 x 7 mm waxy pink tan papule ?  ? ?Right Anterior Axilla ?4.0 mm flesh papule ? ? ? ?Left Chest ?4.0 mm flesh papule ? ? ? ?Assessment & Plan  ?Skin cancer screening performed today. ? ?Actinic Damage ?- chronic, secondary to cumulative UV radiation exposure/sun exposure over time ?- diffuse scaly erythematous macules with underlying dyspigmentation ?- Recommend daily broad spectrum sunscreen SPF 30+ to sun-exposed areas, reapply every 2 hours as needed.  ?- Recommend staying in the shade or wearing long sleeves, sun glasses (UVA+UVB protection) and wide brim hats (4-inch brim around the entire circumference of the hat). ?- Call for new or changing lesions. ? ?Lentigines ?- Scattered tan macules ?- Due to sun exposure ?- Benign-appering, observe ?- Recommend daily broad spectrum sunscreen SPF 30+ to sun-exposed areas, reapply every 2 hours as  needed. ?- Call for any changes ? ?Seborrheic Keratoses ?- Stuck-on, waxy, tan-brown papules and/or plaques  ?- Benign-appearing ?- Discussed benign etiology and prognosis. ?- Observe ?- Call for any changes ? ?Melanocytic Nevi ?- Tan-brown and/or pink-flesh-colored symmetric macules and papules, including right lateral knee and flesh papule on the right post upper arm ?- Benign appearing on exam today ?- Observation ?- Call clinic for new or changing moles ?- Recommend daily use of broad spectrum spf 30+ sunscreen to sun-exposed areas.  ? ?Acrochordons (Skin Tags) ?- Fleshy, skin-colored pedunculated papules of the axilla ?- Benign appearing.  ?- Observe. ?- If desired, they can be removed with an in office procedure that is not covered by insurance. ?- Please call the clinic if you notice any new or changing lesions. ? ?Sebaceous Hyperplasia ?- Small yellow papules with a central dell, including right paranasal ?- Benign ?- Observe ? ?History of Dysplastic Nevus ?- No evidence of recurrence today of the right lateral thigh ?- Recommend regular full body skin exams ?- Recommend daily broad spectrum sunscreen SPF 30+ to sun-exposed areas, reapply every 2 hours as needed.  ?- Call if any new or changing lesions are noted between office visits ? ? ?Seborrheic keratosis ?Right Lateral Breast ? ?Slightly bigger from previous visit. Reassured benign age-related growth.  Recommend observation.  Discussed cryotherapy if spot(s) become irritated or inflamed. ? ? ? ?Neoplasm of uncertain behavior of skin (2) ?Right Anterior Axilla ? ?Epidermal / dermal shaving ? ?Lesion diameter (cm):  0.4 ?Informed consent: discussed and consent obtained   ?Patient was prepped and draped  in usual sterile fashion: Area prepped with alcohol. ?Anesthesia: the lesion was anesthetized in a standard fashion   ?Anesthetic:  1% lidocaine w/ epinephrine 1-100,000 buffered w/ 8.4% NaHCO3 ?Instrument used: flexible razor blade   ?Hemostasis achieved  with: pressure, aluminum chloride and electrodesiccation   ?Outcome: patient tolerated procedure well   ?Post-procedure details: wound care instructions given   ?Post-procedure details comment:  Ointment and small bandage applied ? ?Anatomic Pathology Report ? ?Left Chest ? ?Epidermal / dermal shaving ? ?Lesion diameter (cm):  0.4 ?Informed consent: discussed and consent obtained   ?Patient was prepped and draped in usual sterile fashion: Area prepped with alcohol. ?Anesthesia: the lesion was anesthetized in a standard fashion   ?Anesthetic:  1% lidocaine w/ epinephrine 1-100,000 buffered w/ 8.4% NaHCO3 ?Instrument used: flexible razor blade   ?Hemostasis achieved with: pressure, aluminum chloride and electrodesiccation   ?Outcome: patient tolerated procedure well   ?Post-procedure details: wound care instructions given   ?Post-procedure details comment:  Ointment and small bandage applied ? ?Anatomic Pathology Report ? ?Biopsies sent to [facility]. ? ? ?Return in about 1 year (around 11/01/2022) for TBSE. ? ?I, Jamesetta Orleans, CMA, am acting as scribe for Brendolyn Patty, MD . ?Documentation: I have reviewed the above documentation for accuracy and completeness, and I agree with the above. ? ?Brendolyn Patty MD  ? ? ?

## 2021-10-31 NOTE — Patient Instructions (Addendum)

## 2021-11-02 LAB — ANATOMIC PATHOLOGY REPORT

## 2021-11-06 ENCOUNTER — Telehealth: Payer: Self-pay

## 2021-11-06 NOTE — Telephone Encounter (Signed)
-----   Message from Brendolyn Patty, MD sent at 11/03/2021  2:31 PM EDT ----- ?Specimen A (R axilla) and B (L chest), both are benign moles  ? ?- please call patient ?

## 2021-11-06 NOTE — Telephone Encounter (Signed)
Advised patient both biopsies were benign moles. No further treatment needed.  ?

## 2022-01-16 ENCOUNTER — Encounter: Payer: Self-pay | Admitting: Internal Medicine

## 2022-01-16 ENCOUNTER — Ambulatory Visit (INDEPENDENT_AMBULATORY_CARE_PROVIDER_SITE_OTHER): Payer: Managed Care, Other (non HMO) | Admitting: Internal Medicine

## 2022-01-16 VITALS — BP 110/62 | HR 67 | Temp 98.2°F | Ht 63.0 in | Wt 116.6 lb

## 2022-01-16 DIAGNOSIS — Z0000 Encounter for general adult medical examination without abnormal findings: Secondary | ICD-10-CM | POA: Diagnosis not present

## 2022-01-16 DIAGNOSIS — M79605 Pain in left leg: Secondary | ICD-10-CM | POA: Diagnosis not present

## 2022-01-16 DIAGNOSIS — Z1231 Encounter for screening mammogram for malignant neoplasm of breast: Secondary | ICD-10-CM

## 2022-01-16 DIAGNOSIS — R7301 Impaired fasting glucose: Secondary | ICD-10-CM | POA: Diagnosis not present

## 2022-01-16 DIAGNOSIS — M79604 Pain in right leg: Secondary | ICD-10-CM

## 2022-01-16 DIAGNOSIS — F411 Generalized anxiety disorder: Secondary | ICD-10-CM

## 2022-01-16 DIAGNOSIS — E785 Hyperlipidemia, unspecified: Secondary | ICD-10-CM

## 2022-01-16 DIAGNOSIS — R5383 Other fatigue: Secondary | ICD-10-CM

## 2022-01-16 DIAGNOSIS — G2581 Restless legs syndrome: Secondary | ICD-10-CM

## 2022-01-16 DIAGNOSIS — R7401 Elevation of levels of liver transaminase levels: Secondary | ICD-10-CM

## 2022-01-16 DIAGNOSIS — E559 Vitamin D deficiency, unspecified: Secondary | ICD-10-CM

## 2022-01-16 DIAGNOSIS — M79606 Pain in leg, unspecified: Secondary | ICD-10-CM | POA: Insufficient documentation

## 2022-01-16 DIAGNOSIS — H6122 Impacted cerumen, left ear: Secondary | ICD-10-CM

## 2022-01-16 MED ORDER — ZOSTER VAC RECOMB ADJUVANTED 50 MCG/0.5ML IM SUSR: 0.5000 mL | Freq: Once | INTRAMUSCULAR | 1 refills | Status: AC

## 2022-01-16 MED ORDER — SERTRALINE HCL 50 MG PO TABS
50.0000 mg | ORAL_TABLET | Freq: Every day | ORAL | 1 refills | Status: DC
Start: 2022-01-16 — End: 2022-07-09

## 2022-01-16 NOTE — Progress Notes (Addendum)
The patient is here for annual preventive examination and management of other chronic and acute problems.   The risk factors are reflected in the social history.   The roster of all physicians providing medical care to patient - is listed in the Snapshot section of the chart.   Activities of daily living:  The patient is 100% independent in all ADLs: dressing, toileting, feeding as well as independent mobility   Home safety : The patient has smoke detectors in the home. They wear seatbelts.  There are no unsecured firearms at home. There is no violence in the home.    There is no risks for hepatitis, STDs or HIV. There is no   history of blood transfusion. They have no travel history to infectious disease endemic areas of the world.   The patient has seen their dentist in the last six month. They have seen their eye doctor in the last year. The patinet  denies slight hearing difficulty with regard to whispered voices and some television programs.  They have deferred audiologic testing in the last year.  They do not  have excessive sun exposure. Discussed the need for sun protection: hats, long sleeves and use of sunscreen if there is significant sun exposure.    Diet: the importance of a healthy diet is discussed. They do have a healthy diet.   The benefits of regular aerobic exercise were discussed. The patient  exercises   in the morning for 30 minutes.    Depression screen: there are no signs or vegative symptoms of depression- irritability, change in appetite, anhedonia, sadness/tearfullness.   The following portions of the patient's history were reviewed and updated as appropriate: allergies, current medications, past family history, past medical history,  past surgical history, past social history  and problem list.   Visual acuity was not assessed per patient preference since the patient has regular follow up with an  ophthalmologist. Hearing and body mass index were assessed and  reviewed.    During the course of the visit the patient was educated and counseled about appropriate screening and preventive services including : fall prevention , diabetes screening, nutrition counseling, colorectal cancer screening, and recommended immunizations.    Chief Complaint:  Caregiver stress: she has been more irritable,  per husband ,  due to increased stress of managing her mother's finances, round the clock caregivers,  working full time.  She has resumed sertraline   Bilateral  leg pain described as aching from hip to ankle.  Started when she resumed aerobic workouts.  Legs feel restless at night.  Disrupting her sleep    Review of Symptoms  Patient denies headache, fevers, malaise, unintentional weight loss, skin rash, eye pain, sinus congestion and sinus pain, sore throat, dysphagia,  hemoptysis , cough, dyspnea, wheezing, chest pain, palpitations, orthopnea, edema, abdominal pain, nausea, melena, diarrhea, constipation, flank pain, dysuria, hematuria, urinary  Frequency, nocturia, numbness, tingling, seizures,  Focal weakness, Loss of consciousness,  Tremor, interrupted sleep, depression, , and suicidal ideation.    Physical Exam:  BP 110/62 (BP Location: Left Arm, Patient Position: Sitting, Cuff Size: Normal)   Pulse 67   Temp 98.2 F (36.8 C) (Oral)   Ht '5\' 3"'$  (1.6 m)   Wt 116 lb 9.6 oz (52.9 kg)   SpO2 97%   BMI 20.65 kg/m    General appearance: alert, cooperative and appears stated age Head: Normocephalic, without obvious abnormality, atraumatic Eyes: conjunctivae/corneas clear. PERRL, EOM's intact. Fundi benign. Ears: normal TM's ,  left  ear canal partially occluded by cerumen  Nose: Nares normal. Septum midline. Mucosa normal. No drainage or sinus tenderness. Throat: lips, mucosa, and tongue normal; teeth and gums normal Neck: no adenopathy, no carotid bruit, no JVD, supple, symmetrical, trachea midline and thyroid not enlarged, symmetric, no  tenderness/mass/nodules Lungs: clear to auscultation bilaterally Breasts: normal appearance, no masses or tenderness Heart: regular rate and rhythm, S1, S2 normal, no murmur, click, rub or gallop Abdomen: soft, non-tender; bowel sounds normal; no masses,  no organomegaly Extremities: extremities normal, atraumatic, no cyanosis or edema Pulses: 2+ and symmetric Skin: Skin color, texture, turgor normal. No rashes or lesions Neurologic: Alert and oriented X 3, normal strength and tone. Normal symmetric reflexes. Normal coordination and gait.     Assessment and Plan:  Generalized anxiety disorder Secondary to increased responsibilities caring for mother.  Increased sertraline to 50 mg daily   Cerumen impaction Recurrent,  Left ear only.  Using Debrox regularly.  Needs RN visit for irrigation   Encounter for preventive health examination age appropriate education and counseling updated, referrals for preventative services and immunizations addressed, dietary and smoking counseling addressed, most recent labs reviewed.  I have personally reviewed and have noted:   1) the patient's medical and social history 2) The pt's use of alcohol, tobacco, and illicit drugs 3) The patient's current medications and supplements 4) Functional ability including ADL's, fall risk, home safety risk, hearing and visual impairment 5) Diet and physical activities 6) Evidence for depression or mood disorder 7) The patient's height, weight, and BMI have been recorded in the chart     I have made referrals, and provided counseling and education based on review of the above  Elevated ALT measurement Rechecking today . Prior noninvasive workup negative   Restless legs Ruling out iron deficiency.  Will start medication as indicated   Leg pain Not suggestive of lumbar radiculopathy.  Will rule out vitamin d deficinecy   Updated Medication List Outpatient Encounter Medications as of 01/16/2022  Medication Sig    ALPRAZolam (XANAX) 0.25 MG tablet Take 1 tablet (0.25 mg total) by mouth daily as needed for anxiety.   [EXPIRED] Zoster Vaccine Adjuvanted [facility]) injection Inject 0.5 mLs into the muscle once for 1 dose.   [DISCONTINUED] sertraline (ZOLOFT) 50 MG tablet TAKE ONE TABLET BY MOUTH EVERY DAY (Patient taking differently: Take 25 mg by mouth daily.)   sertraline (ZOLOFT) 50 MG tablet Take 1 tablet (50 mg total) by mouth daily.   [DISCONTINUED] dicyclomine (BENTYL) 10 MG capsule Take 1 capsule (10 mg total) by mouth 3 (three) times daily before meals. (Patient not taking: Reported on 01/16/2022)   No facility-administered encounter medications on file as of 01/16/2022.

## 2022-01-16 NOTE — Assessment & Plan Note (Signed)
Ruling out iron deficiency.  Will start medication as indicated

## 2022-01-16 NOTE — Assessment & Plan Note (Signed)
Not suggestive of lumbar radiculopathy.  Will rule out vitamin d deficinecy

## 2022-01-16 NOTE — Assessment & Plan Note (Signed)

## 2022-01-16 NOTE — Assessment & Plan Note (Addendum)
Secondary to increased responsibilities caring for mother.  Increased sertraline to 50 mg daily

## 2022-01-16 NOTE — Assessment & Plan Note (Addendum)
Recurrent,  Left ear only.  Using Debrox regularly.  Needs RN visit for irrigation

## 2022-01-16 NOTE — Patient Instructions (Signed)
Please schedule an RN visit to clean out your left ear  Your annual mammogram has been ordered.  Please call [facility] to call to make your appointments  .  The phone number for [facility] is  [phone]

## 2022-01-16 NOTE — Assessment & Plan Note (Signed)
Rechecking today . Prior noninvasive workup negative

## 2022-01-18 ENCOUNTER — Ambulatory Visit: Payer: Managed Care, Other (non HMO)

## 2022-01-18 NOTE — Progress Notes (Addendum)
Patient came in for a left ear irrigation and the wax was impacted and hard and no results came from the irrigation, Patient may was informed that she may need a prescription for drops to soften the wax and she understood.  Patient had no pain and no symptoms from the irrigation.  Cynthia Houston,cma

## 2022-01-19 ENCOUNTER — Encounter: Payer: Self-pay | Admitting: Internal Medicine

## 2022-01-25 ENCOUNTER — Other Ambulatory Visit: Payer: Self-pay | Admitting: Internal Medicine

## 2022-01-26 LAB — COMPREHENSIVE METABOLIC PANEL
ALT: 39 IU/L — ABNORMAL HIGH (ref 0–32)
AST: 24 IU/L (ref 0–40)
Albumin/Globulin Ratio: 2 (ref 1.2–2.2)
Albumin: 4.5 g/dL (ref 3.8–4.9)
Alkaline Phosphatase: 57 IU/L (ref 44–121)
BUN/Creatinine Ratio: 19 (ref 9–23)
BUN: 17 mg/dL (ref 6–24)
Bilirubin Total: 0.3 mg/dL (ref 0.0–1.2)
CO2: 23 mmol/L (ref 20–29)
Calcium: 9.1 mg/dL (ref 8.7–10.2)
Chloride: 102 mmol/L (ref 96–106)
Creatinine, Ser: 0.88 mg/dL (ref 0.57–1.00)
Globulin, Total: 2.3 g/dL (ref 1.5–4.5)
Glucose: 96 mg/dL (ref 70–99)
Potassium: 4.7 mmol/L (ref 3.5–5.2)
Sodium: 139 mmol/L (ref 134–144)
Total Protein: 6.8 g/dL (ref 6.0–8.5)
eGFR: 76 mL/min/{1.73_m2} (ref >59)

## 2022-01-26 LAB — CBC WITH DIFFERENTIAL/PLATELET
Basophils Absolute: 0 10*3/uL (ref 0.0–0.2)
Basos: 1 %
EOS (ABSOLUTE): 0.2 10*3/uL (ref 0.0–0.4)
Eos: 5 %
Hematocrit: 42.8 % (ref 34.0–46.6)
Hemoglobin: 14 g/dL (ref 11.1–15.9)
Immature Grans (Abs): 0 10*3/uL (ref 0.0–0.1)
Immature Granulocytes: 0 %
Lymphocytes Absolute: 1.6 10*3/uL (ref 0.7–3.1)
Lymphs: 41 %
MCH: 29.2 pg (ref 26.6–33.0)
MCHC: 32.7 g/dL (ref 31.5–35.7)
MCV: 89 fL (ref 79–97)
Monocytes Absolute: 0.4 10*3/uL (ref 0.1–0.9)
Monocytes: 9 %
Neutrophils Absolute: 1.7 10*3/uL (ref 1.4–7.0)
Neutrophils: 44 %
Platelets: 244 10*3/uL (ref 150–450)
RBC: 4.79 x10E6/uL (ref 3.77–5.28)
RDW: 12.3 % (ref 11.7–15.4)
WBC: 3.9 10*3/uL (ref 3.4–10.8)

## 2022-01-26 LAB — LIPID PANEL
Chol/HDL Ratio: 2.3 ratio (ref 0.0–4.4)
Cholesterol, Total: 196 mg/dL (ref 100–199)
HDL: 84 mg/dL (ref >39)
LDL Chol Calc (NIH): 102 mg/dL — ABNORMAL HIGH (ref 0–99)
Triglycerides: 52 mg/dL (ref 0–149)
VLDL Cholesterol Cal: 10 mg/dL (ref 5–40)

## 2022-01-26 LAB — VITAMIN D 25 HYDROXY (VIT D DEFICIENCY, FRACTURES): Vit D, 25-Hydroxy: 43 ng/mL (ref 30.0–100.0)

## 2022-01-26 LAB — HEMOGLOBIN A1C
Est. average glucose Bld gHb Est-mCnc: 117 mg/dL
Hgb A1c MFr Bld: 5.7 % — ABNORMAL HIGH (ref 4.8–5.6)

## 2022-01-26 LAB — IRON AND TIBC
Iron Saturation: 19 % (ref 15–55)
Iron: 51 ug/dL (ref 27–159)
Total Iron Binding Capacity: 269 ug/dL (ref 250–450)
UIBC: 218 ug/dL (ref 131–425)

## 2022-01-26 LAB — LDL CHOLESTEROL, DIRECT: LDL Direct: 106 mg/dL — ABNORMAL HIGH (ref 0–99)

## 2022-01-26 LAB — FERRITIN: Ferritin: 77 ng/mL (ref 15–150)

## 2022-01-26 LAB — TSH: TSH: 1.31 u[IU]/mL (ref 0.450–4.500)

## 2022-01-31 ENCOUNTER — Encounter: Payer: Self-pay | Admitting: Internal Medicine

## 2022-01-31 NOTE — Telephone Encounter (Signed)
Placed in quick sign folder for signature.  

## 2022-02-05 ENCOUNTER — Ambulatory Visit (INDEPENDENT_AMBULATORY_CARE_PROVIDER_SITE_OTHER): Payer: Managed Care, Other (non HMO)

## 2022-02-05 DIAGNOSIS — H6122 Impacted cerumen, left ear: Secondary | ICD-10-CM | POA: Diagnosis not present

## 2022-02-05 NOTE — Progress Notes (Signed)
Patient came in today for left ear irrigation. Patient has been using the debrox drops & ear wax had loosened. Also appeared that some was cleared out of the ear. I did attempted to flush the ear with peroxide & warm water. Patient expressed some tenderness when pumping water so I stopped the process of flushing. I did scrape what ear wax that was seen & was minimal. I could see only small amount of ear wax around the outer part of inner ear canal & patient stated that she would prefer to leave it. We decided that ear irrigation was complete & she stated that she would use debrox in the future if she felt that was was once again building back up.

## 2022-02-07 ENCOUNTER — Encounter: Payer: Self-pay | Admitting: Internal Medicine

## 2022-04-26 ENCOUNTER — Ambulatory Visit
Admission: RE | Admit: 2022-04-26 | Discharge: 2022-04-26 | Disposition: A | Discharge status: Home / Self Care | Payer: Managed Care, Other (non HMO) | Source: Ambulatory Visit | Attending: Internal Medicine | Admitting: Internal Medicine

## 2022-04-26 DIAGNOSIS — Z1231 Encounter for screening mammogram for malignant neoplasm of breast: Secondary | ICD-10-CM | POA: Insufficient documentation

## 2022-07-06 ENCOUNTER — Other Ambulatory Visit: Payer: Self-pay | Admitting: Internal Medicine

## 2022-07-15 ENCOUNTER — Ambulatory Visit
Admission: EM | Admit: 2022-07-15 | Discharge: 2022-07-15 | Disposition: A | Discharge status: Home / Self Care | Payer: Managed Care, Other (non HMO) | Attending: Urgent Care | Admitting: Urgent Care

## 2022-07-15 DIAGNOSIS — J22 Unspecified acute lower respiratory infection: Secondary | ICD-10-CM

## 2022-07-15 DIAGNOSIS — J069 Acute upper respiratory infection, unspecified: Secondary | ICD-10-CM | POA: Diagnosis not present

## 2022-07-15 MED ORDER — PROMETHAZINE-DM 6.25-15 MG/5ML PO SYRP: 5.0000 mL | ORAL_SOLUTION | Freq: Four times a day (QID) | ORAL | 0 refills | Status: AC | PRN

## 2022-07-15 MED ORDER — AZITHROMYCIN 250 MG PO TABS: ORAL_TABLET | ORAL | 0 refills | Status: DC

## 2022-07-15 MED ORDER — BENZONATATE 100 MG PO CAPS: ORAL_CAPSULE | ORAL | 0 refills | Status: DC

## 2022-07-15 NOTE — ED Provider Notes (Signed)
[facility]    CSN: [id number] Arrival date & time: 07/15/22  0859      History   Chief Complaint Chief Complaint  Patient presents with   Cough   Nasal Congestion    HPI SURI TAFOLLA is a 57 y.o. female.    Cough   Patient presents to urgent care with complaint of symptoms x 5 days.  She endorses cough, congestion and sore throat.  Reports past fever, now resolved.  History of anxiety, treated with sertraline chronically and Xanax as needed.  Her chart indicates past treatment with prednisone though there was some question about intolerance raised by the patient on 07/01/2017.  She states she lost sense of taste and smell early in the process however she endorses significant nasal congestion.  Reports cough is hacking and causing pain in her chest when she coughs and breathes deeply.  She is using Mucinex DM which is somewhat effective.  Past Medical History:  Diagnosis Date   Chicken pox    Depression    Diffuse cystic mastopathy 2013   Family history of adverse reaction to anesthesia    Mother - PONV   Headache(784.0)    Hx of dysplastic nevus 03/07/2007   right lateral thigh, slight to mod   Osteoarthritis    right hand   PONV (postoperative nausea and vomiting)    Tendinitis    right elbow   Urinary tract infection     Patient Active Problem List   Diagnosis Date Noted   Restless legs 01/16/2022   Leg pain 01/16/2022   Paroxysmal tachycardia, unspecified ([place]) 07/25/2021   Atypical chest pain 07/25/2021   Eustachian tube disorder, bilateral 07/25/2021   Cerumen impaction 07/25/2021   Encounter for screening colonoscopy    Family history of colonic polyps    Polyp of colon    Personal history of COVID-19 12/21/2019   Hallux valgus, acquired, left 08/12/2019   History of COVID-19 08/06/2019   Generalized osteoarthritis of hand 05/04/2019   Joint pain in fingers of right hand 12/16/2018   Intermittent palpitations 12/15/2017   Cervical  disc disorder at C5-C6 level with radiculopathy 07/18/2017   Elevated ALT measurement 12/21/2016   Generalized anxiety disorder 01/03/2016   S/P abdominal supracervical subtotal hysterectomy 06/03/2014   Dyspareunia, female 05/07/2014   Encounter for preventive health examination 11/20/2012   Fibrocystic breast disease 09/24/2012   History of abnormal cervical Pap smear 05/07/2012   Neutropenia ([place]) 05/07/2012    Past Surgical History:  Procedure Laterality Date   ABDOMINAL HYSTERECTOMY  2008   supracervical    AIKEN OSTEOTOMY Left 07/16/2019   Procedure: PHALANX OSTEOTOMY AKIN;  Surgeon: Albertine Patricia, DPM;  Location: [facility];  Service: Podiatry;  Laterality: Left;   BREAST CYST ASPIRATION Right    neg   BUNIONECTOMY Right 2011   BUNIONECTOMY Left 07/16/2019   Procedure: LAPIDUS-TYPE;  Surgeon: Albertine Patricia, DPM;  Location: [facility];  Service: Podiatry;  Laterality: Left;  LMA WITH BLOCK   COLONOSCOPY  2008   Dr. Vira Agar   COLONOSCOPY WITH PROPOFOL N/A 05/24/2020   Procedure: COLONOSCOPY WITH PROPOFOL;  Surgeon: Lucilla Lame, MD;  [facility] ENDOSCOPY;  Service: Endoscopy;  Laterality: N/A;   DIAGNOSTIC LAPAROSCOPY     [place]   gynecological surgery   2004   TUBAL LIGATION     UTERINE FIBROID SURGERY  2002   x2 2004   WEIL OSTEOTOMY Left 07/16/2019   Procedure: WEIL OSTEOTOMY;  Surgeon: Albertine Patricia, DPM;  Location: [facility];  Service: Podiatry;  Laterality: Left;    OB History     Gravida  1   Para  1   Term      Preterm      AB      Living  1      SAB      IAB      Ectopic      Multiple      Live Births           Obstetric Comments  First pregnancy 51 First menstrual 10          Home Medications    Prior to Admission medications   Medication Sig Start Date End Date Taking? Authorizing Provider  ALPRAZolam (XANAX) 0.25 MG tablet Take 1 tablet (0.25 mg total) by mouth  daily as needed for anxiety. 07/25/21   Crecencio Mc, MD  sertraline (ZOLOFT) 50 MG tablet TAKE 1 TABLET BY MOUTH DAILY 07/09/22   Crecencio Mc, MD    Family History Family History  Problem Relation Age of Onset   Hyperlipidemia Mother    Hypertension Mother    Arthritis Mother        rheumatoid arthritis   Crohn's disease Father    Heart disease Father 26       CABG   Hyperlipidemia Father    Hypertension Father    Breast cancer Neg Hx     Social History Social History   Tobacco Use   Smoking status: Never   Smokeless tobacco: Never  Vaping Use   Vaping Use: Never used  Substance Use Topics   Alcohol use: Yes    Alcohol/week: 2.0 standard drinks of alcohol    Types: 2 Glasses of wine per week   Drug use: No     Allergies   Sulfa antibiotics, Fiorinal [butalbital-aspirin-caffeine], Percocet [oxycodone-acetaminophen], Augmentin [amoxicillin-pot clavulanate], and Doxycycline   Review of Systems Review of Systems  Respiratory:  Positive for cough.      Physical Exam Triage Vital Signs ED Triage Vitals  Enc Vitals Group     BP      Pulse      Resp      Temp      Temp src      SpO2      Weight      Height      Head Circumference      Peak Flow      Pain Score      Pain Loc      Pain Edu?      Excl. in [place]?    No data found.  Updated Vital Signs There were no vitals taken for this visit.  Visual Acuity Right Eye Distance:   Left Eye Distance:   Bilateral Distance:    Right Eye Near:   Left Eye Near:    Bilateral Near:     Physical Exam Vitals reviewed.  Constitutional:      Appearance: Normal appearance. She is ill-appearing.  Cardiovascular:     Rate and Rhythm: Normal rate and regular rhythm.     Pulses: Normal pulses.     Heart sounds: Normal heart sounds.  Pulmonary:     Effort: Pulmonary effort is normal.     Breath sounds: Normal breath sounds.  Skin:    General: Skin is warm and dry.  Neurological:     General: No focal  deficit present.  Mental Status: She is alert and oriented to person, place, and time.  Psychiatric:        Mood and Affect: Mood normal.        Behavior: Behavior normal.      UC Treatments / Results  Labs (all labs ordered are listed, but only abnormal results are displayed) Labs Reviewed - No data to display  EKG   Radiology No results found.  Procedures Procedures (including critical care time)  Medications Ordered in UC Medications - No data to display  Initial Impression / Assessment and Plan / UC Course  I have reviewed the triage vital signs and the nursing notes.  Pertinent labs & imaging results that were available during my care of the patient were reviewed by me and considered in my medical decision making (see chart for details).   Patient is afebrile here without recent antipyretics. Satting well on room air. Overall is ill appearing, well hydrated, without respiratory distress. Pulmonary exam is remarkable only for severe cough which is worsened by deep breathing.  Lungs CTAB without wheezing, rhonchi, rales.  Symptoms are consistent with a viral process including influenza or COVID.  Symptoms are only 29 days old so no current concern for secondary bacterial infection.  Would suggest corticosteroid, however patient's history of anxiety and possible intolerance reported in 2019 make me reluctant to do so.  Will prescribe azithromycin given it has some anti-inflammatory properties, recommending Sudafed (pseudoephedrine) and will prescribe benzonatate and promethazine DM to help calm the cough.  Final Clinical Impressions(s) / UC Diagnoses   Final diagnoses:  None   Discharge Instructions   None    ED Prescriptions   None    PDMP not reviewed this encounter.   Rose Phi, [place] 07/15/22 [phone]

## 2022-07-15 NOTE — Discharge Instructions (Addendum)
You have been diagnosed with a viral upper respiratory infection based on your symptoms and exam. Viral illnesses cannot be treated with antibiotics - they are self limiting - and you should find your symptoms resolving within a few days. Get plenty of rest and non-caffeinated fluids. Watch for signs of dehydration including reduced urine output and dark colored urine.  An antibiotic, azithromycin, has been provided because of its anti-inflammatory properties.  We recommend you use over-the-counter medications for symptom control including acetaminophen (Tylenol), ibuprofen (Advil/Motrin) or naproxen (Aleve) for throat pain, fever, chills or body aches. You may combine use of acetaminophen and ibuprofen/naproxen if needed.  Some patients find an pain-relieving throat spray such as Chloraseptic to be effective.  Also recommend age-appropriate cold/cough medication containing a cough suppressant such as dextromethorphan, as needed.  Cough medication, benzonatate which is nonsedating, and Promethazine DM, which is sedating has been prescribed.  Saline mist spray is helpful for removing excess mucus from your nose.  Room humidifiers are helpful to ease breathing at night. I recommend guaifenesin (Mucinex) with plenty of water throughout the day to help thin and loosen mucus secretions in your respiratory passages.   If appropriate based upon your other medical problems, you might also find relief of nasal/sinus congestion symptoms by using a nasal decongestant such as fluticasone (Flonase ) or pseudoephedrine (Sudafed sinus).  You will need to obtain Sudafed from behind the pharmacist counter.  Speak to the pharmacist to verify that you are not duplicating medications with other over-the-counter formulations that you may be using.

## 2022-07-15 NOTE — ED Triage Notes (Signed)
Pt. Presents to UC w/ a cough and congestion for the past 5 days. Pt. States symptoms started 5 days ago with a sore throat and progressively got worse. Pt. States she did have a fever that has now resolved.

## 2022-07-23 ENCOUNTER — Ambulatory Visit
Admission: RE | Admit: 2022-07-23 | Discharge: 2022-07-23 | Disposition: A | Discharge status: Home / Self Care | Payer: Managed Care, Other (non HMO) | Attending: Family | Admitting: Family

## 2022-07-23 ENCOUNTER — Ambulatory Visit
Admission: RE | Admit: 2022-07-23 | Discharge: 2022-07-23 | Disposition: A | Discharge status: Home / Self Care | Payer: Managed Care, Other (non HMO) | Source: Ambulatory Visit | Attending: Family | Admitting: Family

## 2022-07-23 ENCOUNTER — Encounter: Payer: Self-pay | Admitting: Family

## 2022-07-23 ENCOUNTER — Ambulatory Visit: Payer: Managed Care, Other (non HMO) | Admitting: Family

## 2022-07-23 VITALS — BP 118/78 | HR 71 | Temp 97.5°F | Ht 63.0 in | Wt 116.2 lb

## 2022-07-23 DIAGNOSIS — J40 Bronchitis, not specified as acute or chronic: Secondary | ICD-10-CM

## 2022-07-23 MED ORDER — BUDESONIDE-FORMOTEROL FUMARATE 80-4.5 MCG/ACT IN AERO: 2.0000 | INHALATION_SPRAY | Freq: Two times a day (BID) | RESPIRATORY_TRACT | 2 refills | Status: DC

## 2022-07-23 NOTE — Patient Instructions (Signed)
Trial of symbicort Chest xray  Please let me know how you are doing

## 2022-07-23 NOTE — Assessment & Plan Note (Addendum)
Reassuring exam and in no acute respiratory distress.  No adventitious lung sounds on my exam today. discussed with patient based on onset of loss of taste and smell, although not formally tested for COVID, I have suspicion that patient may have had COVID 14 days ago.  Treated with azithromycin without improvement of symptoms ; as we discussed today likely symptoms were viral in nature and azithromycin would not have been helpful at that point.  Cough has improved.  Patient is without fever.  I discussed with her most likely viral bronchitis at this time, although unfortunately cough is lingering.  Intolerant to oral prednisone, we will trial Symbicort with close vigilance for increase anxiety. She will continue Mucinex DM, Promethazine DM for cough at bedtime.  Pending chest x-ray.  She will let me know how she is doing.  Consider antibiotic therapy if no improvement of symptoms.

## 2022-07-23 NOTE — Progress Notes (Signed)
Assessment & Plan:  Bronchitis Assessment & Plan: Reassuring exam and in no acute respiratory distress.  No adventitious lung sounds on my exam today. discussed with patient based on onset of loss of taste and smell, although not formally tested for COVID, I have suspicion that patient may have had COVID 14 days ago.  Treated with azithromycin without improvement of symptoms ; as we discussed today likely symptoms were viral in nature and azithromycin would not have been helpful at that point.  Cough has improved.  Patient is without fever.  I discussed with her most likely viral bronchitis at this time, although unfortunately cough is lingering.  Intolerant to oral prednisone, we will trial Symbicort with close vigilance for increase anxiety. She will continue Mucinex DM, Promethazine DM for cough at bedtime.  Pending chest x-ray.  She will let me know how she is doing.  Consider antibiotic therapy if no improvement of symptoms.  Orders: -     Budesonide-Formoterol Fumarate; Inhale 2 puffs into the lungs 2 (two) times daily.  Dispense: 1 each; Refill: 2 -     DG Chest 2 View; Future     Return precautions given.   Risks, benefits, and alternatives of the medications and treatment plan prescribed today were discussed, and patient expressed understanding.   Education regarding symptom management and diagnosis given to patient on AVS either electronically or printed.  No follow-ups on file.  Cynthia Paris, FNP  Subjective:    Patient ID: Cynthia Houston, female    DOB: 10/27/1965, 57 y.o.   MRN: [id number]  CC: Cynthia Houston is a 57 y.o. female who presents today for an acute visit.    HPI: Complains of continued cough x 14 days Started with sore throat and then cough, loss of smell/taste.  Cough is better.  She continues to have changes in her taste everything taste metallic .   Chest feels 'tight' when she coughs. Endorses sinus pressure. No sore throat, wheezing, CP.   She  report tmax 99.9 on tylenol 2 weeks ago. She had chills at that time. Fever has resolved.   She isnt sure zpak was helpful for symptoms.     She has been on promethazine Dm with relief. Tessalon is not helpful. She is taking muxinex DM.   No h/o smoking , lung disease   H/o paroxysmal tachycardia  Oral prednisone has caused increased anxiety in the past  Seen 07/15/22 at [facility] and started on zpak.  Allergies: Sulfa antibiotics, Fiorinal [butalbital-aspirin-caffeine], Percocet [oxycodone-acetaminophen], Augmentin [amoxicillin-pot clavulanate], and Doxycycline Current Outpatient Medications on File Prior to Visit  Medication Sig Dispense Refill   ALPRAZolam (XANAX) 0.25 MG tablet Take 1 tablet (0.25 mg total) by mouth daily as needed for anxiety. 20 tablet 0   benzonatate (TESSALON) 100 MG capsule Take 1-2 tablets 3 times a day as needed for cough 30 capsule 0   sertraline (ZOLOFT) 50 MG tablet TAKE 1 TABLET BY MOUTH DAILY 90 tablet 1   azithromycin (ZITHROMAX Z-PAK) 250 MG tablet Take 2 tablets (500 mg) today, then 1 tablet (250 mg) for next 4 days. (Patient not taking: Reported on 07/23/2022) 6 tablet 0   No current facility-administered medications on file prior to visit.    Review of Systems  Constitutional:  Negative for chills and fever.  HENT:  Positive for congestion and sinus pressure. Negative for sore throat and trouble swallowing.   Respiratory:  Positive for cough and chest tightness. Negative for wheezing.  Cardiovascular:  Negative for chest pain and palpitations.  Gastrointestinal:  Negative for nausea and vomiting.      Objective:    BP 118/78   Pulse 71   Temp (!) 97.5 F (36.4 C) (Oral)   Ht '5\' 3"'$  (1.6 m)   Wt 116 lb 3.2 oz (52.7 kg)   SpO2 98%   BMI 20.58 kg/m   BP Readings from Last 3 Encounters:  07/23/22 118/78  07/15/22 108/71  01/16/22 110/62   Wt Readings from Last 3 Encounters:  07/23/22 116 lb 3.2 oz (52.7 kg)  01/16/22 116 lb 9.6 oz  (52.9 kg)  10/25/21 119 lb (54 kg)    Physical Exam Vitals reviewed.  Constitutional:      Appearance: She is well-developed.  HENT:     Head: Normocephalic and atraumatic.     Right Ear: Hearing, tympanic membrane, ear canal and external ear normal. No decreased hearing noted. No drainage, swelling or tenderness. No middle ear effusion. No foreign body. Tympanic membrane is not erythematous or bulging.     Left Ear: Hearing, tympanic membrane, ear canal and external ear normal. No decreased hearing noted. No drainage, swelling or tenderness.  No middle ear effusion. No foreign body. Tympanic membrane is not erythematous or bulging.     Nose: Nose normal. No rhinorrhea.     Right Sinus: No maxillary sinus tenderness or frontal sinus tenderness.     Left Sinus: No maxillary sinus tenderness or frontal sinus tenderness.     Mouth/Throat:     Pharynx: Uvula midline. No oropharyngeal exudate or posterior oropharyngeal erythema.     Tonsils: No tonsillar abscesses.  Eyes:     Conjunctiva/sclera: Conjunctivae normal.  Cardiovascular:     Rate and Rhythm: Regular rhythm.     Pulses: Normal pulses.     Heart sounds: Normal heart sounds.  Pulmonary:     Effort: Pulmonary effort is normal.     Breath sounds: Normal breath sounds. No wheezing, rhonchi or rales.  Lymphadenopathy:     Head:     Right side of head: No submental, submandibular, tonsillar, preauricular, posterior auricular or occipital adenopathy.     Left side of head: No submental, submandibular, tonsillar, preauricular, posterior auricular or occipital adenopathy.     Cervical: No cervical adenopathy.  Skin:    General: Skin is warm and dry.  Neurological:     Mental Status: She is alert.  Psychiatric:        Speech: Speech normal.        Behavior: Behavior normal.        Thought Content: Thought content normal.

## 2022-07-25 ENCOUNTER — Encounter: Payer: Self-pay | Admitting: Family

## 2022-08-29 ENCOUNTER — Ambulatory Visit: Payer: Managed Care, Other (non HMO) | Admitting: Family

## 2022-08-29 ENCOUNTER — Encounter: Payer: Self-pay | Admitting: Family

## 2022-08-29 ENCOUNTER — Telehealth: Payer: Self-pay | Admitting: Internal Medicine

## 2022-08-29 VITALS — BP 118/70 | HR 76 | Temp 97.9°F | Ht 63.0 in | Wt 116.6 lb

## 2022-08-29 DIAGNOSIS — R1013 Epigastric pain: Secondary | ICD-10-CM

## 2022-08-29 MED ORDER — OMEPRAZOLE 20 MG PO CPDR: 20.0000 mg | DELAYED_RELEASE_CAPSULE | Freq: Every day | ORAL | 3 refills | Status: DC

## 2022-08-29 NOTE — Progress Notes (Signed)
Assessment & Plan:  Epigastric pain Assessment & Plan: No associated chest pain with exercise.  EKG performed today out of abundance of caution which showed sinus bradycardia.  No significant changes compared to previous EKG 07/25/2021 or EKG 02/02/2020. Differential includes GERD, cholelithiasis.  In the setting of abdominal distention, we have ordered complete abdominal ultrasound as well as transvaginal ultrasound.  Start omeprazole 20 mg daily.  Close follow-up  Orders: -     EKG 12-Lead -     US Abdomen Complete; Future -     US PELVIC COMPLETE WITH TRANSVAGINAL; Future -     Omeprazole; Take 1 capsule (20 mg total) by mouth daily. Take 1 hour to 30 minutes prior to breakfast  Dispense: 30 capsule; Refill: 3     Return precautions given.   Risks, benefits, and alternatives of the medications and treatment plan prescribed today were discussed, and patient expressed understanding.   Education regarding symptom management and diagnosis given to patient on AVS either electronically or printed.  Return in about 1 month (around 09/29/2022).  Mable Paris, FNP  Subjective:    Patient ID: Cynthia Houston, female    DOB: 23-Jul-1965, 57 y.o.   MRN: [id number]  CC: Cynthia Houston is a 57 y.o. female who presents today for an acute visit.    HPI: Complains of epigastric for past couple of weeks Worse after a meal.  She has bad taste in her mouth and 'burning in my throat'.   She describes feeling like food in her mouth  She had grits and coffee this morning  Endorses epigastric abdominal pain which can radiate to scapula, and increase gas.   No loss of appetite, choking or pain with swallowing, usual weight loss, nausea, vomiting, dysuria, urinary frequency, flank pain, sob, cough, numbness to left arm, constipation  Walking and aerobic exercise regularly without CP.    History of proximal tachycardia- she reports no increase in frequency.   H/o  anxiety  She was seen 1  year ago 06/2021 for atypical chest pain by her primary Dr Derrel Nip.  Suspect GERD at that time.  Treated with omeprazole , although patient does not recall taking this medication  H/o tubal ligation , hysterectomy ( for noncancerous reasons).   Colonoscopy 04/2020 Dr Allen Norris, polps   Allergies: Sulfa antibiotics, Fiorinal [butalbital-aspirin-caffeine], Percocet [oxycodone-acetaminophen], Augmentin [amoxicillin-pot clavulanate], and Doxycycline Current Outpatient Medications on File Prior to Visit  Medication Sig Dispense Refill   sertraline (ZOLOFT) 50 MG tablet TAKE 1 TABLET BY MOUTH DAILY 90 tablet 1   No current facility-administered medications on file prior to visit.    Review of Systems  Constitutional:  Negative for chills and fever.  Respiratory:  Negative for cough.   Cardiovascular:  Positive for chest pain. Negative for palpitations.  Gastrointestinal:  Positive for abdominal distention and abdominal pain. Negative for constipation, diarrhea, nausea and vomiting.  Genitourinary:  Negative for dysuria.      Objective:    BP 118/70   Pulse 76   Temp 97.9 F (36.6 C) (Oral)   Ht '5\' 3"'$  (1.6 m)   Wt 116 lb 9.6 oz (52.9 kg)   SpO2 99%   BMI 20.65 kg/m   BP Readings from Last 3 Encounters:  08/29/22 118/70  07/23/22 118/78  07/15/22 108/71   Wt Readings from Last 3 Encounters:  08/29/22 116 lb 9.6 oz (52.9 kg)  07/23/22 116 lb 3.2 oz (52.7 kg)  01/16/22 116 lb 9.6 oz (52.9 kg)  Physical Exam Vitals reviewed.  Constitutional:      Appearance: Normal appearance. She is well-developed.  Eyes:     Conjunctiva/sclera: Conjunctivae normal.  Cardiovascular:     Rate and Rhythm: Normal rate and regular rhythm.     Pulses: Normal pulses.     Heart sounds: Normal heart sounds.  Pulmonary:     Effort: Pulmonary effort is normal.     Breath sounds: Normal breath sounds. No wheezing, rhonchi or rales.  Abdominal:     General: Bowel sounds are normal. There is no  distension.     Palpations: Abdomen is soft. Abdomen is not rigid. There is no fluid wave or mass.     Tenderness: There is no abdominal tenderness. There is no right CVA tenderness, left CVA tenderness, guarding or rebound. Negative signs include Murphy's sign.  Skin:    General: Skin is warm and dry.  Neurological:     Mental Status: She is alert.  Psychiatric:        Speech: Speech normal.        Behavior: Behavior normal.        Thought Content: Thought content normal.

## 2022-08-29 NOTE — Patient Instructions (Addendum)
As discussed, if chest pain were to become severe or you experience left arm numbness, jaw pain or shortness of breath, please do not assume this is acid reflux and call 911 or report to the nearest emergency room.   Start omeprazole which will reduce acid production in your stomach.  It is important to take 1 hour to 30 minutes prior to breakfast.  I would advise limiting caffeine is a particular coffee can exacerbate acid reflux.  I have ordered ultrasound.  Let us know if you dont hear back within a week in regards to an appointment being scheduled.   So that you are aware, if you are [facility] MyChart user , please pay attention to your MyChart messages as you may receive a MyChart message with a phone number to call and schedule this test/appointment own your own from our referral coordinator. This is a new process so I do not want you to miss this message.  If you are not a MyChart user, you will receive a phone call.    Food Choices for Gastroesophageal Reflux Disease, Adult When you have gastroesophageal reflux disease (GERD), the foods you eat and your eating habits are very important. Choosing the right foods can help ease the discomfort of GERD. Consider working with a dietitian to help you make healthy food choices. What are tips for following this plan? Reading food labels Look for foods that are low in saturated fat. Foods that have less than 5% of daily value (DV) of fat and 0 g of trans fats may help with your symptoms. Cooking Cook foods using methods other than frying. This may include baking, steaming, grilling, or broiling. These are all methods that do not need a lot of fat for cooking. To add flavor, try to use herbs that are low in spice and acidity. Meal planning  Choose healthy foods that are low in fat, such as fruits, vegetables, whole grains, low-fat dairy products, lean meats, fish, and poultry. Eat frequent, small meals instead of three large meals each day. Eat your  meals slowly, in a relaxed setting. Avoid bending over or lying down until 2-3 hours after eating. Limit high-fat foods such as fatty meats or fried foods. Limit your intake of fatty foods, such as oils, butter, and shortening. Avoid the following as told by your health care provider: Foods that cause symptoms. These may be different for different people. Keep a food diary to keep track of foods that cause symptoms. Alcohol. Drinking large amounts of liquid with meals. Eating meals during the 2-3 hours before bed. Lifestyle Maintain a healthy weight. Ask your health care provider what weight is healthy for you. If you need to lose weight, work with your health care provider to do so safely. Exercise for at least 30 minutes on 5 or more days each week, or as told by your health care provider. Avoid wearing clothes that fit tightly around your waist and chest. Do not use any products that contain nicotine or tobacco. These products include cigarettes, chewing tobacco, and vaping devices, such as e-cigarettes. If you need help quitting, ask your health care provider. Sleep with the head of your bed raised. Use a wedge under the mattress or blocks under the bed frame to raise the head of the bed. Chew sugar-free gum after mealtimes. What foods should I eat?  Eat a healthy, well-balanced diet of fruits, vegetables, whole grains, low-fat dairy products, lean meats, fish, and poultry. Each person is different. Foods that may  trigger symptoms in one person may not trigger any symptoms in another person. Work with your health care provider to identify foods that are safe for you. The items listed above may not be a complete list of recommended foods and beverages. Contact a dietitian for more information. What foods should I avoid? Limiting some of these foods may help manage the symptoms of GERD. Everyone is different. Consult a dietitian or your health care provider to help you identify the exact foods  to avoid, if any. Fruits Any fruits prepared with added fat. Any fruits that cause symptoms. For some people this may include citrus fruits, such as oranges, grapefruit, pineapple, and lemons. Vegetables Deep-fried vegetables. [place] fries. Any vegetables prepared with added fat. Any vegetables that cause symptoms. For some people, this may include tomatoes and tomato products, chili peppers, onions and garlic, and horseradish. Grains Pastries or quick breads with added fat. Meats and other proteins High-fat meats, such as fatty beef or pork, hot dogs, ribs, ham, sausage, salami, and bacon. Fried meat or protein, including fried fish and fried chicken. Nuts and nut butters, in large amounts. Dairy Whole milk and chocolate milk. Sour cream. Cream. Ice cream. Cream cheese. Milkshakes. Fats and oils Butter. Margarine. Shortening. Ghee. Beverages Coffee and tea, with or without caffeine. Carbonated beverages. Sodas. Energy drinks. Fruit juice made with acidic fruits, such as orange or grapefruit. Tomato juice. Alcoholic drinks. Sweets and desserts Chocolate and cocoa. Donuts. Seasonings and condiments Pepper. Peppermint and spearmint. Added salt. Any condiments, herbs, or seasonings that cause symptoms. For some people, this may include curry, hot sauce, or vinegar-based salad dressings. The items listed above may not be a complete list of foods and beverages to avoid. Contact a dietitian for more information. Questions to ask your health care provider Diet and lifestyle changes are usually the first steps that are taken to manage symptoms of GERD. If diet and lifestyle changes do not improve your symptoms, talk with your health care provider about taking medicines. Where to find more information [employer] for Gastrointestinal [employer]: aboutgerd.org Summary When you have gastroesophageal reflux disease (GERD), food and lifestyle choices may be very helpful in easing the  discomfort of GERD. Eat frequent, small meals instead of three large meals each day. Eat your meals slowly, in a relaxed setting. Avoid bending over or lying down until 2-3 hours after eating. Limit high-fat foods such as fatty meats or fried foods. This information is not intended to replace advice given to you by your health care provider. Make sure you discuss any questions you have with your health care provider. Document Revised: 12/21/2019 Document Reviewed: 12/21/2019 Elsevier Patient Education  [place].

## 2022-08-29 NOTE — Telephone Encounter (Signed)
Lft pt vm to call ofc to sch US. thanks ?

## 2022-08-29 NOTE — Assessment & Plan Note (Signed)
No associated chest pain with exercise.  EKG performed today out of abundance of caution which showed sinus bradycardia.  No significant changes compared to previous EKG 07/25/2021 or EKG 02/02/2020. Differential includes GERD, cholelithiasis.  In the setting of abdominal distention, we have ordered complete abdominal ultrasound as well as transvaginal ultrasound.  Start omeprazole 20 mg daily.  Close follow-up

## 2022-09-11 ENCOUNTER — Ambulatory Visit
Admission: RE | Admit: 2022-09-11 | Discharge: 2022-09-11 | Disposition: A | Discharge status: Home / Self Care | Payer: Managed Care, Other (non HMO) | Source: Ambulatory Visit | Attending: Family | Admitting: Family

## 2022-09-11 ENCOUNTER — Encounter: Payer: Self-pay | Admitting: Family

## 2022-09-11 DIAGNOSIS — R1013 Epigastric pain: Secondary | ICD-10-CM

## 2022-09-12 ENCOUNTER — Other Ambulatory Visit: Payer: Self-pay | Admitting: Family

## 2022-09-12 DIAGNOSIS — R1013 Epigastric pain: Secondary | ICD-10-CM

## 2022-09-14 ENCOUNTER — Other Ambulatory Visit: Payer: Self-pay | Admitting: Family

## 2022-09-14 DIAGNOSIS — R1013 Epigastric pain: Secondary | ICD-10-CM

## 2022-09-14 DIAGNOSIS — R140 Abdominal distension (gaseous): Secondary | ICD-10-CM

## 2022-09-20 ENCOUNTER — Other Ambulatory Visit (INDEPENDENT_AMBULATORY_CARE_PROVIDER_SITE_OTHER): Payer: Managed Care, Other (non HMO)

## 2022-09-20 DIAGNOSIS — R1013 Epigastric pain: Secondary | ICD-10-CM

## 2022-09-20 DIAGNOSIS — R140 Abdominal distension (gaseous): Secondary | ICD-10-CM

## 2022-09-25 LAB — CA 125: Cancer Antigen (CA) 125: 23.7 U/mL (ref 0.0–38.1)

## 2022-09-25 LAB — CELIAC DISEASE PANEL
Endomysial IgA: NEGATIVE
IgA/Immunoglobulin A, Serum: 152 mg/dL (ref 87–352)
Transglutaminase IgA: <2 U/mL (ref 0–3)

## 2022-09-25 LAB — FOLLICLE STIMULATING HORMONE: FSH: 153 m[IU]/mL

## 2022-10-02 ENCOUNTER — Encounter: Payer: Self-pay | Admitting: Internal Medicine

## 2022-10-02 ENCOUNTER — Ambulatory Visit: Payer: Managed Care, Other (non HMO) | Admitting: Internal Medicine

## 2022-10-02 VITALS — BP 98/70 | HR 79 | Temp 98.1°F | Ht 63.0 in | Wt 118.2 lb

## 2022-10-02 DIAGNOSIS — K5909 Other constipation: Secondary | ICD-10-CM

## 2022-10-02 DIAGNOSIS — R0789 Other chest pain: Secondary | ICD-10-CM

## 2022-10-02 DIAGNOSIS — R1013 Epigastric pain: Secondary | ICD-10-CM | POA: Diagnosis not present

## 2022-10-02 MED ORDER — SERTRALINE HCL 50 MG PO TABS: 75.0000 mg | ORAL_TABLET | Freq: Every day | ORAL | 1 refills | Status: DC

## 2022-10-02 NOTE — Patient Instructions (Addendum)
Benefiber with prebiotics daily for bowels  Resume omeprazole for 4 weeks,  then switch famotidine 20 mg daily ([facility])  Cardiac CT to be ordered by me  to evaluate atypical chest pain

## 2022-10-02 NOTE — Assessment & Plan Note (Addendum)
Averaging 2  formed nonbloody stools  per week ,  with severe cramping on the days she moves them.  Adding benefiber.  Reviewed 2021 colonoscopy : internal hemorrhoids noted.

## 2022-10-02 NOTE — Assessment & Plan Note (Signed)
Treated as GERD with omeprazole and alprazolam prn anxiety provoked episodes , but not completely resolved. She denies dysphagia.  She remains concerned about CAD.  Nonivasive imaging wit coronary calcium CT scan

## 2022-10-02 NOTE — Assessment & Plan Note (Signed)
Improved symptoms with use of PPI. Recommend completing 6 weeks of PPI then switching to H2 blockers.if symptoms can be controlled.

## 2022-10-02 NOTE — Progress Notes (Signed)
Subjective:  Patient ID: Cynthia Houston, female    DOB: 04-14-1966  Age: 57 y.o. MRN: [id number]  CC: The primary encounter diagnosis was Atypical chest pain. Diagnoses of Epigastric pain and Chronic constipation were also pertinent to this visit.   HPI Cynthia Houston presents for follow up on multiple issues  Chief Complaint  Patient presents with   Medical Management of Chronic Issues    1 month follow up    1) Left sided chest wall pain that radiates to  left scapula .  The pain has been Occurring at rest, not with exertion.  Not affected by arm movement or deep breathing.  He was treated by [facility] for GERD with omeprazole in early March and symptoms improved but are not resolved.  She has recently stopped omeprazole and is  [facility]   2) abdominal distension , pain   and bloating I:  reviewed pelvic ultrasound and last colonoscopy.  Lower abdominal pain , cramping at times is quite severe  when she is preparing to move bowels .  Has chronic constipation   3) Epigastric pain: Improved with omeprazole.    .   Outpatient Medications Prior to Visit  Medication Sig Dispense Refill   omeprazole (PRILOSEC) 20 MG capsule Take 1 capsule (20 mg total) by mouth daily. Take 1 hour to 30 minutes prior to breakfast 30 capsule 3   sertraline (ZOLOFT) 50 MG tablet TAKE 1 TABLET BY MOUTH DAILY 90 tablet 1   No facility-administered medications prior to visit.    Review of Systems;  Patient denies headache, fevers, malaise, unintentional weight loss, skin rash, eye pain, sinus congestion and sinus pain, sore throat, dysphagia,  hemoptysis , cough, dyspnea, wheezing, chest pain, palpitations, orthopnea, edema, abdominal pain, nausea, melena, diarrhea, constipation, flank pain, dysuria, hematuria, urinary  Frequency, nocturia, numbness, tingling, seizures,  Focal weakness, Loss of consciousness,  Tremor, insomnia, depression, anxiety, and suicidal ideation.       Objective:  BP 98/70   Pulse 79   Temp 98.1 F (36.7 C) (Oral)   Ht 5\' 3"  (1.6 m)   Wt 118 lb 3.2 oz (53.6 kg)   SpO2 100%   BMI 20.94 kg/m   BP Readings from Last 3 Encounters:  10/02/22 98/70  08/29/22 118/70  07/23/22 118/78    Wt Readings from Last 3 Encounters:  10/02/22 118 lb 3.2 oz (53.6 kg)  08/29/22 116 lb 9.6 oz (52.9 kg)  07/23/22 116 lb 3.2 oz (52.7 kg)    Physical Exam Vitals reviewed.  Constitutional:      General: She is not in acute distress.    Appearance: Normal appearance. She is normal weight. She is not ill-appearing, toxic-appearing or diaphoretic.  HENT:     Head: Normocephalic.  Eyes:     General: No scleral icterus.       Right eye: No discharge.        Left eye: No discharge.     Conjunctiva/sclera: Conjunctivae normal.  Cardiovascular:     Rate and Rhythm: Normal rate and regular rhythm.     Heart sounds: Normal heart sounds.  Pulmonary:     Effort: Pulmonary effort is normal. No respiratory distress.     Breath sounds: Normal breath sounds.  Musculoskeletal:        General: Normal range of motion.  Skin:    General: Skin is warm and dry.  Neurological:     General: No focal deficit present.  Mental Status: She is alert and oriented to person, place, and time. Mental status is at baseline.  Psychiatric:        Mood and Affect: Mood normal.        Behavior: Behavior normal.        Thought Content: Thought content normal.        Judgment: Judgment normal.     Lab Results  Component Value Date   HGBA1C 5.7 (H) 01/25/2022   HGBA1C 5.6 01/06/2020   HGBA1C 5.6 12/19/2016    Lab Results  Component Value Date   CREATININE 0.88 01/25/2022   CREATININE 0.76 08/25/2021   CREATININE 0.84 01/31/2021    Lab Results  Component Value Date   WBC 3.9 01/25/2022   HGB 14.0 01/25/2022   HCT 42.8 01/25/2022   PLT 244 01/25/2022   GLUCOSE 96 01/25/2022   CHOL 196 01/25/2022   TRIG 52 01/25/2022   HDL 84 01/25/2022    LDLDIRECT 106 (H) 01/25/2022   LDLCALC 102 (H) 01/25/2022   ALT 39 (H) 01/25/2022   AST 24 01/25/2022   NA 139 01/25/2022   K 4.7 01/25/2022   CL 102 01/25/2022   CREATININE 0.88 01/25/2022   BUN 17 01/25/2022   CO2 23 01/25/2022   TSH 1.310 01/25/2022   HGBA1C 5.7 (H) 01/25/2022    US Pelvic Complete With Transvaginal  Result Date: 09/11/2022 CLINICAL DATA:  Abdominal distention and pain for weeks question ovarian mass, postmenopausal EXAM: TRANSABDOMINAL AND TRANSVAGINAL ULTRASOUND OF PELVIS TECHNIQUE: Both transabdominal and transvaginal ultrasound examinations of the pelvis were performed. Transabdominal technique was performed for global imaging of the pelvis including uterus, ovaries, adnexal regions, and pelvic cul-de-sac. It was necessary to proceed with endovaginal exam following the transabdominal exam to visualize the ovaries and adnexa. COMPARISON:  06/01/2014 FINDINGS: Uterus Surgically absent Endometrium Surgically absent Right ovary Measurements: 2.2 x 1.7 x 1.3 cm = volume: 2.5 mL. Limited visualization on transabdominal imaging, obscured on transvaginal imaging. Questionably visualized. No mass. Left ovary Not visualized, likely obscured by bowel Other findings No free pelvic fluid or adnexal masses. IMPRESSION: Post hysterectomy with nonvisualization of LEFT ovary and questionable visualization of a normal appearing RIGHT ovary. No acute abnormalities. Electronically Signed   By: Ulyses Southward M.D.   On: 09/11/2022 18:41   US Abdomen Complete  Result Date: 09/11/2022 CLINICAL DATA:  Epigastric pain EXAM: ABDOMEN ULTRASOUND COMPLETE COMPARISON:  None Available. FINDINGS: Gallbladder: No gallstones or wall thickening visualized. No sonographic Murphy sign noted by sonographer. Common bile duct: Diameter: 3.6 mm Liver: No focal lesion identified. Within normal limits in parenchymal echogenicity. Portal vein is patent on color Doppler imaging with normal direction of blood flow  towards the liver. IVC: No abnormality visualized. Pancreas: Visualized portion unremarkable. Spleen: Size and appearance within normal limits. Right Kidney: Length: 10.7 cm. Echogenicity within normal limits. No mass or hydronephrosis visualized. Left Kidney: Length: 12.0 cm. Echogenicity within normal limits. No mass or hydronephrosis visualized. Abdominal aorta: No aneurysm visualized. Other findings: None. IMPRESSION: No cholelithiasis or sonographic evidence for acute cholecystitis. Electronically Signed   By: Annia Belt M.D.   On: 09/11/2022 10:00    Assessment & Plan:  .Atypical chest pain Assessment & Plan: Treated as GERD with omeprazole and alprazolam prn anxiety provoked episodes , but not completely resolved. She denies dysphagia.  She remains concerned about CAD.  Nonivasive imaging wit coronary calcium CT scan   Orders: -     CT CORONARY MORPH W/CTA COR W/SCORE  W/CA W/CM &/OR WO/CM; Future  Epigastric pain Assessment & Plan: Improved symptoms with use of PPI. Recommend completing 6 weeks of PPI then switching to H2 blockers.if symptoms can be controlled.    Chronic constipation Assessment & Plan: Averaging 2  formed nonbloody stools  per week ,  with severe cramping on the days she moves them.  Adding benefiber.  Reviewed 2021 colonoscopy : internal hemorrhoids noted.    Other orders -     Sertraline HCl; Take 1.5 tablets (75 mg total) by mouth daily. For 2 weeks,  then increase  TO 2 TABLETS DAILY  Dispense: 90 tablet; Refill: 1     I provided 40 minutes of f the day of this encounter reviewing patient's last visit with NP Arnette, , patient's  most recent visit with cardiology,  previous colonoscopy ,  previous  labs and imaging studies, counseling on currently addressed issues,  and post visit ordering to diagnostics and therapeutics .   Follow-up: Return in about 4 weeks (around 10/30/2022).   Sherlene Shams, MD

## 2022-10-03 ENCOUNTER — Telehealth: Payer: Self-pay | Admitting: Internal Medicine

## 2022-10-03 NOTE — Addendum Note (Signed)
Addended by: Sherlene Shams on: 10/03/2022 12:37 PM   Modules accepted: Orders

## 2022-10-03 NOTE — Telephone Encounter (Signed)
Lft pt vm to call ofc to sch CT. thanks 

## 2022-10-05 ENCOUNTER — Ambulatory Visit (HOSPITAL_COMMUNITY): Payer: Managed Care, Other (non HMO)

## 2022-10-09 ENCOUNTER — Ambulatory Visit
Admission: RE | Admit: 2022-10-09 | Discharge: 2022-10-09 | Disposition: A | Discharge status: Home / Self Care | Payer: Managed Care, Other (non HMO) | Source: Ambulatory Visit | Attending: Internal Medicine | Admitting: Internal Medicine

## 2022-10-09 DIAGNOSIS — R0789 Other chest pain: Secondary | ICD-10-CM | POA: Insufficient documentation

## 2022-10-30 ENCOUNTER — Ambulatory Visit: Payer: Managed Care, Other (non HMO) | Admitting: Internal Medicine

## 2022-11-06 ENCOUNTER — Ambulatory Visit: Payer: Managed Care, Other (non HMO) | Admitting: Dermatology

## 2022-11-06 ENCOUNTER — Encounter: Payer: Self-pay | Admitting: Dermatology

## 2022-11-06 VITALS — BP 113/64 | HR 63

## 2022-11-06 DIAGNOSIS — L821 Other seborrheic keratosis: Secondary | ICD-10-CM | POA: Diagnosis not present

## 2022-11-06 DIAGNOSIS — L578 Other skin changes due to chronic exposure to nonionizing radiation: Secondary | ICD-10-CM

## 2022-11-06 DIAGNOSIS — L738 Other specified follicular disorders: Secondary | ICD-10-CM | POA: Diagnosis not present

## 2022-11-06 DIAGNOSIS — L814 Other melanin hyperpigmentation: Secondary | ICD-10-CM

## 2022-11-06 DIAGNOSIS — Z1283 Encounter for screening for malignant neoplasm of skin: Secondary | ICD-10-CM

## 2022-11-06 DIAGNOSIS — Z86018 Personal history of other benign neoplasm: Secondary | ICD-10-CM

## 2022-11-06 DIAGNOSIS — X32XXXA Exposure to sunlight, initial encounter: Secondary | ICD-10-CM

## 2022-11-06 DIAGNOSIS — D229 Melanocytic nevi, unspecified: Secondary | ICD-10-CM

## 2022-11-06 DIAGNOSIS — W908XXA Exposure to other nonionizing radiation, initial encounter: Secondary | ICD-10-CM

## 2022-11-06 DIAGNOSIS — D1801 Hemangioma of skin and subcutaneous tissue: Secondary | ICD-10-CM

## 2022-11-06 NOTE — Progress Notes (Signed)
   Follow-Up Visit   Subjective  Cynthia Houston is a 57 y.o. female who presents for the following: Skin Cancer Screening and Full Body Skin Exam. HxDN  The patient presents for Total-Body Skin Exam (TBSE) for skin cancer screening and mole check. The patient has spots, moles and lesions to be evaluated, some may be new or changing and the patient has concerns that these could be cancer.    The following portions of the chart were reviewed this encounter and updated as appropriate: medications, allergies, medical history  Review of Systems:  No other skin or systemic complaints except as noted in HPI or Assessment and Plan.  Objective  Well appearing patient in no apparent distress; mood and affect are within normal limits.  A full examination was performed including scalp, head, eyes, ears, nose, lips, neck, chest, axillae, abdomen, back, buttocks, bilateral upper extremities, bilateral lower extremities, hands, feet, fingers, toes, fingernails, and toenails. All findings within normal limits unless otherwise noted below.   Relevant physical exam findings are noted in the Assessment and Plan.    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 03/07/2007. No evidence of recurrence today Recommend regular full body skin exams Recommend daily broad spectrum sunscreen SPF 30+ to sun-exposed areas, reapply every 2 hours as needed.  Call if any new or changing lesions are noted between office visits   LENTIGINES, SEBORRHEIC KERATOSES, HEMANGIOMAS - Benign normal skin lesions - Benign-appearing - Call for any changes  MELANOCYTIC NEVI - Tan-brown and/or pink-flesh-colored symmetric macules and papules - Benign appearing on exam today - Observation - Call clinic for new or changing moles - Recommend daily use of broad spectrum spf 30+ sunscreen to sun-exposed areas.  - Check nails when remove polish.   ACTINIC DAMAGE - Chronic  condition, secondary to cumulative UV/sun exposure - diffuse scaly erythematous macules with underlying dyspigmentation - Recommend daily broad spectrum sunscreen SPF 30+ to sun-exposed areas, reapply every 2 hours as needed.  - Staying in the shade or wearing long sleeves, sun glasses (UVA+UVB protection) and wide brim hats (4-inch brim around the entire circumference of the hat) are also recommended for sun protection.  - Call for new or changing lesions.  SEBORRHEIC KERATOSIS - Right Lateral Breast 10 x 7 mm waxy tan papule with slightly darker edge, without features suspicious for malignancy on dermoscopy - Benign-appearing - Discussed benign etiology and prognosis. - Observe - Call for any changes  Sebaceous Hyperplasia - Small yellow papules with a central dell at face, right paranasal. - Benign-appearing - Observe. Call for changes.    SKIN CANCER SCREENING PERFORMED TODAY.   Return in about 1 year (around 11/06/2023) for TBSE, HxDN.  I, Lawson Radar, CMA, am acting as scribe for Willeen Niece, MD.   Documentation: I have reviewed the above documentation for accuracy and completeness, and I agree with the above.  Willeen Niece, MD

## 2022-11-06 NOTE — Patient Instructions (Addendum)
Recommend daily broad spectrum sunscreen SPF 30+ to sun-exposed areas, reapply every 2 hours as needed. Call for new or changing lesions.  Staying in the shade or wearing long sleeves, sun glasses (UVA+UVB protection) and wide brim hats (4-inch brim around the entire circumference of the hat) are also recommended for sun protection.    Seborrheic Keratosis  What causes seborrheic keratoses? Seborrheic keratoses are harmless, common skin growths that first appear during adult life.  As time goes by, more growths appear.  Some people may develop a large number of them.  Seborrheic keratoses appear on both covered and uncovered body parts.  They are not caused by sunlight.  The tendency to develop seborrheic keratoses can be inherited.  They vary in color from skin-colored to gray, brown, or even black.  They can be either smooth or have a rough, warty surface.   Seborrheic keratoses are superficial and look as if they were stuck on the skin.  Under the microscope this type of keratosis looks like layers upon layers of skin.  That is why at times the top layer may seem to fall off, but the rest of the growth remains and re-grows.    Treatment Seborrheic keratoses do not need to be treated, but can easily be removed in the office.  Seborrheic keratoses often cause symptoms when they rub on clothing or jewelry.  Lesions can be in the way of shaving.  If they become inflamed, they can cause itching, soreness, or burning.  Removal of a seborrheic keratosis can be accomplished by freezing, burning, or surgery. If any spot bleeds, scabs, or grows rapidly, please return to have it checked, as these can be an indication of a skin cancer.  Melanoma ABCDEs  Melanoma is the most dangerous type of skin cancer, and is the leading cause of death from skin disease.  You are more likely to develop melanoma if you: Have light-colored skin, light-colored eyes, or red or blond hair Spend a lot of time in the sun Tan  regularly, either outdoors or in a tanning bed Have had blistering sunburns, especially during childhood Have a close family member who has had a melanoma Have atypical moles or large birthmarks  Early detection of melanoma is key since treatment is typically straightforward and cure rates are extremely high if we catch it early.   The first sign of melanoma is often a change in a mole or a new dark spot.  The ABCDE system is a way of remembering the signs of melanoma.  A for asymmetry:  The two halves do not match. B for border:  The edges of the growth are irregular. C for color:  A mixture of colors are present instead of an even brown color. D for diameter:  Melanomas are usually (but not always) greater than 6mm - the size of a pencil eraser. E for evolution:  The spot keeps changing in size, shape, and color.  Please check your skin once per month between visits. You can use a small mirror in front and a large mirror behind you to keep an eye on the back side or your body.   If you see any new or changing lesions before your next follow-up, please call to schedule a visit.  Please continue daily skin protection including broad spectrum sunscreen SPF 30+ to sun-exposed areas, reapplying every 2 hours as needed when you're outdoors.   Staying in the shade or wearing long sleeves, sun glasses (UVA+UVB protection) and wide brim   hats (4-inch brim around the entire circumference of the hat) are also recommended for sun protection.     Due to recent changes in healthcare laws, you may see results of your pathology and/or laboratory studies on MyChart before the doctors have had a chance to review them. We understand that in some cases there may be results that are confusing or concerning to you. Please understand that not all results are received at the same time and often the doctors may need to interpret multiple results in order to provide you with the best plan of care or course of  treatment. Therefore, we ask that you please give us 2 business days to thoroughly review all your results before contacting the office for clarification. Should we see a critical lab result, you will be contacted sooner.   If You Need Anything After Your Visit  If you have any questions or concerns for your doctor, please call our main line at [phone] and press option 4 to reach your doctor's medical assistant. If no one answers, please leave a voicemail as directed and we will return your call as soon as possible. Messages left after 4 pm will be answered the following business day.   You may also send us a message via MyChart. We typically respond to MyChart messages within 1-2 business days.  For prescription refills, please ask your pharmacy to contact our office. Our fax number is [phone].  If you have an urgent issue when the clinic is closed that cannot wait until the next business day, you can page your doctor at the number below.    Please note that while we do our best to be available for urgent issues outside of office hours, we are not available 24/7.   If you have an urgent issue and are unable to reach us, you may choose to seek medical care at your doctor's office, retail clinic, urgent care center, or emergency room.  If you have a medical emergency, please immediately call 911 or go to the emergency department.  Pager Numbers  - Dr. Kowalski: [phone]  - Dr. Moye: [phone]  - Dr. Stewart: [phone]  In the event of inclement weather, please call our main line at [phone] for an update on the status of any delays or closures.  Dermatology Medication Tips: Please keep the boxes that topical medications come in in order to help keep track of the instructions about where and how to use these. Pharmacies typically print the medication instructions only on the boxes and not directly on the medication tubes.   If your medication is too expensive,  please contact our office at [phone] option 4 or send us a message through MyChart.   We are unable to tell what your co-pay for medications will be in advance as this is different depending on your insurance coverage. However, we may be able to find a substitute medication at lower cost or fill out paperwork to get insurance to cover a needed medication.   If a prior authorization is required to get your medication covered by your insurance company, please allow us 1-2 business days to complete this process.  Drug prices often vary depending on where the prescription is filled and some pharmacies may offer cheaper prices.  The website www.goodrx.com contains coupons for medications through different pharmacies. The prices here do not account for what the cost may be with help from insurance (it may be cheaper with your insurance), but the website can give you   the price if you did not use any insurance.  - You can print the associated coupon and take it with your prescription to the pharmacy.  - You may also stop by our office during regular business hours and pick up a GoodRx coupon card.  - If you need your prescription sent electronically to a different pharmacy, notify our office through [facility] MyChart or by phone at [phone] option 4.     Si Usted Necesita Algo Despus de Su Visita  Tambin puede enviarnos un mensaje a travs de MyChart. Por lo general respondemos a los mensajes de MyChart en el transcurso de 1 a 2 das hbiles.  Para renovar recetas, por favor pida a su farmacia que se ponga en contacto con nuestra oficina. Nuestro nmero de fax es el [phone].  Si tiene un asunto urgente cuando la clnica est cerrada y que no puede esperar hasta el siguiente da hbil, puede llamar/localizar a su doctor(a) al nmero que aparece a continuacin.   Por favor, tenga en cuenta que aunque hacemos todo lo posible para estar disponibles para asuntos urgentes fuera del  horario de oficina, no estamos disponibles las 24 horas del da, los 7 das de la semana.   Si tiene un problema urgente y no puede comunicarse con nosotros, puede optar por buscar atencin mdica  en el consultorio de su doctor(a), en una clnica privada, en un centro de atencin urgente o en una sala de emergencias.  Si tiene una emergencia mdica, por favor llame inmediatamente al 911 o vaya a la sala de emergencias.  Nmeros de bper  - Dr. Kowalski: [phone]  - Dra. Moye: [phone]  - Dra. Stewart: [phone]  En caso de inclemencias del tiempo, por favor llame a nuestra lnea principal al [phone] para una actualizacin sobre el estado de cualquier retraso o cierre.  Consejos para la medicacin en dermatologa: Por favor, guarde las cajas en las que vienen los medicamentos de uso tpico para ayudarle a seguir las instrucciones sobre dnde y cmo usarlos. Las farmacias generalmente imprimen las instrucciones del medicamento slo en las cajas y no directamente en los tubos del medicamento.   Si su medicamento es muy caro, por favor, pngase en contacto con nuestra oficina llamando al [phone] y presione la opcin 4 o envenos un mensaje a travs de MyChart.   No podemos decirle cul ser su copago por los medicamentos por adelantado ya que esto es diferente dependiendo de la cobertura de su seguro. Sin embargo, es posible que podamos encontrar un medicamento sustituto a menor costo o llenar un formulario para que el seguro cubra el medicamento que se considera necesario.   Si se requiere una autorizacin previa para que su compaa de seguros cubra su medicamento, por favor permtanos de 1 a 2 das hbiles para completar este proceso.  Los precios de los medicamentos varan con frecuencia dependiendo del lugar de dnde se surte la receta y alguna farmacias pueden ofrecer precios ms baratos.  El sitio web www.goodrx.com tiene cupones para medicamentos de diferentes  farmacias. Los precios aqu no tienen en cuenta lo que podra costar con la ayuda del seguro (puede ser ms barato con su seguro), pero el sitio web puede darle el precio si no utiliz ningn seguro.  - Puede imprimir el cupn correspondiente y llevarlo con su receta a la farmacia.  - Tambin puede pasar por nuestra oficina durante el horario de atencin regular y recoger una tarjeta de cupones de GoodRx.  - Si necesita   que su receta se enve electrnicamente a una farmacia diferente, informe a nuestra oficina a travs de MyChart de [place] o por telfono llamando al [phone] y presione la opcin 4.  

## 2022-12-10 ENCOUNTER — Encounter: Payer: Self-pay | Admitting: Emergency Medicine

## 2022-12-10 ENCOUNTER — Other Ambulatory Visit: Payer: Self-pay

## 2022-12-10 ENCOUNTER — Ambulatory Visit
Admission: EM | Admit: 2022-12-10 | Discharge: 2022-12-10 | Disposition: A | Discharge status: Home / Self Care | Payer: Managed Care, Other (non HMO)

## 2022-12-10 DIAGNOSIS — Z91038 Other insect allergy status: Secondary | ICD-10-CM | POA: Diagnosis not present

## 2022-12-10 DIAGNOSIS — W57XXXA Bitten or stung by nonvenomous insect and other nonvenomous arthropods, initial encounter: Secondary | ICD-10-CM

## 2022-12-10 NOTE — ED Provider Notes (Signed)
Cynthia Houston    CSN: [id number] Arrival date & time: 12/10/22  0945      History   Chief Complaint Chief Complaint  Patient presents with   Insect Bite    HPI Cynthia Houston is a 57 y.o. female.   HPI  Presents to urgent care with complaint of insect bites to left hand, arms and legs bilaterally.  She states her left hand is warm and swollen.  [place]  Past Medical History:  Diagnosis Date   Chicken pox    Depression    Diffuse cystic mastopathy 2013   Family history of adverse reaction to anesthesia    Mother - PONV   Headache(784.0)    Hx of dysplastic nevus 03/07/2007   right lateral thigh, slight to mod   Osteoarthritis    right hand   PONV (postoperative nausea and vomiting)    Tendinitis    right elbow   Urinary tract infection     Patient Active Problem List   Diagnosis Date Noted   Bronchitis 07/23/2022   Restless legs 01/16/2022   Leg pain 01/16/2022   Paroxysmal tachycardia, unspecified (HCC) 07/25/2021   Atypical chest pain 07/25/2021   Encounter for screening colonoscopy    Family history of colonic polyps    Polyp of colon    Personal history of COVID-19 12/21/2019   Hallux valgus, acquired, left 08/12/2019   History of COVID-19 08/06/2019   Generalized osteoarthritis of hand 05/04/2019   Joint pain in fingers of right hand 12/16/2018   Intermittent palpitations 12/15/2017   Cervical disc disorder at C5-C6 level with radiculopathy 07/18/2017   Elevated ALT measurement 12/21/2016   Generalized anxiety disorder 01/03/2016   S/P abdominal supracervical subtotal hysterectomy 06/03/2014   Dyspareunia, female 05/07/2014   Epigastric pain 03/09/2013   Chronic constipation 11/20/2012   Encounter for preventive health examination 11/20/2012   Fibrocystic breast disease 09/24/2012   History of abnormal cervical Pap smear 05/07/2012   Neutropenia (HCC) 05/07/2012    Past  Surgical History:  Procedure Laterality Date   ABDOMINAL HYSTERECTOMY  2008   supracervical    AIKEN OSTEOTOMY Left 07/16/2019   Procedure: PHALANX OSTEOTOMY AKIN;  Surgeon: Recardo Evangelist, DPM;  Location: [facility];  Service: Podiatry;  Laterality: Left;   BREAST CYST ASPIRATION Right    neg   BUNIONECTOMY Right 2011   BUNIONECTOMY Left 07/16/2019   Procedure: LAPIDUS-TYPE;  Surgeon: Recardo Evangelist, DPM;  Location: [facility];  Service: Podiatry;  Laterality: Left;  LMA WITH BLOCK   COLONOSCOPY  2008   Dr. Mechele Collin   COLONOSCOPY WITH PROPOFOL N/A 05/24/2020   Procedure: COLONOSCOPY WITH PROPOFOL;  Surgeon: Midge Minium, MD;  Location: [facility];  Service: Endoscopy;  Laterality: N/A;   DIAGNOSTIC LAPAROSCOPY     ECTOPIC PREGNANCY SURGERY  1989   gynecological surgery   2004   TUBAL LIGATION     UTERINE FIBROID SURGERY  2002   x2 2004   WEIL OSTEOTOMY Left 07/16/2019   Procedure: WEIL OSTEOTOMY;  Surgeon: Recardo Evangelist, DPM;  Location: [facility];  Service: Podiatry;  Laterality: Left;    OB History     Gravida  1   Para  1   Term      Preterm      AB      Living  1      SAB      IAB  Ectopic      Multiple      Live Births           Obstetric Comments  First pregnancy 55 First menstrual 10          Home Medications    Prior to Admission medications   Medication Sig Start Date End Date Taking? Authorizing Provider  omeprazole (PRILOSEC) 20 MG capsule Take 1 capsule (20 mg total) by mouth daily. Take 1 hour to 30 minutes prior to breakfast Patient not taking: Reported on 12/10/2022 08/29/22   Allegra Grana, FNP  sertraline (ZOLOFT) 50 MG tablet Take 1.5 tablets (75 mg total) by mouth daily. For 2 weeks,  then increase  TO 2 TABLETS DAILY 10/02/22   Sherlene Shams, MD    Family History Family History  Problem Relation Age of Onset   Hyperlipidemia Mother    Hypertension Mother    Arthritis Mother         rheumatoid arthritis   Crohn's disease Father    Heart disease Father 56       CABG   Hyperlipidemia Father    Hypertension Father    Breast cancer Neg Hx     Social History Social History   Tobacco Use   Smoking status: Never   Smokeless tobacco: Never  Vaping Use   Vaping Use: Never used  Substance Use Topics   Alcohol use: Yes    Alcohol/week: 2.0 standard drinks of alcohol    Types: 2 Glasses of wine per week   Drug use: No     Allergies   Sulfa antibiotics, Fiorinal [butalbital-aspirin-caffeine], Percocet [oxycodone-acetaminophen], Augmentin [amoxicillin-pot clavulanate], and Doxycycline   Review of Systems Review of Systems   Physical Exam Triage Vital Signs ED Triage Vitals  Enc Vitals Group     BP 12/10/22 1046 112/71     Pulse Rate 12/10/22 1046 67     Resp 12/10/22 1046 18     Temp 12/10/22 1046 98.4 F (36.9 C)     Temp Source 12/10/22 1046 Oral     SpO2 12/10/22 1046 98 %     Weight --      Height --      Head Circumference --      Peak Flow --      Pain Score 12/10/22 1051 0     Pain Loc --      Pain Edu? --      Excl. in GC? --    No data found.  Updated Vital Signs BP 112/71 (BP Location: Left Arm)   Pulse 67   Temp 98.4 F (36.9 C) (Oral)   Resp 18   SpO2 98%   Visual Acuity Right Eye Distance:   Left Eye Distance:   Bilateral Distance:    Right Eye Near:   Left Eye Near:    Bilateral Near:     Physical Exam Vitals reviewed.  Constitutional:      Appearance: Normal appearance.  Skin:    General: Skin is warm and dry.     Findings: Rash present.  Neurological:     General: No focal deficit present.     Mental Status: She is alert and oriented to person, place, and time.  Psychiatric:        Mood and Affect: Mood normal.        Behavior: Behavior normal.      UC Treatments / Results  Labs (all labs ordered are listed, but only abnormal results  are displayed) Labs Reviewed - No data to  display  EKG   Radiology No results found.  Procedures Procedures (including critical care time)  Medications Ordered in UC Medications - No data to display  Initial Impression / Assessment and Plan / UC Course  I have reviewed the triage vital signs and the nursing notes.  Pertinent labs & imaging results that were available during my care of the patient were reviewed by me and considered in my medical decision making (see chart for details).   ARIAS BARMES is a 57 y.o. female presenting with multiple insect bites. Patient is afebrile without recent antipyretics, satting well on room air. Overall is well appearing , well hydrated, without respiratory distress.  Multiple welts on her hands arms and legs from presumed insect bites.  Inflammation of skin surrounding several of the bites especially on her hands.  No discharge or signs of infection.  Reviewed relevant chart history.   Local inflammatory reaction from multiple insect bites.  No concern for infection or systemic anaphylaxis.  Recommended continued use of OTC antihistamines versus prescription for systemic corticosteroid therapy.  Given patient's assertion of poor response (anxiety) to prednisone, will decline to prescribe.  Patient will use Claritin during the day and Benadryl at night.  Counseled patient on potential for adverse effects with medications prescribed/recommended today, ER and return-to-clinic precautions discussed, patient verbalized understanding and agreement with care plan.    Final Clinical Impressions(s) / UC Diagnoses   Final diagnoses:  None   Discharge Instructions   None    ED Prescriptions   None    PDMP not reviewed this encounter.   Charma Igo, [place] 12/10/22 1107

## 2022-12-10 NOTE — Discharge Instructions (Signed)
Continue to use antihistamines to treat your symptoms.  Watch for signs of infection.  Avoid scratching.  Follow up here or with your primary care provider if your symptoms are worsening or not improving.

## 2022-12-10 NOTE — ED Triage Notes (Signed)
Insect bites to left hand, arms and legs.  Left hand is warm and swollen.  [place]  Patient has used otc medicine to apply.  Has taken a claritin

## 2022-12-11 ENCOUNTER — Ambulatory Visit: Payer: Managed Care, Other (non HMO) | Admitting: Nurse Practitioner

## 2022-12-24 DIAGNOSIS — K828 Other specified diseases of gallbladder: Secondary | ICD-10-CM

## 2022-12-24 HISTORY — DX: Other specified diseases of gallbladder: K82.8

## 2023-01-01 ENCOUNTER — Encounter: Payer: Self-pay | Admitting: Gastroenterology

## 2023-01-01 ENCOUNTER — Ambulatory Visit: Payer: Managed Care, Other (non HMO) | Admitting: Gastroenterology

## 2023-01-01 VITALS — BP 116/75 | HR 62 | Temp 98.3°F | Wt 121.0 lb

## 2023-01-01 DIAGNOSIS — R1011 Right upper quadrant pain: Secondary | ICD-10-CM | POA: Diagnosis not present

## 2023-01-01 NOTE — Patient Instructions (Addendum)
Your HIDA scan has been scheduled at [facility], [facility] entrance.  You must arrive at 8:30 AM on July 18th to register.  If you need to cancel or reschedule, please call scheduling directly at [phone]

## 2023-01-01 NOTE — Progress Notes (Signed)
Primary Care Physician: Sherlene Shams, MD  Primary Gastroenterologist:  Dr. Midge Minium  Chief Complaint  Patient presents with   Abdominal Pain    RUQ and intermittent bloating Patient tried Lactaid milk and noticed some improvement so she stopped the Prilosec   Nausea    intermittent   Gastroesophageal Reflux    HPI: Cynthia Houston is a 57 y.o. female here for follow-up after seeing me last year for abdominal pain.  At that time the patient had abdominal pain and a change in bowel habits consisting of constipation that started after her diarrheal illness.  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  The patient had seen her primary care provider was reporting constipation with nonbloody stools twice a week.  The last visit with me the patient was given dicyclomine for her abdominal cramps. The patient reports that she had a negative ultrasound of her right upper quadrant but her pain has been.  She also reports some nausea with the pain.  Past Medical History:  Diagnosis Date   Chicken pox    Depression    Diffuse cystic mastopathy 2013   Family history of adverse reaction to anesthesia    Mother - PONV   Headache(784.0)    Hx of dysplastic nevus 03/07/2007   right lateral thigh, slight to mod   Osteoarthritis    right hand   PONV (postoperative nausea and vomiting)    Tendinitis    right elbow   Urinary tract infection     Current Outpatient Medications  Medication Sig Dispense Refill   sertraline (ZOLOFT) 50 MG tablet Take 1.5 tablets (75 mg total) by mouth daily. For 2 weeks,  then increase  TO 2 TABLETS DAILY 90 tablet 1   omeprazole (PRILOSEC) 20 MG capsule Take 1 capsule (20 mg total) by mouth daily. Take 1 hour to 30 minutes prior to breakfast (Patient not taking: Reported on 01/01/2023) 30 capsule 3   No current facility-administered medications for  this visit.    Allergies as of 01/01/2023 - Review Complete 01/01/2023  Allergen Reaction Noted   Sulfa antibiotics Rash    Fiorinal [butalbital-aspirin-caffeine] Nausea And Vomiting 07/08/2019   Percocet [oxycodone-acetaminophen] Nausea And Vomiting 05/05/2012   Augmentin [amoxicillin-pot clavulanate] Rash 05/05/2012   Doxycycline Rash 07/19/2015    ROS:  General: Negative for anorexia, weight loss, fever, chills, fatigue, weakness. ENT: Negative for hoarseness, difficulty swallowing , nasal congestion. CV: Negative for chest pain, angina, palpitations, dyspnea on exertion, peripheral edema.  Respiratory: Negative for dyspnea at rest, dyspnea on exertion, cough, sputum, wheezing.  GI: See history of present illness. GU:  Negative for dysuria, hematuria, urinary incontinence, urinary frequency, nocturnal urination.  Endo: Negative for unusual weight change.    Physical Examination:   BP 116/75 (BP Location: Left Arm, Patient Position: Sitting, Cuff Size: Normal)   Pulse 62   Temp 98.3 F (36.8 C) (Oral)   Wt 121 lb (54.9 kg)   BMI 21.43 kg/m   General: Well-nourished, well-developed in no acute distress.  Eyes: No icterus. Conjunctivae pink. Lungs: Clear to auscultation bilaterally. Non-labored. Heart: Regular rate and rhythm, no murmurs rubs or gallops.  Abdomen: Bowel sounds are normal, mild tenderness in the right upper quadrant on deep palpation, nondistended, no hepatosplenomegaly or masses, no abdominal bruits or hernia , no rebound or guarding.   Extremities: No lower extremity edema. No clubbing or  deformities. Neuro: Alert and oriented x 3.  Grossly intact. Skin: Warm and dry, no jaundice.   Psych: Alert and cooperative, normal mood and affect.  Labs:    Imaging Studies: No results found.  Assessment and Plan:   Cynthia Houston is a 57 y.o. y/o female with abdominal pain in the right upper quadrant.  The patient had her abdominal wall muscles flexed with  raising her leg 6 inches above the exam table which did not reproduce the right-sided pain.  Shortly after the patient sat back down in the chair she started to report a burning mid abdominal pain consistent with musculoskeletal pain exacerbated by the leg lifting.  As for the nausea associated with the right upper quadrant pain I will have her set up for a gallbladder emptying study.  The patient has also been told about a low FODMAP diet for her bloating and cramps.  The patient will try the low FODMAP diet and has been explained the plan agrees with it.     Midge Minium, MD. Clementeen Graham    Note: This dictation was prepared with Dragon dictation along with smaller phrase technology. Any transcriptional errors that result from this process are unintentional.

## 2023-01-04 ENCOUNTER — Other Ambulatory Visit: Payer: Self-pay | Admitting: Internal Medicine

## 2023-01-10 ENCOUNTER — Ambulatory Visit
Admission: RE | Admit: 2023-01-10 | Discharge: 2023-01-10 | Disposition: A | Discharge status: Home / Self Care | Payer: Managed Care, Other (non HMO) | Source: Ambulatory Visit | Attending: Gastroenterology | Admitting: Gastroenterology

## 2023-01-10 DIAGNOSIS — R1011 Right upper quadrant pain: Secondary | ICD-10-CM | POA: Diagnosis not present

## 2023-01-10 MED ORDER — TECHNETIUM TC 99M MEBROFENIN IV KIT
5.0000 | PACK | Freq: Once | INTRAVENOUS | Status: AC | PRN
  Administered 2023-01-10: 5.13 via INTRAVENOUS

## 2023-01-14 ENCOUNTER — Encounter: Payer: Self-pay | Admitting: Gastroenterology

## 2023-01-14 DIAGNOSIS — R932 Abnormal findings on diagnostic imaging of liver and biliary tract: Secondary | ICD-10-CM

## 2023-01-16 NOTE — Addendum Note (Signed)
Addended by: Roena Malady on: 01/16/2023 08:15 AM   Modules accepted: Orders

## 2023-01-21 ENCOUNTER — Ambulatory Visit: Payer: Managed Care, Other (non HMO) | Admitting: Surgery

## 2023-01-21 ENCOUNTER — Encounter: Payer: Self-pay | Admitting: Surgery

## 2023-01-21 DIAGNOSIS — K828 Other specified diseases of gallbladder: Secondary | ICD-10-CM | POA: Diagnosis not present

## 2023-01-21 NOTE — Patient Instructions (Addendum)
You have requested to have your gallbladder removed. This will be done at [facility] with Dr. Pabon.  You will most likely be out of work 1-2 weeks for this surgery.  If you have FMLA or disability paperwork that needs filled out you may drop this off at our office or this can be faxed to [phone].  You will return after your post-op appointment with a lifting restriction for approximately 4 more weeks.  You will be able to eat anything you would like to following surgery. But, start by eating a bland diet and advance this as tolerated. The Gallbladder diet is below, please go as closely by this diet as possible prior to surgery to avoid any further attacks.  Please see the (blue)pre-care form that you have been given today. Our surgery scheduler will call you to verify surgery date and to go over information.   If you have any questions, please call our office.  Laparoscopic Cholecystectomy Laparoscopic cholecystectomy is surgery to remove the gallbladder. The gallbladder is located in the upper right part of the abdomen, behind the liver. It is a storage sac for bile, which is produced in the liver. Bile aids in the digestion and absorption of fats. Cholecystectomy is often done for inflammation of the gallbladder (cholecystitis). This condition is usually caused by a buildup of gallstones (cholelithiasis) in the gallbladder. Gallstones can block the flow of bile, and that can result in inflammation and pain. In severe cases, emergency surgery may be required. If emergency surgery is not required, you will have time to prepare for the procedure. Laparoscopic surgery is an alternative to open surgery. Laparoscopic surgery has a shorter recovery time. Your common bile duct may also need to be examined during the procedure. If stones are found in the common bile duct, they may be removed. LET YOUR HEALTH CARE PROVIDER KNOW ABOUT: Any allergies you have. All medicines you are taking,  including vitamins, herbs, eye drops, creams, and over-the-counter medicines. Previous problems you or members of your family have had with the use of anesthetics. Any blood disorders you have. Previous surgeries you have had.  Any medical conditions you have. RISKS AND COMPLICATIONS Generally, this is a safe procedure. However, problems may occur, including: Infection. Bleeding. Allergic reactions to medicines. Damage to other structures or organs. A stone remaining in the common bile duct. A bile leak from the cyst duct that is clipped when your gallbladder is removed. The need to convert to open surgery, which requires a larger incision in the abdomen. This may be necessary if your surgeon thinks that it is not safe to continue with a laparoscopic procedure. BEFORE THE PROCEDURE Ask your health care provider about: Changing or stopping your regular medicines. This is especially important if you are taking diabetes medicines or blood thinners. Taking medicines such as aspirin and ibuprofen. These medicines can thin your blood. Do not take these medicines before your procedure if your health care provider instructs you not to. Follow instructions from your health care provider about eating or drinking restrictions. Let your health care provider know if you develop a cold or an infection before surgery. Plan to have someone take you home after the procedure. Ask your health care provider how your surgical site will be marked or identified. You may be given antibiotic medicine to help prevent infection. PROCEDURE To reduce your risk of infection: Your health care team will wash or sanitize their hands. Your skin will be washed with soap. An IV   tube may be inserted into one of your veins. You will be given a medicine to make you fall asleep (general anesthetic). A breathing tube will be placed in your mouth. The surgeon will make several small cuts (incisions) in your abdomen. A thin,  lighted tube (laparoscope) that has a tiny camera on the end will be inserted through one of the small incisions. The camera on the laparoscope will send a picture to a TV screen (monitor) in the operating room. This will give the surgeon a good view inside your abdomen. A gas will be pumped into your abdomen. This will expand your abdomen to give the surgeon more room to perform the surgery. Other tools that are needed for the procedure will be inserted through the other incisions. The gallbladder will be removed through one of the incisions. After your gallbladder has been removed, the incisions will be closed with stitches (sutures), staples, or skin glue. Your incisions may be covered with a bandage (dressing). The procedure may vary among health care providers and hospitals. AFTER THE PROCEDURE Your blood pressure, heart rate, breathing rate, and blood oxygen level will be monitored often until the medicines you were given have worn off. You will be given medicines as needed to control your pain.   This information is not intended to replace advice given to you by your health care provider. Make sure you discuss any questions you have with your health care provider.   Document Released: 06/11/2005 Document Revised: 03/02/2015 Document Reviewed: 01/21/2013 Elsevier Interactive Patient Education 2016 [employer]   Low-Fat Diet for Gallbladder Conditions A low-fat diet can be helpful if you have pancreatitis or a gallbladder condition. With these conditions, your pancreas and gallbladder have trouble digesting fats. A healthy eating plan with less fat will help rest your pancreas and gallbladder and reduce your symptoms. WHAT DO I NEED TO KNOW ABOUT THIS DIET? Eat a low-fat diet. Reduce your fat intake to less than 20-30% of your total daily calories. This is less than 50-60 g of fat per day. Remember that you need some fat in your diet. Ask your dietician what your daily goal should  be. Choose nonfat and low-fat healthy foods. Look for the words "nonfat," "low fat," or "fat free." As a guide, look on the label and choose foods with less than 3 g of fat per serving. Eat only one serving. Avoid alcohol. Do not smoke. If you need help quitting, talk with your health care provider. Eat small frequent meals instead of three large heavy meals. WHAT FOODS CAN I EAT? Grains Include healthy grains and starches such as potatoes, wheat bread, fiber-rich cereal, and brown rice. Choose whole grain options whenever possible. In adults, whole grains should account for 45-65% of your daily calories.  Fruits and Vegetables Eat plenty of fruits and vegetables. Fresh fruits and vegetables add fiber to your diet. Meats and Other Protein Sources Eat lean meat such as chicken and pork. Trim any fat off of meat before cooking it. Eggs, fish, and beans are other sources of protein. In adults, these foods should account for 10-35% of your daily calories. Dairy Choose low-fat milk and dairy options. Dairy includes fat and protein, as well as calcium.  Fats and Oils Limit high-fat foods such as fried foods, sweets, baked goods, sugary drinks.  Other Creamy sauces and condiments, such as mayonnaise, can add extra fat. Think about whether or not you need to use them, or use smaller amounts or low fat options.   WHAT FOODS ARE NOT RECOMMENDED? High fat foods, such as: Baked goods. Ice cream. French toast. Sweet rolls. Pizza. Cheese bread. Foods covered with batter, butter, creamy sauces, or cheese. Fried foods. Sugary drinks and desserts. Foods that cause gas or bloating   This information is not intended to replace advice given to you by your health care provider. Make sure you discuss any questions you have with your health care provider.   Document Released: 06/16/2013 Document Reviewed: 06/16/2013 Elsevier Interactive Patient Education 2016 [employer] Inc.   

## 2023-01-22 ENCOUNTER — Encounter: Payer: Self-pay | Admitting: Surgery

## 2023-01-22 ENCOUNTER — Encounter: Payer: Managed Care, Other (non HMO) | Admitting: Internal Medicine

## 2023-01-22 NOTE — Progress Notes (Signed)
Patient ID: Cynthia Houston, female   DOB: [birth year], 57 y.o.   MRN: [id number]  HPI Cynthia Houston is a 57 y.o. female in consultation at the request of Dr.Wohl .  She does have some chronic abdominal pain and chronic functional GI issues to include GERD, irritable bowel syndrome and constipation. She reports right upper quadrant pain worsening after certain meals.  She also reports some nausea. She did have a prior history of hysterectomy, tubal and dx laparoscopy . Also had an ultrasound that I have personally reviewed showing no evidence of gallstones no evidence of cholecystitis.  Sequently underwent a HIDA scan that I have also reviewed showing low ejection fraction measuring 34%.  The NML was 33%.  Please also note that the patient did have some reproduction of symptoms after ingestion of Ensure. Denies any fevers or jaundice. Able to perform more than 4 METS w/o SOB or CP. Multiple family member with gallbladder problems that responded to cholecystectomy.    HPI  Past Medical History:  Diagnosis Date   Chicken pox    Depression    Diffuse cystic mastopathy 2013   Family history of adverse reaction to anesthesia    Mother - PONV   Headache(784.0)    Hx of dysplastic nevus 03/07/2007   right lateral thigh, slight to mod   Osteoarthritis    right hand   PONV (postoperative nausea and vomiting)    Tendinitis    right elbow   Urinary tract infection     Past Surgical History:  Procedure Laterality Date   ABDOMINAL HYSTERECTOMY  2008   supracervical    AIKEN OSTEOTOMY Left 07/16/2019   Procedure: PHALANX OSTEOTOMY AKIN;  Surgeon: Recardo Evangelist, DPM;  Location: [facility];  Service: Podiatry;  Laterality: Left;   BREAST CYST ASPIRATION Right    neg   BUNIONECTOMY Right 2011   BUNIONECTOMY Left 07/16/2019   Procedure: LAPIDUS-TYPE;  Surgeon: Recardo Evangelist, DPM;  Location: [facility];  Service: Podiatry;  Laterality: Left;  LMA WITH BLOCK   COLONOSCOPY   2008   Dr. Mechele Collin   COLONOSCOPY WITH PROPOFOL N/A 05/24/2020   Procedure: COLONOSCOPY WITH PROPOFOL;  Surgeon: Midge Minium, MD;  Location: [facility];  Service: Endoscopy;  Laterality: N/A;   DIAGNOSTIC LAPAROSCOPY     ECTOPIC PREGNANCY SURGERY  1989   gynecological surgery   2004   TUBAL LIGATION     UTERINE FIBROID SURGERY  2002   x2 2004   WEIL OSTEOTOMY Left 07/16/2019   Procedure: WEIL OSTEOTOMY;  Surgeon: Recardo Evangelist, DPM;  Location: [facility];  Service: Podiatry;  Laterality: Left;    Family History  Problem Relation Age of Onset   Hyperlipidemia Mother    Hypertension Mother    Arthritis Mother        rheumatoid arthritis   Crohn's disease Father    Heart disease Father 21       CABG   Hyperlipidemia Father    Hypertension Father    Breast cancer Neg Hx     Social History Social History   Tobacco Use   Smoking status: Never    Passive exposure: Never   Smokeless tobacco: Never  Vaping Use   Vaping status: Never Used  Substance Use Topics   Alcohol use: Yes    Alcohol/week: 2.0 standard drinks of alcohol    Types: 2 Glasses of wine per week   Drug use: No    Allergies  Allergen Reactions   Sulfa  Antibiotics Rash        Fiorinal [Butalbital-Aspirin-Caffeine] Nausea And Vomiting    Also drop in BP   Percocet [Oxycodone-Acetaminophen] Nausea And Vomiting    Also drop in BP   Augmentin [Amoxicillin-Pot Clavulanate] Rash   Doxycycline Rash    Current Outpatient Medications  Medication Sig Dispense Refill   omeprazole (PRILOSEC) 20 MG capsule Take 1 capsule (20 mg total) by mouth daily. Take 1 hour to 30 minutes prior to breakfast (Patient taking differently: Take 20 mg by mouth as needed. Take 1 hour to 30 minutes prior to breakfast) 30 capsule 3   sertraline (ZOLOFT) 50 MG tablet TAKE 1 TABLET BY MOUTH DAILY 90 tablet 1   No current facility-administered medications for this visit.     Review of Systems Full ROS  was asked and  was negative except for the information on the HPI  Physical Exam Blood pressure 110/70, pulse 61, temperature 98 F (36.7 C), height 5\' 3"  (1.6 m), weight 122 lb (55.3 kg), SpO2 99%. CONSTITUTIONAL: NAD. EYES: Pupils are equal, round, Sclera are non-icteric. EARS, NOSE, MOUTH AND THROAT:  The oral mucosa is pink and moist. Hearing is intact to voice. LYMPH NODES:  Lymph nodes in the neck are normal. RESPIRATORY:  Lungs are clear. There is normal respiratory effort, with equal breath sounds bilaterally, and without pathologic use of accessory muscles. CARDIOVASCULAR: Heart is regular without murmurs, gallops, or rubs. GI: The abdomen is  soft, nontender, and nondistended. There are no palpable masses. There is no hepatosplenomegaly. There are normal bowel sounds in all quadrants. GU: Rectal deferred.   MUSCULOSKELETAL: Normal muscle strength and tone. No cyanosis or edema.   SKIN: Turgor is good and there are no pathologic skin lesions or ulcers. NEUROLOGIC: Motor and sensation is grossly normal. Cranial nerves are grossly intact. PSYCH:  Oriented to person, place and time. Affect is normal.  Data Reviewed  I have personally reviewed the patient's imaging, laboratory findings and medical records.    Assessment/Plan 57 year old female with right upper quadrant pain consistent with aortic colic.  HIDA scan definitely suggest biliary dyskinesia and her symptoms are consistent with biliary disease.  I had an extensive discussion with the patient regarding the diagnosis of biliary dyskinesia and its potential as loss.  She does have some functional GI issues that may play as a confounding factor.  Discussed with her about options of pursuing further endoscopic and GI workup.  Another option is to be proceed with cholecystectomy.  I did discuss with her that obviously cholecystectomy will not address IBS symptoms. The risks, benefits, complications, treatment options, and expected outcomes were  discussed with the patient. The possibilities of bleeding, recurrent infection, finding a normal gallbladder, perforation of viscus organs, damage to surrounding structures, bile leak, abscess formation, needing a drain placed, the need for additional procedures, reaction to medication, pulmonary aspiration,  failure to diagnose a condition, the possible need to convert to an open procedure, and creating a complication requiring transfusion or operation were discussed with the patient. The patient and/or family concurred with the proposed plan, giving informed consent.  I spent 55 minutes in this encounter including personally reviewing imaging studies, medical records, coordinating  her care, counseling the patient, placing orders and performing appropriate documentation. A copy of this report was sent to the referring provider  Sterling Big, MD FACS General Surgeon 01/22/2023, 4:06 PM

## 2023-01-22 NOTE — H&P (View-Only) (Signed)
 Patient ID: Cynthia Houston, female   DOB: [birth year], 57 y.o.   MRN: [id number]  HPI Cynthia Houston is a 57 y.o. female in consultation at the request of Dr.Wohl .  She does have some chronic abdominal pain and chronic functional GI issues to include GERD, irritable bowel syndrome and constipation. She reports right upper quadrant pain worsening after certain meals.  She also reports some nausea. She did have a prior history of hysterectomy, tubal and dx laparoscopy . Also had an ultrasound that I have personally reviewed showing no evidence of gallstones no evidence of cholecystitis.  Sequently underwent a HIDA scan that I have also reviewed showing low ejection fraction measuring 34%.  The NML was 33%.  Please also note that the patient did have some reproduction of symptoms after ingestion of Ensure. Denies any fevers or jaundice. Able to perform more than 4 METS w/o SOB or CP. Multiple family member with gallbladder problems that responded to cholecystectomy.    HPI  Past Medical History:  Diagnosis Date   Chicken pox    Depression    Diffuse cystic mastopathy 2013   Family history of adverse reaction to anesthesia    Mother - PONV   Headache(784.0)    Hx of dysplastic nevus 03/07/2007   right lateral thigh, slight to mod   Osteoarthritis    right hand   PONV (postoperative nausea and vomiting)    Tendinitis    right elbow   Urinary tract infection     Past Surgical History:  Procedure Laterality Date   ABDOMINAL HYSTERECTOMY  2008   supracervical    AIKEN OSTEOTOMY Left 07/16/2019   Procedure: PHALANX OSTEOTOMY AKIN;  Surgeon: Recardo Evangelist, DPM;  Location: [facility];  Service: Podiatry;  Laterality: Left;   BREAST CYST ASPIRATION Right    neg   BUNIONECTOMY Right 2011   BUNIONECTOMY Left 07/16/2019   Procedure: LAPIDUS-TYPE;  Surgeon: Recardo Evangelist, DPM;  Location: [facility];  Service: Podiatry;  Laterality: Left;  LMA WITH BLOCK   COLONOSCOPY   2008   Dr. Mechele Collin   COLONOSCOPY WITH PROPOFOL N/A 05/24/2020   Procedure: COLONOSCOPY WITH PROPOFOL;  Surgeon: Midge Minium, MD;  Location: [facility];  Service: Endoscopy;  Laterality: N/A;   DIAGNOSTIC LAPAROSCOPY     ECTOPIC PREGNANCY SURGERY  1989   gynecological surgery   2004   TUBAL LIGATION     UTERINE FIBROID SURGERY  2002   x2 2004   WEIL OSTEOTOMY Left 07/16/2019   Procedure: WEIL OSTEOTOMY;  Surgeon: Recardo Evangelist, DPM;  Location: [facility];  Service: Podiatry;  Laterality: Left;    Family History  Problem Relation Age of Onset   Hyperlipidemia Mother    Hypertension Mother    Arthritis Mother        rheumatoid arthritis   Crohn's disease Father    Heart disease Father 21       CABG   Hyperlipidemia Father    Hypertension Father    Breast cancer Neg Hx     Social History Social History   Tobacco Use   Smoking status: Never    Passive exposure: Never   Smokeless tobacco: Never  Vaping Use   Vaping status: Never Used  Substance Use Topics   Alcohol use: Yes    Alcohol/week: 2.0 standard drinks of alcohol    Types: 2 Glasses of wine per week   Drug use: No    Allergies  Allergen Reactions   Sulfa  Antibiotics Rash        Fiorinal [Butalbital-Aspirin-Caffeine] Nausea And Vomiting    Also drop in BP   Percocet [Oxycodone-Acetaminophen] Nausea And Vomiting    Also drop in BP   Augmentin [Amoxicillin-Pot Clavulanate] Rash   Doxycycline Rash    Current Outpatient Medications  Medication Sig Dispense Refill   omeprazole (PRILOSEC) 20 MG capsule Take 1 capsule (20 mg total) by mouth daily. Take 1 hour to 30 minutes prior to breakfast (Patient taking differently: Take 20 mg by mouth as needed. Take 1 hour to 30 minutes prior to breakfast) 30 capsule 3   sertraline (ZOLOFT) 50 MG tablet TAKE 1 TABLET BY MOUTH DAILY 90 tablet 1   No current facility-administered medications for this visit.     Review of Systems Full ROS  was asked and  was negative except for the information on the HPI  Physical Exam Blood pressure 110/70, pulse 61, temperature 98 F (36.7 C), height 5\' 3"  (1.6 m), weight 122 lb (55.3 kg), SpO2 99%. CONSTITUTIONAL: NAD. EYES: Pupils are equal, round, Sclera are non-icteric. EARS, NOSE, MOUTH AND THROAT:  The oral mucosa is pink and moist. Hearing is intact to voice. LYMPH NODES:  Lymph nodes in the neck are normal. RESPIRATORY:  Lungs are clear. There is normal respiratory effort, with equal breath sounds bilaterally, and without pathologic use of accessory muscles. CARDIOVASCULAR: Heart is regular without murmurs, gallops, or rubs. GI: The abdomen is  soft, nontender, and nondistended. There are no palpable masses. There is no hepatosplenomegaly. There are normal bowel sounds in all quadrants. GU: Rectal deferred.   MUSCULOSKELETAL: Normal muscle strength and tone. No cyanosis or edema.   SKIN: Turgor is good and there are no pathologic skin lesions or ulcers. NEUROLOGIC: Motor and sensation is grossly normal. Cranial nerves are grossly intact. PSYCH:  Oriented to person, place and time. Affect is normal.  Data Reviewed  I have personally reviewed the patient's imaging, laboratory findings and medical records.    Assessment/Plan 57 year old female with right upper quadrant pain consistent with aortic colic.  HIDA scan definitely suggest biliary dyskinesia and her symptoms are consistent with biliary disease.  I had an extensive discussion with the patient regarding the diagnosis of biliary dyskinesia and its potential as loss.  She does have some functional GI issues that may play as a confounding factor.  Discussed with her about options of pursuing further endoscopic and GI workup.  Another option is to be proceed with cholecystectomy.  I did discuss with her that obviously cholecystectomy will not address IBS symptoms. The risks, benefits, complications, treatment options, and expected outcomes were  discussed with the patient. The possibilities of bleeding, recurrent infection, finding a normal gallbladder, perforation of viscus organs, damage to surrounding structures, bile leak, abscess formation, needing a drain placed, the need for additional procedures, reaction to medication, pulmonary aspiration,  failure to diagnose a condition, the possible need to convert to an open procedure, and creating a complication requiring transfusion or operation were discussed with the patient. The patient and/or family concurred with the proposed plan, giving informed consent.  I spent 55 minutes in this encounter including personally reviewing imaging studies, medical records, coordinating  her care, counseling the patient, placing orders and performing appropriate documentation. A copy of this report was sent to the referring provider  Sterling Big, MD FACS General Surgeon 01/22/2023, 4:06 PM

## 2023-01-23 ENCOUNTER — Telehealth: Payer: Self-pay | Admitting: Surgery

## 2023-01-23 NOTE — Telephone Encounter (Signed)
Patient has been advised of Pre-Admission date/time, and Surgery date at [facility].  Surgery Date: 02/07/23 Preadmission Testing Date: 01/30/23 (phone 1p-4p)  Patient has been made aware to call [phone], between 1-3:00pm the day before surgery, to find out what time to arrive for surgery.

## 2023-01-24 ENCOUNTER — Ambulatory Visit (INDEPENDENT_AMBULATORY_CARE_PROVIDER_SITE_OTHER): Payer: Managed Care, Other (non HMO) | Admitting: Internal Medicine

## 2023-01-24 ENCOUNTER — Encounter: Payer: Self-pay | Admitting: Internal Medicine

## 2023-01-24 VITALS — BP 102/68 | HR 67 | Temp 97.7°F | Ht 63.0 in | Wt 120.6 lb

## 2023-01-24 DIAGNOSIS — R7401 Elevation of levels of liver transaminase levels: Secondary | ICD-10-CM

## 2023-01-24 DIAGNOSIS — Z0000 Encounter for general adult medical examination without abnormal findings: Secondary | ICD-10-CM

## 2023-01-24 DIAGNOSIS — R7301 Impaired fasting glucose: Secondary | ICD-10-CM | POA: Diagnosis not present

## 2023-01-24 DIAGNOSIS — E785 Hyperlipidemia, unspecified: Secondary | ICD-10-CM

## 2023-01-24 DIAGNOSIS — R5383 Other fatigue: Secondary | ICD-10-CM

## 2023-01-24 DIAGNOSIS — Z1231 Encounter for screening mammogram for malignant neoplasm of breast: Secondary | ICD-10-CM

## 2023-01-24 NOTE — Progress Notes (Signed)
Patient ID: Cynthia Houston, female    DOB: 03-27-1966  Age: 57 y.o. MRN: [id number]  The patient is here for annual preventive examination and management of other chronic and acute problems.   The risk factors are reflected in the social history.   The roster of all physicians providing medical care to patient - is listed in the Snapshot section of the chart.   Activities of daily living:  The patient is 100% independent in all ADLs: dressing, toileting, feeding as well as independent mobility   Home safety : The patient has smoke detectors in the home. They wear seatbelts.  There are no unsecured firearms at home. There is no violence in the home.    There is no risks for hepatitis, STDs or HIV. There is no   history of blood transfusion. They have no travel history to infectious disease endemic areas of the world.   The patient has seen their dentist in the last six month. They have seen their eye doctor in the last year. The patinet  denies slight hearing difficulty with regard to whispered voices and some television programs.  They have deferred audiologic testing in the last year.  They do not  have excessive sun exposure. Discussed the need for sun protection: hats, long sleeves and use of sunscreen if there is significant sun exposure.    Diet: the importance of a healthy diet is discussed. They do have a healthy diet.   The benefits of regular aerobic exercise were discussed. The patient  exercises  3 to 5 days per week  for  60 minutes.    Depression screen: there are no signs or vegative symptoms of depression- irritability, change in appetite, anhedonia, sadness/tearfullness.   The following portions of the patient's history were reviewed and updated as appropriate: allergies, current medications, past family history, past medical history,  past surgical history, past social history  and problem list.   Visual acuity was not assessed per patient preference since the patient has  regular follow up with an  ophthalmologist. Hearing and body mass index were assessed and reviewed.    During the course of the visit the patient was educated and counseled about appropriate screening and preventive services including : fall prevention , diabetes screening, nutrition counseling, colorectal cancer screening, and recommended immunizations.    Chief Complaint:   1) recurrent abdominal pain:  GI workup commenced in March by NP Arnette resulting in GI referral,  HIDA scan (low end of normal E 34%)  and elective GB surgery planned for mid August by [facility].  Prior noninvasive cardiac workup normal  (coronary calcium score zero April 2024   Review of Symptoms  Patient denies headache, fevers, malaise, unintentional weight loss, skin rash, eye pain, sinus congestion and sinus pain, sore throat, dysphagia,  hemoptysis , cough, dyspnea, wheezing, chest pain, palpitations, orthopnea, edema, abdominal pain, nausea, melena, diarrhea, constipation, flank pain, dysuria, hematuria, urinary  Frequency, nocturia, numbness, tingling, seizures,  Focal weakness, Loss of consciousness,  Tremor, insomnia, depression, anxiety, and suicidal ideation.    Physical Exam:  BP 102/68   Pulse 67   Temp 97.7 F (36.5 C) (Oral)   Ht 5\' 3"  (1.6 m)   Wt 120 lb 9.6 oz (54.7 kg)   SpO2 99%   BMI 21.36 kg/m    Physical Exam Vitals reviewed.  Constitutional:      General: She is not in acute distress.    Appearance: Normal appearance. She is well-developed and normal  weight. She is not ill-appearing, toxic-appearing or diaphoretic.  HENT:     Head: Normocephalic.     Right Ear: Tympanic membrane, ear canal and external ear normal. There is no impacted cerumen.     Left Ear: Tympanic membrane, ear canal and external ear normal. There is no impacted cerumen.     Nose: Nose normal.     Mouth/Throat:     Mouth: Mucous membranes are moist.     Pharynx: Oropharynx is clear.  Eyes:     General: No scleral  icterus.       Right eye: No discharge.        Left eye: No discharge.     Conjunctiva/sclera: Conjunctivae normal.     Pupils: Pupils are equal, round, and reactive to light.  Neck:     Thyroid: No thyromegaly.     Vascular: No carotid bruit or JVD.  Cardiovascular:     Rate and Rhythm: Normal rate and regular rhythm.     Heart sounds: Normal heart sounds.  Pulmonary:     Effort: Pulmonary effort is normal. No respiratory distress.     Breath sounds: Normal breath sounds.  Chest:  Breasts:    Breasts are symmetrical.     Right: Normal. No swelling, inverted nipple, mass, nipple discharge, skin change or tenderness.     Left: Normal. No swelling, inverted nipple, mass, nipple discharge, skin change or tenderness.  Abdominal:     General: Bowel sounds are normal.     Palpations: Abdomen is soft. There is no mass.     Tenderness: There is no abdominal tenderness. There is no guarding or rebound.  Musculoskeletal:        General: Normal range of motion.     Cervical back: Normal range of motion and neck supple.  Lymphadenopathy:     Cervical: No cervical adenopathy.     Upper Body:     Right upper body: No supraclavicular, axillary or pectoral adenopathy.     Left upper body: No supraclavicular, axillary or pectoral adenopathy.  Skin:    General: Skin is warm and dry.  Neurological:     General: No focal deficit present.     Mental Status: She is alert and oriented to person, place, and time. Mental status is at baseline.  Psychiatric:        Mood and Affect: Mood normal.        Behavior: Behavior normal.        Thought Content: Thought content normal.        Judgment: Judgment normal.    Assessment and Plan: Encounter for screening mammogram for malignant neoplasm of breast -     3D Screening Mammogram, Left and Right; Future  Hyperlipidemia, unspecified hyperlipidemia type -     LDL cholesterol, direct -     Lipid panel; Future -     TSH; Future  Impaired fasting  glucose -     Hemoglobin A1c; Future  Other fatigue -     CBC with Differential/Platelet; Future -     Comprehensive metabolic panel; Future  Elevated ALT measurement Assessment & Plan: Chronic  . Prior noninvasive workup negative .  Biliary dyskinesia suspected with low normal HIDA scan For lap chole in mid August to hopefully resolve RUQ pain    Encounter for preventive health examination Assessment & Plan: age appropriate education and counseling updated, referrals for preventative services and immunizations addressed, dietary and smoking counseling addressed, most recent labs reviewed.  I  have personally reviewed and have noted:   1) the patient's medical and social history 2) The pt's use of alcohol, tobacco, and illicit drugs 3) The patient's current medications and supplements 4) Functional ability including ADL's, fall risk, home safety risk, hearing and visual impairment 5) Diet and physical activities 6) Evidence for depression or mood disorder 7) The patient's height, weight, and BMI have been recorded in the chart   I have made referrals, and provided counseling and education based on review of the above      Return in about 4 weeks (around 02/21/2023).  Sherlene Shams, MD

## 2023-01-24 NOTE — Patient Instructions (Addendum)
Your annual mammogram has been ordered and is due in November .  [facility] will not allow [place] to schedule it for you,  so please  call to make your appointment [phone]    Praying the lap chole resolves your symptoms.  You are in good hands with Dr Everlene Farrier   I will complete your Biometric screening and notify you when the form is faxed.

## 2023-01-25 ENCOUNTER — Other Ambulatory Visit: Payer: Managed Care, Other (non HMO)

## 2023-01-26 NOTE — Assessment & Plan Note (Signed)

## 2023-01-26 NOTE — Assessment & Plan Note (Signed)
Chronic  . Prior noninvasive workup negative .  Biliary dyskinesia suspected with low normal HIDA scan For lap chole in mid August to hopefully resolve RUQ pain

## 2023-01-28 ENCOUNTER — Other Ambulatory Visit (INDEPENDENT_AMBULATORY_CARE_PROVIDER_SITE_OTHER): Payer: Managed Care, Other (non HMO)

## 2023-01-28 DIAGNOSIS — E785 Hyperlipidemia, unspecified: Secondary | ICD-10-CM

## 2023-01-28 DIAGNOSIS — R7301 Impaired fasting glucose: Secondary | ICD-10-CM

## 2023-01-28 DIAGNOSIS — R5383 Other fatigue: Secondary | ICD-10-CM

## 2023-01-30 ENCOUNTER — Encounter
Admission: RE | Admit: 2023-01-30 | Discharge: 2023-01-30 | Disposition: A | Discharge status: Home / Self Care | Payer: Managed Care, Other (non HMO) | Source: Ambulatory Visit | Attending: Surgery | Admitting: Surgery

## 2023-01-30 HISTORY — DX: Gastro-esophageal reflux disease without esophagitis: K21.9

## 2023-01-30 HISTORY — DX: Paroxysmal tachycardia, unspecified: I47.9

## 2023-01-30 NOTE — Patient Instructions (Addendum)
Your procedure is scheduled on: Thursday, August 15 Report to the Registration Desk on the 1st floor of the [facility] To find out your arrival time, please call [phone] between 1PM - 3PM on: Wednesday, August 14 If your arrival time is 6:00 am, do not arrive before that time as the [facility] entrance doors do not open until 6:00 am.  REMEMBER: Instructions that are not followed completely may result in serious medical risk, up to and including death; or upon the discretion of your surgeon and anesthesiologist your surgery may need to be rescheduled.  Do not eat food after midnight the night before surgery.  No gum chewing or hard candies.  You may however, drink CLEAR liquids up to 2 hours before you are scheduled to arrive for your surgery. Do not drink anything within 2 hours of your scheduled arrival time.  Clear liquids include: - water  - apple juice without pulp - gatorade (not RED colors) - black coffee or tea (Do NOT add milk or creamers to the coffee or tea) Do NOT drink anything that is not on this list.  One week prior to surgery: starting August 8 Stop Anti-inflammatories (NSAIDS) such as Advil, Aleve, Ibuprofen, Motrin, Naproxen, Naprosyn and Aspirin based products such as Excedrin, Goody's Powder, BC Powder. Stop ANY OVER THE COUNTER supplements until after surgery. You may however, continue to take Tylenol if needed for pain up until the day of surgery.  Continue taking all prescribed medications   DO NOT TAKE ANY MEDICATIONS THE MORNING OF SURGERY   No Alcohol for 24 hours before or after surgery.  No Smoking including e-cigarettes for 24 hours before surgery.  No chewable tobacco products for at least 6 hours before surgery.  No nicotine patches on the day of surgery.  Do not use any "recreational" drugs for at least a week (preferably 2 weeks) before your surgery.  Please be advised that the combination of cocaine and anesthesia may have negative  outcomes, up to and including death. If you test positive for cocaine, your surgery will be cancelled.  On the morning of surgery brush your teeth with toothpaste and water, you may rinse your mouth with mouthwash if you wish. Do not swallow any toothpaste or mouthwash.  Use CHG Soap as directed on instruction sheet.  Do not wear jewelry, make-up, hairpins, clips or nail polish.  Do not wear lotions, powders, or perfumes.   Do not shave body hair from the neck down 48 hours before surgery.  Contact lenses, hearing aids and dentures may not be worn into surgery.  Do not bring valuables to the hospital. [facility] is not responsible for any missing/lost belongings or valuables.   Notify your doctor if there is any change in your medical condition (cold, fever, infection).  Wear comfortable clothing (specific to your surgery type) to the hospital.  After surgery, you can help prevent lung complications by doing breathing exercises.  Take deep breaths and cough every 1-2 hours. Your doctor may order a device called an Incentive Spirometer to help you take deep breaths. When coughing or sneezing, hold a pillow firmly against your incision with both hands. This is called "splinting." Doing this helps protect your incision. It also decreases belly discomfort.  If you are being discharged the day of surgery, you will not be allowed to drive home. You will need a responsible individual to drive you home and stay with you for 24 hours after surgery.   If you  are taking public transportation, you will need to have a responsible individual with you.  Please call the Pre-admissions Testing Dept. at [phone] if you have any questions about these instructions.  Surgery Visitation Policy:  Patients having surgery or a procedure may have two visitors.  Children under the age of 67 must have an adult with them who is not the patient.     Preparing for Surgery with CHLORHEXIDINE  GLUCONATE (CHG) Soap  Chlorhexidine Gluconate (CHG) Soap  o An antiseptic cleaner that kills germs and bonds with the skin to continue killing germs even after washing  o Used for showering the night before surgery and morning of surgery  Before surgery, you can play an important role by reducing the number of germs on your skin.  CHG (Chlorhexidine gluconate) soap is an antiseptic cleanser which kills germs and bonds with the skin to continue killing germs even after washing.  Please do not use if you have an allergy to CHG or antibacterial soaps. If your skin becomes reddened/irritated stop using the CHG.  1. Shower the NIGHT BEFORE SURGERY and the MORNING OF SURGERY with CHG soap.  2. If you choose to wash your hair, wash your hair first as usual with your normal shampoo.  3. After shampooing, rinse your hair and body thoroughly to remove the shampoo.  4. Use CHG as you would any other liquid soap. You can apply CHG directly to the skin and wash gently with a scrungie or a clean washcloth.  5. Apply the CHG soap to your body only from the neck down. Do not use on open wounds or open sores. Avoid contact with your eyes, ears, mouth, and genitals (private parts). Wash face and genitals (private parts) with your normal soap.  6. Wash thoroughly, paying special attention to the area where your surgery will be performed.  7. Thoroughly rinse your body with warm water.  8. Do not shower/wash with your normal soap after using and rinsing off the CHG soap.  9. Pat yourself dry with a clean towel.  10. Wear clean pajamas to bed the night before surgery.  12. Place clean sheets on your bed the night of your first shower and do not sleep with pets.  13. Shower again with the CHG soap on the day of surgery prior to arriving at the hospital.  14. Do not apply any deodorants/lotions/powders.  15. Please wear clean clothes to the hospital.

## 2023-02-06 MED ORDER — FAMOTIDINE 20 MG PO TABS
20.0000 mg | ORAL_TABLET | Freq: Once | ORAL | Status: AC
  Administered 2023-02-07: 20 mg via ORAL

## 2023-02-06 MED ORDER — CHLORHEXIDINE GLUCONATE CLOTH 2 % EX PADS
6.0000 | MEDICATED_PAD | Freq: Once | CUTANEOUS | Status: AC
  Administered 2023-02-07: 6 via TOPICAL

## 2023-02-06 MED ORDER — INDOCYANINE GREEN 25 MG IV SOLR
2.5000 mg | Freq: Once | INTRAVENOUS | Status: AC
  Administered 2023-02-07: 2.5 mg via INTRAVENOUS

## 2023-02-06 MED ORDER — CHLORHEXIDINE GLUCONATE 0.12 % MT SOLN
15.0000 mL | Freq: Once | OROMUCOSAL | Status: AC
  Administered 2023-02-07: 15 mL via OROMUCOSAL

## 2023-02-06 MED ORDER — ORAL CARE MOUTH RINSE: 15.0000 mL | Freq: Once | OROMUCOSAL | Status: AC

## 2023-02-06 MED ORDER — CEFAZOLIN SODIUM-DEXTROSE 2-4 GM/100ML-% IV SOLN: 2.0000 g | INTRAVENOUS | Status: DC

## 2023-02-06 MED ORDER — GABAPENTIN 300 MG PO CAPS
300.0000 mg | ORAL_CAPSULE | ORAL | Status: AC
  Administered 2023-02-07: 300 mg via ORAL

## 2023-02-06 MED ORDER — LACTATED RINGERS IV SOLN: INTRAVENOUS | Status: DC

## 2023-02-06 MED ORDER — ACETAMINOPHEN 500 MG PO TABS
1000.0000 mg | ORAL_TABLET | ORAL | Status: AC
  Administered 2023-02-07: 1000 mg via ORAL

## 2023-02-07 ENCOUNTER — Ambulatory Visit: Payer: Managed Care, Other (non HMO) | Admitting: Anesthesiology

## 2023-02-07 ENCOUNTER — Other Ambulatory Visit: Payer: Self-pay

## 2023-02-07 ENCOUNTER — Encounter: Payer: Self-pay | Admitting: Surgery

## 2023-02-07 ENCOUNTER — Ambulatory Visit
Admission: RE | Admit: 2023-02-07 | Discharge: 2023-02-07 | Disposition: A | Discharge status: Home / Self Care | Payer: Managed Care, Other (non HMO) | Attending: Surgery | Admitting: Surgery

## 2023-02-07 ENCOUNTER — Encounter
Admission: RE | Disposition: A | Discharge status: Home / Self Care | Payer: Self-pay | Source: Home / Self Care | Attending: Surgery

## 2023-02-07 DIAGNOSIS — K828 Other specified diseases of gallbladder: Secondary | ICD-10-CM

## 2023-02-07 DIAGNOSIS — K581 Irritable bowel syndrome with constipation: Secondary | ICD-10-CM | POA: Diagnosis not present

## 2023-02-07 DIAGNOSIS — K219 Gastro-esophageal reflux disease without esophagitis: Secondary | ICD-10-CM | POA: Insufficient documentation

## 2023-02-07 DIAGNOSIS — Z90710 Acquired absence of both cervix and uterus: Secondary | ICD-10-CM | POA: Diagnosis not present

## 2023-02-07 DIAGNOSIS — F32A Depression, unspecified: Secondary | ICD-10-CM | POA: Insufficient documentation

## 2023-02-07 DIAGNOSIS — K811 Chronic cholecystitis: Secondary | ICD-10-CM | POA: Diagnosis not present

## 2023-02-07 DIAGNOSIS — K8050 Calculus of bile duct without cholangitis or cholecystitis without obstruction: Secondary | ICD-10-CM | POA: Diagnosis present

## 2023-02-07 DIAGNOSIS — K8044 Calculus of bile duct with chronic cholecystitis without obstruction: Secondary | ICD-10-CM | POA: Diagnosis not present

## 2023-02-07 SURGERY — CHOLECYSTECTOMY, ROBOT-ASSISTED, LAPAROSCOPIC
Anesthesia: General | Site: Abdomen

## 2023-02-07 MED ORDER — OXYCODONE HCL 5 MG/5ML PO SOLN: 5.0000 mg | Freq: Once | ORAL | Status: AC | PRN

## 2023-02-07 MED ORDER — MIDAZOLAM HCL 2 MG/2ML IJ SOLN
INTRAMUSCULAR | Status: DC | PRN
  Administered 2023-02-07: 2 mg via INTRAVENOUS

## 2023-02-07 MED ORDER — KETAMINE HCL 10 MG/ML IJ SOLN
INTRAMUSCULAR | Status: DC | PRN
  Administered 2023-02-07: 20 mg via INTRAVENOUS

## 2023-02-07 MED ORDER — FAMOTIDINE 20 MG PO TABS
ORAL_TABLET | ORAL | Status: AC
  Filled 2023-02-07: qty 1

## 2023-02-07 MED ORDER — FENTANYL CITRATE (PF) 100 MCG/2ML IJ SOLN
INTRAMUSCULAR | Status: DC | PRN
  Administered 2023-02-07: 25 ug via INTRAVENOUS
  Administered 2023-02-07: 50 ug via INTRAVENOUS

## 2023-02-07 MED ORDER — DEXAMETHASONE SODIUM PHOSPHATE 10 MG/ML IJ SOLN
INTRAMUSCULAR | Status: DC | PRN
  Administered 2023-02-07: 10 mg via INTRAVENOUS

## 2023-02-07 MED ORDER — ACETAMINOPHEN 500 MG PO TABS
ORAL_TABLET | ORAL | Status: AC
  Filled 2023-02-07: qty 2

## 2023-02-07 MED ORDER — FENTANYL CITRATE (PF) 100 MCG/2ML IJ SOLN: 25.0000 ug | INTRAMUSCULAR | Status: DC | PRN

## 2023-02-07 MED ORDER — GABAPENTIN 300 MG PO CAPS
ORAL_CAPSULE | ORAL | Status: AC
  Filled 2023-02-07: qty 1

## 2023-02-07 MED ORDER — HYDROCODONE-ACETAMINOPHEN 5-325 MG PO TABS: 1.0000 | ORAL_TABLET | Freq: Four times a day (QID) | ORAL | 0 refills | Status: DC | PRN

## 2023-02-07 MED ORDER — INDOCYANINE GREEN 25 MG IV SOLR
INTRAVENOUS | Status: AC
  Filled 2023-02-07: qty 10

## 2023-02-07 MED ORDER — CEFAZOLIN SODIUM-DEXTROSE 2-4 GM/100ML-% IV SOLN
INTRAVENOUS | Status: AC
  Filled 2023-02-07: qty 100

## 2023-02-07 MED ORDER — OXYCODONE HCL 5 MG PO TABS
ORAL_TABLET | ORAL | Status: AC
  Filled 2023-02-07: qty 1

## 2023-02-07 MED ORDER — SCOPOLAMINE 1 MG/3DAYS TD PT72
MEDICATED_PATCH | TRANSDERMAL | Status: DC | PRN
  Administered 2023-02-07: 1 via TRANSDERMAL

## 2023-02-07 MED ORDER — FENTANYL CITRATE (PF) 100 MCG/2ML IJ SOLN
25.0000 ug | INTRAMUSCULAR | Status: DC | PRN
  Administered 2023-02-07: 25 ug via INTRAVENOUS

## 2023-02-07 MED ORDER — PROPOFOL 10 MG/ML IV BOLUS
INTRAVENOUS | Status: DC | PRN
Start: 2023-02-07 — End: 2023-02-07
  Administered 2023-02-07: 120 mg via INTRAVENOUS

## 2023-02-07 MED ORDER — GLYCOPYRROLATE 0.2 MG/ML IJ SOLN
INTRAMUSCULAR | Status: DC | PRN
  Administered 2023-02-07: .2 mg via INTRAVENOUS

## 2023-02-07 MED ORDER — BUPIVACAINE LIPOSOME 1.3 % IJ SUSP
INTRAMUSCULAR | Status: AC
  Filled 2023-02-07: qty 20

## 2023-02-07 MED ORDER — BUPIVACAINE-EPINEPHRINE (PF) 0.25% -1:200000 IJ SOLN
INTRAMUSCULAR | Status: AC
  Filled 2023-02-07: qty 30

## 2023-02-07 MED ORDER — DROPERIDOL 2.5 MG/ML IJ SOLN
0.6250 mg | Freq: Once | INTRAMUSCULAR | Status: AC | PRN
  Administered 2023-02-07: 0.625 mg via INTRAVENOUS

## 2023-02-07 MED ORDER — OXYCODONE HCL 5 MG PO TABS
5.0000 mg | ORAL_TABLET | Freq: Once | ORAL | Status: AC | PRN
  Administered 2023-02-07: 5 mg via ORAL

## 2023-02-07 MED ORDER — 0.9 % SODIUM CHLORIDE (POUR BTL) OPTIME
TOPICAL | Status: DC | PRN
  Administered 2023-02-07: 500 mL

## 2023-02-07 MED ORDER — EPHEDRINE SULFATE (PRESSORS) 50 MG/ML IJ SOLN
INTRAMUSCULAR | Status: DC | PRN
  Administered 2023-02-07: 5 mg via INTRAVENOUS

## 2023-02-07 MED ORDER — PROPOFOL 10 MG/ML IV BOLUS
INTRAVENOUS | Status: AC
  Filled 2023-02-07: qty 20

## 2023-02-07 MED ORDER — KETOROLAC TROMETHAMINE 30 MG/ML IJ SOLN
INTRAMUSCULAR | Status: DC | PRN
  Administered 2023-02-07: 30 mg via INTRAVENOUS

## 2023-02-07 MED ORDER — LIDOCAINE HCL (CARDIAC) PF 100 MG/5ML IV SOSY
PREFILLED_SYRINGE | INTRAVENOUS | Status: DC | PRN
  Administered 2023-02-07: 50 mg via INTRAVENOUS

## 2023-02-07 MED ORDER — MIDAZOLAM HCL 2 MG/2ML IJ SOLN
INTRAMUSCULAR | Status: AC
  Filled 2023-02-07: qty 2

## 2023-02-07 MED ORDER — FENTANYL CITRATE (PF) 100 MCG/2ML IJ SOLN
INTRAMUSCULAR | Status: AC
  Filled 2023-02-07: qty 2

## 2023-02-07 MED ORDER — SUGAMMADEX SODIUM 200 MG/2ML IV SOLN
INTRAVENOUS | Status: DC | PRN
  Administered 2023-02-07: 200 mg via INTRAVENOUS

## 2023-02-07 MED ORDER — DROPERIDOL 2.5 MG/ML IJ SOLN
INTRAMUSCULAR | Status: AC
  Filled 2023-02-07: qty 2

## 2023-02-07 MED ORDER — ONDANSETRON HCL 4 MG PO TABS: 4.0000 mg | ORAL_TABLET | Freq: Three times a day (TID) | ORAL | 0 refills | Status: DC | PRN

## 2023-02-07 MED ORDER — ROCURONIUM BROMIDE 100 MG/10ML IV SOLN
INTRAVENOUS | Status: DC | PRN
  Administered 2023-02-07: 40 mg via INTRAVENOUS
  Administered 2023-02-07: 20 mg via INTRAVENOUS

## 2023-02-07 MED ORDER — BUPIVACAINE-EPINEPHRINE (PF) 0.25% -1:200000 IJ SOLN
INTRAMUSCULAR | Status: DC | PRN
  Administered 2023-02-07: 50 mL via INTRAMUSCULAR

## 2023-02-07 MED ORDER — SCOPOLAMINE 1 MG/3DAYS TD PT72
MEDICATED_PATCH | TRANSDERMAL | Status: AC
  Filled 2023-02-07: qty 1

## 2023-02-07 MED ORDER — ONDANSETRON HCL 4 MG/2ML IJ SOLN
INTRAMUSCULAR | Status: DC | PRN
  Administered 2023-02-07: 4 mg via INTRAVENOUS

## 2023-02-07 MED ORDER — KETAMINE HCL 50 MG/5ML IJ SOSY
PREFILLED_SYRINGE | INTRAMUSCULAR | Status: AC
  Filled 2023-02-07: qty 5

## 2023-02-07 MED ORDER — CHLORHEXIDINE GLUCONATE 0.12 % MT SOLN
OROMUCOSAL | Status: AC
  Filled 2023-02-07: qty 15

## 2023-02-07 SURGICAL SUPPLY — 46 items
ADH SKN CLS APL DERMABOND .7 (GAUZE/BANDAGES/DRESSINGS) ×2
CANNULA REDUCER 12-8 DVNC XI (CANNULA) ×2 IMPLANT
CATH REDDICK CHOLANGI 4FR 50CM (CATHETERS) IMPLANT
CAUTERY HOOK MNPLR 1.6 DVNC XI (INSTRUMENTS) ×2 IMPLANT
CLIP LIGATING HEMO O LOK GREEN (MISCELLANEOUS) ×2 IMPLANT
DERMABOND ADVANCED .7 DNX12 (GAUZE/BANDAGES/DRESSINGS) ×2 IMPLANT
DRAPE ARM DVNC X/XI (DISPOSABLE) ×8 IMPLANT
DRAPE COLUMN DVNC XI (DISPOSABLE) ×2 IMPLANT
ELECT CAUTERY BLADE 6.4 (BLADE) ×2 IMPLANT
ELECT REM PT RETURN 9FT ADLT (ELECTROSURGICAL) ×2
ELECTRODE REM PT RTRN 9FT ADLT (ELECTROSURGICAL) ×2 IMPLANT
FORCEPS BPLR R/ABLATION 8 DVNC (INSTRUMENTS) ×2 IMPLANT
FORCEPS PROGRASP DVNC XI (FORCEP) ×2 IMPLANT
GLOVE BIO SURGEON STRL SZ7 (GLOVE) ×4 IMPLANT
GOWN STRL REUS W/ TWL LRG LVL3 (GOWN DISPOSABLE) ×8 IMPLANT
GOWN STRL REUS W/TWL LRG LVL3 (GOWN DISPOSABLE) ×8
IRRIGATION STRYKERFLOW (MISCELLANEOUS) IMPLANT
IRRIGATOR STRYKERFLOW (MISCELLANEOUS)
IV CATH ANGIO 12GX3 LT BLUE (NEEDLE) IMPLANT
KIT PINK PAD W/HEAD ARE REST (MISCELLANEOUS) ×2
KIT PINK PAD W/HEAD ARM REST (MISCELLANEOUS) ×2 IMPLANT
LABEL OR SOLS (LABEL) ×2 IMPLANT
MANIFOLD NEPTUNE II (INSTRUMENTS) ×2 IMPLANT
NDL HYPO 22X1.5 SAFETY MO (MISCELLANEOUS) ×2 IMPLANT
NEEDLE HYPO 22X1.5 SAFETY MO (MISCELLANEOUS) ×2 IMPLANT
NS IRRIG 500ML POUR BTL (IV SOLUTION) ×2 IMPLANT
OBTURATOR OPTICAL STND 8 DVNC (TROCAR) ×2
OBTURATOR OPTICALSTD 8 DVNC (TROCAR) ×2 IMPLANT
PACK LAP CHOLECYSTECTOMY (MISCELLANEOUS) ×2 IMPLANT
PENCIL SMOKE EVACUATOR (MISCELLANEOUS) ×2 IMPLANT
SEAL UNIV 5-12 XI (MISCELLANEOUS) ×8 IMPLANT
SET TUBE SMOKE EVAC HIGH FLOW (TUBING) ×2 IMPLANT
SOL ELECTROSURG ANTI STICK (MISCELLANEOUS) ×2
SOLUTION ELECTROSURG ANTI STCK (MISCELLANEOUS) ×2 IMPLANT
SPIKE FLUID TRANSFER (MISCELLANEOUS) ×2 IMPLANT
SPONGE T-LAP 18X18 ~~LOC~~+RFID (SPONGE) ×2 IMPLANT
SPONGE T-LAP 4X18 ~~LOC~~+RFID (SPONGE) IMPLANT
STOPCOCK 3WAY MALE LL (IV SETS) IMPLANT
SUT MNCRL AB 4-0 PS2 18 (SUTURE) ×2 IMPLANT
SUT VICRYL 0 UR6 27IN ABS (SUTURE) ×4 IMPLANT
SYR 20ML LL LF (SYRINGE) IMPLANT
SYS BAG RETRIEVAL 10MM (BASKET) ×2
SYSTEM BAG RETRIEVAL 10MM (BASKET) ×2 IMPLANT
TRAP FLUID SMOKE EVACUATOR (MISCELLANEOUS) ×2 IMPLANT
WATER STERILE IRR 3000ML UROMA (IV SOLUTION) IMPLANT
WATER STERILE IRR 500ML POUR (IV SOLUTION) ×2 IMPLANT

## 2023-02-07 NOTE — Anesthesia Procedure Notes (Signed)
Procedure Name: Intubation Date/Time: 02/07/2023 7:42 AM  Performed by: Cheral Bay, CRNAPre-anesthesia Checklist: Patient identified, Emergency Drugs available, Suction available and Patient being monitored Patient Re-evaluated:Patient Re-evaluated prior to induction Oxygen Delivery Method: Circle system utilized Preoxygenation: Pre-oxygenation with 100% oxygen Induction Type: IV induction Ventilation: Mask ventilation without difficulty Laryngoscope Size: McGraph and 3 Grade View: Grade I Tube type: Oral Tube size: 7.0 mm Number of attempts: 1 Airway Equipment and Method: Stylet Placement Confirmation: ETT inserted through vocal cords under direct vision, positive ETCO2 and breath sounds checked- equal and bilateral Secured at: 21 cm Tube secured with: Tape Dental Injury: Teeth and Oropharynx as per pre-operative assessment  Comments: Minimal mouth opening needed for intubation

## 2023-02-07 NOTE — Op Note (Signed)
Robotic assisted laparoscopic Cholecystectomy  Pre-operative Diagnosis: biliary colic  Post-operative Diagnosis: same  Procedure:  Robotic assisted laparoscopic Cholecystectomy  Surgeon: Sterling Big, MD FACS  Anesthesia: Gen. with endotracheal tube  Findings: Chronic mild Cholecystitis Distended Gallbladder   Estimated Blood Loss: 5cc       Specimens: Gallbladder           Complications: none   Procedure Details  The patient was seen again in the Holding Room. The benefits, complications, treatment options, and expected outcomes were discussed with the patient. The risks of bleeding, infection, recurrence of symptoms, failure to resolve symptoms, bile duct damage, bile duct leak, retained common bile duct stone, bowel injury, any of which could require further surgery and/or ERCP, stent, or papillotomy were reviewed with the patient. The likelihood of improving the patient's symptoms with return to their baseline status is good.  The patient and/or family concurred with the proposed plan, giving informed consent.  The patient was taken to Operating Room, identified  and the procedure verified as Laparoscopic Cholecystectomy.  A Time Out was held and the above information confirmed.  Prior to the induction of general anesthesia, antibiotic prophylaxis was administered. VTE prophylaxis was in place. General endotracheal anesthesia was then administered and tolerated well. After the induction, the abdomen was prepped with Chloraprep and draped in the sterile fashion. The patient was positioned in the supine position.  Cut down technique was used to enter the abdominal cavity and a Hasson trochar was placed after two vicryl stitches were anchored to the fascia. Pneumoperitoneum was then created with CO2 and tolerated well without any adverse changes in the patient's vital signs.  Three 8-mm ports were placed under direct vision. All skin incisions  were infiltrated with a local anesthetic  agent before making the incision and placing the trocars.   The patient was positioned  in reverse Trendelenburg, robot was brought to the surgical field and docked in the standard fashion.  We made sure all the instrumentation was kept indirect view at all times and that there were no collision between the arms. I scrubbed out and went to the console.  The gallbladder was identified, the fundus grasped and retracted cephalad. Adhesions were lysed bluntly. The infundibulum was grasped and retracted laterally, exposing the peritoneum overlying the triangle of Calot. This was then divided and exposed in a blunt fashion. An extended critical view of the cystic duct and cystic artery was obtained.  The cystic duct was clearly identified and bluntly dissected.   Artery and duct were double clipped and divided. Using ICG cholangiography we visualize the cystic duct and CBD w/o evidence of bile injuries. The gallbladder was taken from the gallbladder fossa in a retrograde fashion with the electrocautery.  Hemostasis was achieved with the electrocautery. nspection of the right upper quadrant was performed. No bleeding, bile duct injury or leak, or bowel injury was noted. Robotic instruments and robotic arms were undocked in the standard fashion.  I scrubbed back in.  The gallbladder was removed and placed in an Endocatch bag.   Pneumoperitoneum was released.  The periumbilical port site was closed with interrumpted 0 Vicryl sutures. 4-0 subcuticular Monocryl was used to close the skin. Dermabond was  applied.  The patient was then extubated and brought to the recovery room in stable condition. Sponge, lap, and needle counts were correct at closure and at the conclusion of the case.               Tremain Rucinski [place],  MD, FACS

## 2023-02-07 NOTE — Interval H&P Note (Signed)
History and Physical Interval Note:  02/07/2023 7:20 AM  Cynthia Houston  has presented today for surgery, with the diagnosis of biliary dyskinesia.  The various methods of treatment have been discussed with the patient and family. After consideration of risks, benefits and other options for treatment, the patient has consented to  Procedure(s): XI ROBOTIC ASSISTED LAPAROSCOPIC CHOLECYSTECTOMY (N/A) INDOCYANINE GREEN FLUORESCENCE IMAGING (ICG) (N/A) as a surgical intervention.  The patient's history has been reviewed, patient examined, no change in status, stable for surgery.  I have reviewed the patient's chart and labs.  Questions were answered to the patient's satisfaction.     Cynthia Houston F Cynthia Houston

## 2023-02-07 NOTE — Anesthesia Preprocedure Evaluation (Addendum)
Anesthesia Evaluation  Patient identified by MRN, date of birth, ID band Patient awake    Reviewed: Allergy & Precautions, NPO status , Patient's Chart, lab work & pertinent test results  History of Anesthesia Complications (+) PONV and history of anesthetic complications  Airway Mallampati: III  TM Distance: >3 FB Neck ROM: full    Dental  (+) Chipped   Pulmonary neg pulmonary ROS, neg shortness of breath   Pulmonary exam normal        Cardiovascular Exercise Tolerance: Good (-) angina (-) Past MI negative cardio ROS Normal cardiovascular exam     Neuro/Psych  Headaches PSYCHIATRIC DISORDERS         GI/Hepatic Neg liver ROS,GERD  Controlled,,  Endo/Other  negative endocrine ROS    Renal/GU      Musculoskeletal   Abdominal   Peds  Hematology negative hematology ROS (+)   Anesthesia Other Findings Past Medical History: 12/2022: Biliary dyskinesia No date: Chicken pox No date: Depression 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 No date: Family history of adverse reaction to anesthesia     Comment:  Mother - PONV No date: GERD (gastroesophageal reflux disease) No date: Headache(784.0) 03/07/2007: Hx of dysplastic nevus     Comment:  right lateral thigh, slight to mod No date: Osteoarthritis     Comment:  right hand No date: Paroxysmal tachycardia (HCC) No date: PONV (postoperative nausea and vomiting) No date: Tendinitis     Comment:  right elbow No date: Urinary tract infection  Past Surgical History: 2008: ABDOMINAL HYSTERECTOMY     Comment:  supracervical  07/16/2019: Quintella Reichert OSTEOTOMY; Left     Comment:  Procedure: PHALANX OSTEOTOMY AKIN;  Surgeon: Recardo Evangelist, DPM;  Location: [facility];  Service:               Podiatry;  Laterality: Left; No date: BREAST CYST ASPIRATION; Right     Comment:  neg 2011: BUNIONECTOMY; Right     Comment:  SCREWS PLACED 07/16/2019: BUNIONECTOMY;  Left     Comment:  Procedure: LAPIDUS-TYPE;  Surgeon: Recardo Evangelist,               DPM;  Location: [facility];  Service: Podiatry;               Laterality: Left;  LMA WITH BLOCK 2008: COLONOSCOPY     Comment:  Dr. Mechele Collin 05/24/2020: COLONOSCOPY WITH PROPOFOL; N/A     Comment:  Procedure: COLONOSCOPY WITH PROPOFOL;  Surgeon: Midge Minium, MD;  Location: [facility];  Service:               Endoscopy;  Laterality: N/A; 1991: DIAGNOSTIC LAPAROSCOPY 1989: ECTOPIC PREGNANCY SURGERY; Right     Comment:  removed right tube 1993: ECTOPIC PREGNANCY SURGERY; Left 1992: NEOSALPINGOSTOMY; Left 2002: UTERINE FIBROID SURGERY 2004: UTERINE FIBROID SURGERY 07/16/2019: WEIL OSTEOTOMY; Left     Comment:  Procedure: WEIL OSTEOTOMY;  Surgeon: Recardo Evangelist,               DPM;  Location: [facility];  Service: Podiatry;               Laterality: Left;     Reproductive/Obstetrics negative OB ROS  Anesthesia Physical Anesthesia Plan  ASA: 2  Anesthesia Plan: General ETT   Post-op Pain Management:    Induction: Intravenous  PONV Risk Score and Plan: Ondansetron, Dexamethasone, Midazolam and Treatment may vary due to age or medical condition  Airway Management Planned: Oral ETT  Additional Equipment:   Intra-op Plan:   Post-operative Plan: Extubation in OR  Informed Consent: I have reviewed the patients History and Physical, chart, labs and discussed the procedure including the risks, benefits and alternatives for the proposed anesthesia with the patient or authorized representative who has indicated his/her understanding and acceptance.     Dental Advisory Given  Plan Discussed with: Anesthesiologist, CRNA and Surgeon  Anesthesia Plan Comments: (Patient reports history of her "jaw locking up". Normal mouth opening on exam.  Consented that we plan to be careful but that this may happen with  intubation and voiced assent.  Patient consented for risks of anesthesia including but not limited to:  - adverse reactions to medications - damage to eyes, teeth, lips or other oral mucosa - nerve damage due to positioning  - sore throat or hoarseness - Damage to heart, brain, nerves, lungs, other parts of body or loss of life  Patient voiced understanding.)       Anesthesia Quick Evaluation

## 2023-02-07 NOTE — Discharge Instructions (Addendum)
  AMBULATORY SURGERY  DISCHARGE INSTRUCTIONS   The drugs that you were given will stay in your system until tomorrow so for the next 24 hours you should not:  Drive an automobile Make any legal decisions Drink any alcoholic beverage   You may resume regular meals tomorrow.  Today it is better to start with liquids and gradually work up to solid foods.  You may eat anything you prefer, but it is better to start with liquids, then soup and crackers, and gradually work up to solid foods.   Please notify your doctor immediately if you have any unusual bleeding, trouble breathing, redness and pain at the surgery site, drainage, fever, or pain not relieved by medication.    Additional Instructions: PLEASE LEAVE EXPAREL (TEAL) BRACELET ON FOR 4 DAYS     Please contact your physician with any problems or Same Day Surgery at [phone], Monday through Friday 6 am to 4 pm, or [place] at [facility] number at [phone].

## 2023-02-07 NOTE — Transfer of Care (Signed)
Immediate Anesthesia Transfer of Care Note  Patient: Cynthia Houston  Procedure(s) Performed: XI ROBOTIC ASSISTED LAPAROSCOPIC CHOLECYSTECTOMY (Abdomen) INDOCYANINE GREEN FLUORESCENCE IMAGING (ICG)  Patient Location: PACU  Anesthesia Type:General  Level of Consciousness: awake  Airway & Oxygen Therapy: Patient Spontanous Breathing  Post-op Assessment: Report given to RN and Post -op Vital signs reviewed and stable  Post vital signs: Reviewed and stable  Last Vitals:  Vitals Value Taken Time  BP 116/51 02/07/23 0838  Temp 36.2 C 02/07/23 0838  Pulse 88 02/07/23 0840  Resp 21 02/07/23 0840  SpO2 98 % 02/07/23 0840  Vitals shown include unfiled device data.  Last Pain:  Vitals:   02/07/23 0838  TempSrc:   PainSc: 0-No pain         Complications: No notable events documented.

## 2023-02-08 NOTE — Anesthesia Postprocedure Evaluation (Signed)
Anesthesia Post Note  Patient: JESALYN ANTCZAK  Procedure(s) Performed: XI ROBOTIC ASSISTED LAPAROSCOPIC CHOLECYSTECTOMY (Abdomen) INDOCYANINE GREEN FLUORESCENCE IMAGING (ICG)  Patient location during evaluation: PACU Anesthesia Type: General Level of consciousness: awake and alert Pain management: pain level controlled Vital Signs Assessment: post-procedure vital signs reviewed and stable Respiratory status: spontaneous breathing, nonlabored ventilation and respiratory function stable Cardiovascular status: blood pressure returned to baseline and stable Postop Assessment: no apparent nausea or vomiting Anesthetic complications: no   No notable events documented.   Last Vitals:  Vitals:   02/07/23 0915 02/07/23 0933  BP: 118/64 (!) 109/53  Pulse: 73 77  Resp: 19 18  Temp:  (!) 36.3 C  SpO2: 97% 98%    Last Pain:  Vitals:   02/08/23 1342  TempSrc:   PainSc: 3                  Foye Deer

## 2023-02-21 ENCOUNTER — Ambulatory Visit: Payer: Managed Care, Other (non HMO) | Admitting: Physician Assistant

## 2023-02-21 ENCOUNTER — Encounter: Payer: Self-pay | Admitting: Physician Assistant

## 2023-02-21 VITALS — BP 118/62 | HR 63 | Temp 97.8°F | Ht 63.0 in | Wt 117.2 lb

## 2023-02-21 DIAGNOSIS — Z09 Encounter for follow-up examination after completed treatment for conditions other than malignant neoplasm: Secondary | ICD-10-CM

## 2023-02-21 DIAGNOSIS — K8044 Calculus of bile duct with chronic cholecystitis without obstruction: Secondary | ICD-10-CM

## 2023-02-21 DIAGNOSIS — K828 Other specified diseases of gallbladder: Secondary | ICD-10-CM

## 2023-02-21 NOTE — Progress Notes (Signed)
[facility] POST-OP OFFICE VISIT  02/21/2023  HPI: Cynthia Houston is a 57 y.o. female 14 days s/p robotic assisted laparoscopic cholecystectomy for biliary colic with Dr Everlene Farrier  She is doing well Soreness for about 2-3 days; now improving No fever, chills, nausea, emesis She is constipated; has colace at home Ambulating well   Of note, she reports developing a rash over the weekend around the incisions. No new exposures, lotions, detergents. This is bumpy and itchy.   Vital signs: BP 118/62   Pulse 63   Temp 97.8 F (36.6 C) (Oral)   Ht 5\' 3"  (1.6 m)   Wt 117 lb 3.2 oz (53.2 kg)   SpO2 100%   BMI 20.76 kg/m    Physical Exam: Constitutional: Well appearing female, NAD Abdomen: Soft, non-tender, non-distended, no rebound/guarding Skin: Laparoscopic incisions are healing well, there is a urticarial rash around all incisions consistent with allergic reaction, no evidence of infection.   Assessment/Plan: This is a 57 y.o. female 14 days s/p robotic assisted laparoscopic cholecystectomy for biliary colic with Dr Everlene Farrier   - Does appear to be having a localized allergic reaction around incisions sites, suspect this is from dermabond although delayed onset is strange. No other new exposures. Recommend topical benadryl, PO benadryl, and PO Pepcid until rash resolves. Encouraged scrubbing any remaining dermabond residue off in shower.    - Pain control prn  - Reviewed lifting restrictions; 4 weeks total  - Reviewed surgical pathology; Chronic Cholecystitis  - She can follow up on as needed basis; She understands to call with questions/concerns or if rash fails to improve  -- Lynden Oxford, PA-C [facility] 02/21/2023, 2:30 PM M-F: 7am - 4pm

## 2023-02-21 NOTE — Patient Instructions (Signed)

## 2023-02-27 ENCOUNTER — Telehealth: Payer: Self-pay | Admitting: Surgery

## 2023-02-27 NOTE — Telephone Encounter (Signed)
Patient had robotic chole done on 8/15 with Dr. Everlene Farrier. She came in on 8/29 for post op. Then had a red, itchy bumpy rash.  She states the rash is still the same on right side of incision.  Has been using Benadryl and Claritin, but nothing seems to help.  Not sure if she was allergic to something during the OR procedure.  Patient states she is out of town and will not be back until Sunday.  Concerned rash not going away.  Please advise and call her. Thank you.

## 2023-03-04 ENCOUNTER — Encounter: Payer: Self-pay | Admitting: Surgery

## 2023-03-04 ENCOUNTER — Ambulatory Visit (INDEPENDENT_AMBULATORY_CARE_PROVIDER_SITE_OTHER): Payer: Managed Care, Other (non HMO) | Admitting: Surgery

## 2023-03-04 VITALS — BP 131/72 | HR 65 | Temp 98.3°F | Ht 63.0 in | Wt 119.6 lb

## 2023-03-04 DIAGNOSIS — Z09 Encounter for follow-up examination after completed treatment for conditions other than malignant neoplasm: Secondary | ICD-10-CM

## 2023-03-04 DIAGNOSIS — K8044 Calculus of bile duct with chronic cholecystitis without obstruction: Secondary | ICD-10-CM

## 2023-03-04 MED ORDER — TRIAMCINOLONE 0.1 % CREAM:EUCERIN CREAM 1:1: 1.0000 | TOPICAL_CREAM | Freq: Two times a day (BID) | CUTANEOUS | 0 refills | Status: DC | PRN

## 2023-03-04 NOTE — Patient Instructions (Signed)
Please pick up your prescription at your pharmacy.  If you have any concerns or questions, please feel free to call our office.   Rash, Adult  A rash is a breakout of spots or blotches on the skin. It can change the way your skin looks and feels. Many things can cause a rash. The goal of treatment is to stop the itching and keep the rash from spreading. Follow these instructions at home: Medicine Take or apply over-the-counter and prescription medicines only as told by your doctor. These may include medicines to treat: Red or swollen skin. Itching. An allergy. Pain. An infection.  Skin care Put a cool, wet cloth on the rash. Do not scratch or rub your skin. Try not to cover the rash. Keep it exposed to air as often as you can. Managing itching and discomfort Avoid hot showers or baths. These can make itching worse. A cold shower may help. Try taking a bath with: Epsom salts. You can get these at your pharmacy or grocery store. Follow the instructions on the package. Baking soda. Pour a small amount into the bath as told by your doctor. Colloidal oatmeal. You can get this at your pharmacy or grocery store. Follow the instructions on the package. Try putting baking soda paste on your skin. Stir water into baking soda until it gets like a paste. Try putting on a lotion to help with itching (calamine lotion). Keep cool. Stay out of the sun. Sweating and being hot can make itching worse. General instructions  Rest as needed. Drink enough fluid to keep your pee (urine) pale yellow. Wear loose-fitting clothes. Avoid scented soaps, detergents, and perfumes. Use gentle soaps, detergents, perfumes, and cosmetics. Avoid the things that cause your rash. Keep a journal to help keep track of what causes your rash. Write down: What you eat. What cosmetics you use. What you drink. What you wear. This includes jewelry. Contact a doctor if: You sweat a lot at night. You pee (urinate) more or  less than normal. Your pee is a darker color than normal. Your eyes are sensitive to light. Your skin or the white parts of your eyes turn yellow. Your skin tingles or is numb. You get painful blisters in your nose or mouth. Your rash does not go away after a few days, or it gets worse. You are more tired than normal. You are more thirsty than normal. You have new or worse symptoms. These may include: Pain in your belly. A fever. Watery poop (diarrhea). Vomiting. Weakness. Weight loss. Get help right away if: You start to feel mixed up (confused). You have a very bad headache or a stiff neck. You have very bad joint pain or stiffness. You get very sleepy or not responsive. You have a seizure. This information is not intended to replace advice given to you by your health care provider. Make sure you discuss any questions you have with your health care provider. Document Revised: 03/30/2022 Document Reviewed: 03/30/2022 Elsevier Patient Education  2024 [employer].

## 2023-03-06 NOTE — Progress Notes (Signed)
Cynthia Houston is 57 year old female 2 and half weeks out from robotic cholecystectomy.  He was uneventful.  She did develop rash and itching a week after the surgery.  She has tried some Benadryl cream.  She continues to itch although he has improved.  No fevers no chills she is tolerating p.o.  PE NAD Abd: soft, incisional healing well, no infection or peritonitis. There is rash specially in the right lower quadrant.  This seems to be allergic in nature.  A/P contact dermatitis.  The question is whether this was related to a potential side effect or dermatitis related to either the prep or the sutures.  Very difficult to say.  I have discussed with her and I have prescribed steroid cream.  Also discussed with her about changing detergents that we will potentially worsen dermatitis.  Discussed with her about benign nature of her issue

## 2023-05-02 ENCOUNTER — Ambulatory Visit
Admission: RE | Admit: 2023-05-02 | Discharge: 2023-05-02 | Disposition: A | Discharge status: Home / Self Care | Payer: Managed Care, Other (non HMO) | Source: Ambulatory Visit | Attending: Internal Medicine | Admitting: Internal Medicine

## 2023-05-02 DIAGNOSIS — Z1231 Encounter for screening mammogram for malignant neoplasm of breast: Secondary | ICD-10-CM | POA: Diagnosis present

## 2023-08-13 ENCOUNTER — Ambulatory Visit: Payer: Managed Care, Other (non HMO) | Admitting: Family Medicine

## 2023-08-13 ENCOUNTER — Encounter: Payer: Self-pay | Admitting: Family Medicine

## 2023-08-13 VITALS — BP 118/78 | HR 69 | Temp 98.6°F | Wt 122.0 lb

## 2023-08-13 DIAGNOSIS — R21 Rash and other nonspecific skin eruption: Secondary | ICD-10-CM | POA: Diagnosis not present

## 2023-08-13 MED ORDER — TRIAMCINOLONE ACETONIDE 0.5 % EX OINT: 1.0000 | TOPICAL_OINTMENT | Freq: Two times a day (BID) | CUTANEOUS | 0 refills | Status: DC

## 2023-08-13 NOTE — Assessment & Plan Note (Signed)
 Undetermined cause.  Possibly an atopic reaction.  We will trial triamcinolone 0.5% ointment twice daily.  Discussed this should provide some benefit in the next few days.  If not improving to some degree by early next week she will let us know.  If she has spreading rash she will let us know.

## 2023-08-13 NOTE — Progress Notes (Signed)
  Marikay Alar, MD Phone: [phone]  Cynthia Houston is a 58 y.o. female who presents today for same-day visit.  Rash: Onset of symptoms over the last week.  Started on her right anterior mid arm.  Eventually went to her left anterior mid arm.  Had 1 area on her throat that cleared up.  Notes no changes in soaps or detergents.  No new foods.  No new medications.  No new supplements.  No travel.  No contacts with similar rash.  No fevers.  She does report being under some stress and wonders if that could be contributing.  Notes the rash itches.  Social History   Tobacco Use  Smoking Status Never   Passive exposure: Never  Smokeless Tobacco Never    Current Outpatient Medications on File Prior to Visit  Medication Sig Dispense Refill   sertraline (ZOLOFT) 50 MG tablet TAKE 1 TABLET BY MOUTH DAILY (Patient taking differently: Take 25 mg by mouth at bedtime.) 90 tablet 1   No current facility-administered medications on file prior to visit.     ROS see history of present illness  Objective  Physical Exam Vitals:   08/13/23 0851  BP: 118/78  Pulse: 69  Temp: 98.6 F (37 C)  SpO2: 97%    BP Readings from Last 3 Encounters:  08/13/23 118/78  03/04/23 131/72  02/21/23 118/62   Wt Readings from Last 3 Encounters:  08/13/23 122 lb (55.3 kg)  03/04/23 119 lb 9.6 oz (54.3 kg)  02/21/23 117 lb 3.2 oz (53.2 kg)    Physical Exam Constitutional:      General: She is not in acute distress. Neurological:     Mental Status: She is alert.    Right arm, rash is rough and slightly raised on both arms   Left arm   Assessment/Plan: Please see individual problem list.  Rash Assessment & Plan: Undetermined cause.  Possibly an atopic reaction.  We will trial triamcinolone 0.5% ointment twice daily.  Discussed this should provide some benefit in the next few days.  If not improving to some degree by early next week she will let us know.  If she has spreading rash she will  let us know.  Orders: -     Triamcinolone Acetonide; Apply 1 Application topically 2 (two) times daily.  Dispense: 30 g; Refill: 0    Return if symptoms worsen or fail to improve.   Marikay Alar, MD [facility]

## 2023-09-24 ENCOUNTER — Telehealth: Payer: Self-pay | Admitting: Internal Medicine

## 2023-09-24 ENCOUNTER — Encounter: Payer: Self-pay | Admitting: Internal Medicine

## 2023-09-24 NOTE — Telephone Encounter (Signed)
 Left message to call and reschedule 01/31/2024 appointment to another day.  E2C2 please schedule

## 2023-11-01 ENCOUNTER — Ambulatory Visit: Admitting: Family

## 2023-11-01 ENCOUNTER — Encounter: Payer: Self-pay | Admitting: Family

## 2023-11-01 VITALS — BP 116/76 | HR 67 | Temp 97.2°F | Ht 63.0 in | Wt 122.1 lb

## 2023-11-01 DIAGNOSIS — H6120 Impacted cerumen, unspecified ear: Secondary | ICD-10-CM | POA: Insufficient documentation

## 2023-11-01 DIAGNOSIS — H6121 Impacted cerumen, right ear: Secondary | ICD-10-CM | POA: Diagnosis not present

## 2023-11-01 NOTE — Progress Notes (Signed)
 Assessment & Plan:  Impacted cerumen of right ear Assessment & Plan: Unable to complete ear irrigation due to brief vertigo during procedure, quickly resolved. Advised to start over-the-counter antihistamine, Flonase  for potentially associated eustachian tube dysfunction, sinusitis r/t viral v allergies Advised to complete Debrox OTC.  Urgent referral placed to ENT as patient is very uncomfortable.  She will let me know how she is doing.  Orders: -     Ambulatory referral to ENT     Return precautions given.   Risks, benefits, and alternatives of the medications and treatment plan prescribed today were discussed, and patient expressed understanding.   Education regarding symptom management and diagnosis given to patient on AVS either electronically or printed.  No follow-ups on file.  Bascom Bossier, FNP  Subjective:    Patient ID: Cynthia Houston, female    DOB: Jun 05, 1966, 58 y.o.   MRN: [id number]  CC: Cynthia Houston is a 58 y.o. female who presents today for an acute visit.    HPI: Complains of right ear pain x 3 days She feels is 'talking loud' She has tried debrox .   She felt that it may ' popped' in the middle of the night Right sided facial pressure started today  No dental pain, sore throat, fever, cough.      Allergies: Sulfa antibiotics, Fiorinal [butalbital-aspirin-caffeine], Percocet [oxycodone -acetaminophen ], Augmentin [amoxicillin-pot clavulanate], and Doxycycline  Current Outpatient Medications on File Prior to Visit  Medication Sig Dispense Refill   sertraline  (ZOLOFT ) 50 MG tablet TAKE 1 TABLET BY MOUTH DAILY (Patient taking differently: Take 25 mg by mouth at bedtime.) 90 tablet 1   triamcinolone  ointment (KENALOG ) 0.5 % Apply 1 Application topically 2 (two) times daily. 30 g 0   No current facility-administered medications on file prior to visit.    Review of Systems  Constitutional:  Negative for chills and fever.  HENT:  Positive for sinus  pressure (right sided). Negative for congestion and sore throat.   Respiratory:  Negative for cough and shortness of breath.   Cardiovascular:  Negative for chest pain and palpitations.  Gastrointestinal:  Negative for nausea and vomiting.      Objective:    BP 116/76   Pulse 67   Temp (!) 97.2 F (36.2 C) (Oral)   Ht 5\' 3"  (1.6 m)   Wt 122 lb 1.6 oz (55.4 kg)   SpO2 99%   BMI 21.63 kg/m   BP Readings from Last 3 Encounters:  11/01/23 116/76  08/13/23 118/78  03/04/23 131/72   Wt Readings from Last 3 Encounters:  11/01/23 122 lb 1.6 oz (55.4 kg)  08/13/23 122 lb (55.3 kg)  03/04/23 119 lb 9.6 oz (54.3 kg)    Physical Exam Vitals reviewed.  Constitutional:      Appearance: She is well-developed.  HENT:     Head: Normocephalic and atraumatic.     Right Ear: Hearing, tympanic membrane, ear canal and external ear normal. No decreased hearing noted. No drainage, swelling or tenderness. No foreign body.     Left Ear: Hearing, tympanic membrane, ear canal and external ear normal. No decreased hearing noted. No drainage, swelling or tenderness.  No middle ear effusion. No foreign body. Tympanic membrane is not erythematous or bulging.     Ears:     Comments: Right complete cerumen impaction.  Left moderate cerumen.    Nose: Nose normal. No rhinorrhea.     Right Sinus: No maxillary sinus tenderness or frontal sinus tenderness.  Left Sinus: No maxillary sinus tenderness or frontal sinus tenderness.     Mouth/Throat:     Pharynx: Uvula midline. No oropharyngeal exudate or posterior oropharyngeal erythema.     Tonsils: No tonsillar abscesses.  Eyes:     Conjunctiva/sclera: Conjunctivae normal.  Cardiovascular:     Rate and Rhythm: Regular rhythm.     Pulses: Normal pulses.     Heart sounds: Normal heart sounds.  Pulmonary:     Effort: Pulmonary effort is normal.     Breath sounds: Normal breath sounds. No wheezing, rhonchi or rales.  Lymphadenopathy:     Head:     Right  side of head: No submental, submandibular, tonsillar, preauricular, posterior auricular or occipital adenopathy.     Left side of head: No submental, submandibular, tonsillar, preauricular, posterior auricular or occipital adenopathy.     Cervical: No cervical adenopathy.  Skin:    General: Skin is warm and dry.  Neurological:     Mental Status: She is alert.  Psychiatric:        Speech: Speech normal.        Behavior: Behavior normal.        Thought Content: Thought content normal.   Treated for bilateral cerumen impactions, R > L.  Irrigation stopped prematurely patient felt "room start to spin".  No associated chest pain, shortness of breath, syncope.  Resolved prior to leaving the exam room

## 2023-11-01 NOTE — Patient Instructions (Addendum)
 Start OTC antihistamine daily to see if helps to relieve pressure You may also try over-the-counter Flonase .  Please complete prescription for Debrox.    I placed an urgent referral to [facility].  You may call them as well to see if you cannot get a sooner appoint

## 2023-11-01 NOTE — Assessment & Plan Note (Signed)
 Unable to complete ear irrigation due to brief vertigo during procedure, quickly resolved. Advised to start over-the-counter antihistamine, Flonase  for potentially associated eustachian tube dysfunction, sinusitis r/t viral v allergies Advised to complete Debrox OTC.  Urgent referral placed to ENT as patient is very uncomfortable.  She will let me know how she is doing.

## 2023-11-04 ENCOUNTER — Other Ambulatory Visit: Payer: Self-pay | Admitting: Family Medicine

## 2023-11-04 ENCOUNTER — Encounter: Payer: Self-pay | Admitting: Family

## 2023-11-04 MED ORDER — FLUTICASONE PROPIONATE 50 MCG/ACT NA SUSP: 2.0000 | Freq: Two times a day (BID) | NASAL | 6 refills | Status: DC

## 2023-11-04 MED ORDER — AZELASTINE HCL 0.1 % NA SOLN: 2.0000 | Freq: Two times a day (BID) | NASAL | 12 refills | Status: DC

## 2023-11-04 MED ORDER — LEVOCETIRIZINE DIHYDROCHLORIDE 5 MG PO TABS: 5.0000 mg | ORAL_TABLET | Freq: Every evening | ORAL | 0 refills | Status: DC

## 2023-11-04 NOTE — Telephone Encounter (Signed)
 Message has been sent to referral team. However is there anything else that pt can take to help with symptoms.

## 2023-11-04 NOTE — Telephone Encounter (Signed)
 Spoke with  ENT and they stated that they have not received this referral. Can someone please assist with this.

## 2023-11-25 ENCOUNTER — Ambulatory Visit: Payer: Managed Care, Other (non HMO) | Admitting: Dermatology

## 2023-11-25 DIAGNOSIS — D2272 Melanocytic nevi of left lower limb, including hip: Secondary | ICD-10-CM

## 2023-11-25 DIAGNOSIS — Z1283 Encounter for screening for malignant neoplasm of skin: Secondary | ICD-10-CM

## 2023-11-25 DIAGNOSIS — L814 Other melanin hyperpigmentation: Secondary | ICD-10-CM | POA: Diagnosis not present

## 2023-11-25 DIAGNOSIS — W908XXA Exposure to other nonionizing radiation, initial encounter: Secondary | ICD-10-CM | POA: Diagnosis not present

## 2023-11-25 DIAGNOSIS — L578 Other skin changes due to chronic exposure to nonionizing radiation: Secondary | ICD-10-CM | POA: Diagnosis not present

## 2023-11-25 DIAGNOSIS — D2362 Other benign neoplasm of skin of left upper limb, including shoulder: Secondary | ICD-10-CM

## 2023-11-25 DIAGNOSIS — L738 Other specified follicular disorders: Secondary | ICD-10-CM

## 2023-11-25 DIAGNOSIS — D1801 Hemangioma of skin and subcutaneous tissue: Secondary | ICD-10-CM

## 2023-11-25 DIAGNOSIS — L821 Other seborrheic keratosis: Secondary | ICD-10-CM

## 2023-11-25 DIAGNOSIS — Z86018 Personal history of other benign neoplasm: Secondary | ICD-10-CM

## 2023-11-25 DIAGNOSIS — D2371 Other benign neoplasm of skin of right lower limb, including hip: Secondary | ICD-10-CM

## 2023-11-25 DIAGNOSIS — D239 Other benign neoplasm of skin, unspecified: Secondary | ICD-10-CM

## 2023-11-25 DIAGNOSIS — D229 Melanocytic nevi, unspecified: Secondary | ICD-10-CM

## 2023-11-25 NOTE — Patient Instructions (Addendum)
 Recommend 45 spf or [place]f [place]   Melanoma ABCDEs  Melanoma is the most dangerous type of skin cancer, and is the leading cause of death from skin disease.  You are more likely to develop melanoma if you: Have light-colored skin, light-colored eyes, or red or blond hair Spend a lot of time in the sun Tan regularly, either outdoors or in a tanning bed Have had blistering sunburns, especially during childhood Have a close family member who has had a melanoma Have atypical moles or large birthmarks  Early detection of melanoma is key since treatment is typically straightforward and cure rates are extremely high if we catch it early.   The first sign of melanoma is often a change in a mole or a new dark spot.  The ABCDE system is a way of remembering the signs of melanoma.  A for asymmetry:  The two halves do not match. B for border:  The edges of the growth are irregular. C for color:  A mixture of colors are present instead of an even brown color. D for diameter:  Melanomas are usually (but not always) greater than 6mm - the size of a pencil eraser. E for evolution:  The spot keeps changing in size, shape, and color.  Please check your skin once per month between visits. You can use a small mirror in front and a large mirror behind you to keep an eye on the back side or your body.   If you see any new or changing lesions before your next follow-up, please call to schedule a visit.  Please continue daily skin protection including broad spectrum sunscreen SPF 30+ to sun-exposed areas, reapplying every 2 hours as needed when you're outdoors.   Staying in the shade or wearing long sleeves, sun glasses (UVA+UVB protection) and wide brim hats (4-inch brim around the entire circumference of the hat) are also recommended for sun protection.    Due to recent changes in healthcare laws, you may see results of your pathology and/or laboratory studies on MyChart before the doctors  have had a chance to review them. We understand that in some cases there may be results that are confusing or concerning to you. Please understand that not all results are received at the same time and often the doctors may need to interpret multiple results in order to provide you with the best plan of care or course of treatment. Therefore, we ask that you please give us  2 business days to thoroughly review all your results before contacting the office for clarification. Should we see a critical lab result, you will be contacted sooner.   If You Need Anything After Your Visit  If you have any questions or concerns for your doctor, please call our main line at [phone] and press option 4 to reach your doctor's medical assistant. If no one answers, please leave a voicemail as directed and we will return your call as soon as possible. Messages left after 4 pm will be answered the following business day.   You may also send us  a message via MyChart. We typically respond to MyChart messages within 1-2 business days.  For prescription refills, please ask your pharmacy to contact our office. Our fax number is [phone].  If you have an urgent issue when the clinic is closed that cannot wait until the next business day, you can page your doctor at the number below.    Please note that while we do our  best to be available for urgent issues outside of office hours, we are not available 24/7.   If you have an urgent issue and are unable to reach us , you may choose to seek medical care at your doctor's office, retail clinic, urgent care center, or emergency room.  If you have a medical emergency, please immediately call 911 or go to the emergency department.  Pager Numbers  - Dr. Bary Likes: [phone]  - Dr. Annette Barters: [phone]  - Dr. Felipe Horton: [phone]   In the event of inclement weather, please call our main line at [phone] for an update on the status of any delays or  closures.  Dermatology Medication Tips: Please keep the boxes that topical medications come in in order to help keep track of the instructions about where and how to use these. Pharmacies typically print the medication instructions only on the boxes and not directly on the medication tubes.   If your medication is too expensive, please contact our office at [phone] option 4 or send us  a message through MyChart.   We are unable to tell what your co-pay for medications will be in advance as this is different depending on your insurance coverage. However, we may be able to find a substitute medication at lower cost or fill out paperwork to get insurance to cover a needed medication.   If a prior authorization is required to get your medication covered by your insurance company, please allow us  1-2 business days to complete this process.  Drug prices often vary depending on where the prescription is filled and some pharmacies may offer cheaper prices.  The website www.goodrx.com contains coupons for medications through different pharmacies. The prices here do not account for what the cost may be with help from insurance (it may be cheaper with your insurance), but the website can give you the price if you did not use any insurance.  - You can print the associated coupon and take it with your prescription to the pharmacy.  - You may also stop by our office during regular business hours and pick up a GoodRx coupon card.  - If you need your prescription sent electronically to a different pharmacy, notify our office through [facility] or by phone at [phone] option 4.     Si Usted Necesita Algo Despus de Su Visita  Tambin puede enviarnos un mensaje a travs de Clinical cytogeneticist. Por lo general respondemos a los mensajes de MyChart en el transcurso de 1 a 2 das hbiles.  Para renovar recetas, por favor pida a su farmacia que se ponga en contacto con nuestra oficina. Franz Jacks de fax  es [place] [phone].  Si tiene un asunto urgente cuando la clnica est cerrada y que no puede esperar hasta el siguiente da hbil, puede llamar/localizar a su doctor(a) al nmero que aparece a continuacin.   Por favor, tenga en cuenta que aunque hacemos todo lo posible para estar disponibles para asuntos urgentes fuera del horario de [place], no estamos disponibles las 24 horas del da, los 7 [address]  [address] de la [place].   Si tiene un problema urgente y no puede comunicarse con nosotros, puede optar por buscar atencin mdica  en el consultorio de su doctor(a), en una clnica privada, en un centro de atencin urgente o en una sala de emergencias.  Si tiene Engineer, drilling, por favor llame inmediatamente al 911 o vaya a la sala de emergencias.  Nmeros de bper  - Dr. Bary Likes: [phone]  - Dra. Annette Barters: [phone]  -  Dr. Felipe Horton: [phone]   En caso de inclemencias del tiempo, por favor llame a nuestra lnea principal al [phone] para una actualizacin sobre el [place] de cualquier retraso o cierre.  Consejos para la medicacin en dermatologa: Por favor, guarde las cajas en las que vienen los medicamentos de uso tpico para ayudarle a seguir las instrucciones sobre dnde y cmo usarlos. Las farmacias generalmente imprimen las instrucciones del medicamento slo en las cajas y no directamente en los tubos del [place].   Si su medicamento es muy caro, por favor, pngase en contacto con Bettyjane Brunet llamando al [phone] y presione la opcin 4 o envenos un mensaje a travs de Clinical cytogeneticist.   No podemos decirle cul ser su copago por los medicamentos por adelantado ya que esto es diferente dependiendo de la cobertura de su seguro. Sin embargo, es posible que podamos encontrar un medicamento sustituto a Audiological scientist un formulario para que el seguro cubra el medicamento que se considera necesario.   Si se requiere una autorizacin previa para que su compaa de seguros  [place] su medicamento, por favor permtanos de 1 a 2 das hbiles para completar este proceso.  Los precios de los medicamentos varan con frecuencia dependiendo del Environmental consultant de dnde se surte la receta y alguna farmacias pueden ofrecer precios ms baratos.  El sitio web www.goodrx.com tiene cupones para medicamentos de Health and safety inspector. Los precios aqu no tienen en cuenta lo que podra costar con la ayuda del seguro (puede ser ms barato con su seguro), pero el sitio web puede darle el precio si no utiliz Tourist information centre manager.  - Puede imprimir el cupn correspondiente y llevarlo con su receta a la farmacia.  - Tambin puede pasar por nuestra oficina durante el horario de atencin regular y Education officer, museum una tarjeta de cupones de GoodRx.  - Si necesita que su receta se enve electrnicamente a una farmacia diferente, informe a nuestra oficina a travs de MyChart de [place] o por telfono llamando al [phone] y presione la opcin 4.

## 2023-11-25 NOTE — Progress Notes (Signed)
   Follow-Up Visit   Subjective  Cynthia Houston is a 58 y.o. female who presents for the following: Skin Cancer Screening and Full Body Skin Exam Hx of dysplastic nevi, hx of sks  The patient presents for Total-Body Skin Exam (TBSE) for skin cancer screening and mole check. The patient has spots, moles and lesions to be evaluated, some may be new or changing and the patient may have concern these could be cancer.    The following portions of the chart were reviewed this encounter and updated as appropriate: medications, allergies, medical history  Review of Systems:  No other skin or systemic complaints except as noted in HPI or Assessment and Plan.  Objective  Well appearing patient in no apparent distress; mood and affect are within normal limits.  A full examination was performed including scalp, head, eyes, ears, nose, lips, neck, chest, axillae, abdomen, back, buttocks, bilateral upper extremities, bilateral lower extremities, hands, feet, fingers, toes, fingernails, and toenails. All findings within normal limits unless otherwise noted below.   Relevant physical exam findings are noted in the Assessment and Plan.    Assessment & Plan   SKIN CANCER SCREENING PERFORMED TODAY.  ACTINIC DAMAGE - Chronic condition, secondary to cumulative UV/sun exposure - diffuse scaly erythematous macules with underlying dyspigmentation - Recommend daily broad spectrum sunscreen SPF 30+ to sun-exposed areas, reapply every 2 hours as needed.  - Staying in the shade or wearing long sleeves, sun glasses (UVA+UVB protection) and wide brim hats (4-inch brim around the entire circumference of the hat) are also recommended for sun protection.  - Call for new or changing lesions.  LENTIGINES, SEBORRHEIC KERATOSES, HEMANGIOMAS - Benign normal skin lesions - SK - Right Lateral Breast 10 x 7 mm waxy tan papule with slightly darker edge - Benign-appearing - Call for any changes   MELANOCYTIC NEVI -  Tan-brown and/or pink-flesh-colored symmetric macules and papules - 4 x 2 mm brown macule appears as two adjacent at left anterior thigh  - Benign appearing on exam today - Observation - Call clinic for new or changing moles - Recommend daily use of broad spectrum spf 30+ sunscreen to sun-exposed areas.    Sebaceous Hyperplasia - Small yellow papules with a central dell at left mid cheek and right paranasal  - Benign-appearing - Observe. Call for changes.  DERMATOFIBROMA Exam: Firm pink/brown papulenodule with dimple sign  at right lateral knee  5 mm Firm pink/brown papulenodule with dimple sign left elbow  Treatment Plan: A dermatofibroma is a benign growth possibly related to trauma, such as an insect bite, cut from shaving, or inflamed acne-type bump.  Treatment options to remove include shave or excision with resulting scar and risk of recurrence.  Since benign-appearing and not bothersome, will observe for now.    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 03/07/2007. No evidence of recurrence today Recommend regular full body skin exams Recommend daily broad spectrum sunscreen SPF 30+ to sun-exposed areas, reapply every 2 hours as needed.  Call if any new or changing lesions are noted between office visits    Return in about 1 year (around 11/24/2024) for TBSE.  I, Randee Busing, CMA, am acting as scribe for Artemio Larry, MD.   Documentation: I have reviewed the above documentation for accuracy and completeness, and I agree with the above.  Artemio Larry, MD

## 2024-01-14 ENCOUNTER — Other Ambulatory Visit: Payer: Self-pay | Admitting: Internal Medicine

## 2024-01-27 ENCOUNTER — Ambulatory Visit (INDEPENDENT_AMBULATORY_CARE_PROVIDER_SITE_OTHER): Admitting: Internal Medicine

## 2024-01-27 ENCOUNTER — Encounter: Payer: Self-pay | Admitting: Internal Medicine

## 2024-01-27 VITALS — BP 120/68 | HR 77 | Ht 63.0 in | Wt 123.6 lb

## 2024-01-27 DIAGNOSIS — Z90711 Acquired absence of uterus with remaining cervical stump: Secondary | ICD-10-CM

## 2024-01-27 DIAGNOSIS — R7301 Impaired fasting glucose: Secondary | ICD-10-CM

## 2024-01-27 DIAGNOSIS — N941 Unspecified dyspareunia: Secondary | ICD-10-CM

## 2024-01-27 DIAGNOSIS — R5383 Other fatigue: Secondary | ICD-10-CM | POA: Diagnosis not present

## 2024-01-27 DIAGNOSIS — Z0000 Encounter for general adult medical examination without abnormal findings: Secondary | ICD-10-CM

## 2024-01-27 DIAGNOSIS — E785 Hyperlipidemia, unspecified: Secondary | ICD-10-CM | POA: Diagnosis not present

## 2024-01-27 DIAGNOSIS — R7401 Elevation of levels of liver transaminase levels: Secondary | ICD-10-CM

## 2024-01-27 DIAGNOSIS — Z1231 Encounter for screening mammogram for malignant neoplasm of breast: Secondary | ICD-10-CM

## 2024-01-27 MED ORDER — SERTRALINE HCL 50 MG PO TABS: 50.0000 mg | ORAL_TABLET | Freq: Every day | ORAL | 1 refills | Status: AC

## 2024-01-27 MED ORDER — TRAZODONE HCL 50 MG PO TABS
25.0000 mg | ORAL_TABLET | Freq: Every evening | ORAL | 3 refills | Status: AC | PRN
Start: 2024-01-27 — End: ?

## 2024-01-27 NOTE — Patient Instructions (Signed)
 I have prescribed a Trazodone  trial   for poor sleep take  1/2 to 1 tablet at bedtime   BUT:  You might want to try using Relaxium for insomnia  (as seen on TV commercials)  FIRST . It is available through [employer] and contains all natural supplements:  Melatonin 5 mg  Chamomile 25 mg Passionflower extract 75 mg GABA 100 mg Ashwaganda extract 125 mg Magnesium citrate, glycinate, oxide (100 mg)  L tryptophan 500 mg Valerest (proprietary  ingredient ; probably valeria root extract)    YOUR LEFT HAND TINGLING SOUNDS LIKE CARPAL TUNNEL SYNDROME (EARLY)

## 2024-01-27 NOTE — Progress Notes (Unsigned)
 Patient ID: Cynthia Houston, female    DOB: [birth year]  Age: 58 y.o. MRN: [id number]  The patient is here for annual preventive examination and management of other chronic and acute problems.   The risk factors are reflected in the social history.   The roster of all physicians providing medical care to patient - is listed in the Snapshot section of the chart.   Activities of daily living:  The patient is 100% independent in all ADLs: dressing, toileting, feeding as well as independent mobility   Home safety : The patient has smoke detectors in the home. They wear seatbelts.  There are no unsecured firearms at home. There is no violence in the home.    There is no risks for hepatitis, STDs or HIV. There is no   history of blood transfusion. They have no travel history to infectious disease endemic areas of the world.   The patient has seen their dentist in the last six month. They have seen their eye doctor in the last year. The patinet  denies slight hearing difficulty with regard to whispered voices and some television programs.  They have deferred audiologic testing in the last year.  They do not  have excessive sun exposure. Discussed the need for sun protection: hats, long sleeves and use of sunscreen if there is significant sun exposure.    Diet: the importance of a healthy diet is discussed. They do have a healthy diet.   The benefits of regular aerobic exercise were discussed. The patient  exercises  3 to 5 days per week  for  60 minutes.    Depression screen: there are no signs or vegative symptoms of depression- irritability, change in appetite, anhedonia, sadness/tearfullness.   The following portions of the patient's history were reviewed and updated as appropriate: allergies, current medications, past family history, past medical history,  past surgical history, past social history  and problem list.   Visual acuity was not assessed per patient preference since the patient has  regular follow up with an  ophthalmologist. Hearing and body mass index were assessed and reviewed.    During the course of the visit the patient was educated and counseled about appropriate screening and preventive services including : fall prevention , diabetes screening, nutrition counseling, colorectal cancer screening, and recommended immunizations.    Chief Complaint:  HPI     Annual Exam    Additional comments: Physical       Last edited by Harriette Raisin, CMA on 01/27/2024 12:59 PM.        Review of Symptoms  Patient denies headache, fevers, malaise, unintentional weight loss, skin rash, eye pain, sinus congestion and sinus pain, sore throat, dysphagia,  hemoptysis , cough, dyspnea, wheezing, chest pain, palpitations, orthopnea, edema, abdominal pain, nausea, melena, diarrhea, constipation, flank pain, dysuria, hematuria, urinary  Frequency, nocturia, numbness, tingling, seizures,  Focal weakness, Loss of consciousness,  Tremor, insomnia, depression, anxiety, and suicidal ideation.    Physical Exam:  BP 132/78   Pulse 77   Ht 5' 3 (1.6 m)   Wt 123 lb 9.6 oz (56.1 kg)   SpO2 99%   BMI 21.89 kg/m    Physical Exam  Assessment and Plan: Encounter for preventive health examination  Hyperlipidemia, unspecified hyperlipidemia type  Impaired fasting glucose  Other fatigue  Encounter for screening mammogram for malignant neoplasm of breast    No follow-ups on file.  Verneita LITTIE Kettering, MD

## 2024-01-28 ENCOUNTER — Ambulatory Visit: Payer: Self-pay | Admitting: Internal Medicine

## 2024-01-28 ENCOUNTER — Encounter: Payer: Self-pay | Admitting: Internal Medicine

## 2024-01-28 LAB — CBC WITH DIFFERENTIAL/PLATELET
Basophils Absolute: 0 x10E3/uL (ref 0.0–0.2)
Basos: 1 %
EOS (ABSOLUTE): 0.2 x10E3/uL (ref 0.0–0.4)
Eos: 3 %
Hematocrit: 42.6 % (ref 34.0–46.6)
Hemoglobin: 13.9 g/dL (ref 11.1–15.9)
Immature Grans (Abs): 0 x10E3/uL (ref 0.0–0.1)
Immature Granulocytes: 0 %
Lymphocytes Absolute: 2.6 x10E3/uL (ref 0.7–3.1)
Lymphs: 44 %
MCH: 30.3 pg (ref 26.6–33.0)
MCHC: 32.6 g/dL (ref 31.5–35.7)
MCV: 93 fL (ref 79–97)
Monocytes Absolute: 0.6 x10E3/uL (ref 0.1–0.9)
Monocytes: 10 %
Neutrophils Absolute: 2.5 x10E3/uL (ref 1.4–7.0)
Neutrophils: 42 %
Platelets: 232 x10E3/uL (ref 150–450)
RBC: 4.59 x10E6/uL (ref 3.77–5.28)
RDW: 13.1 % (ref 11.7–15.4)
WBC: 5.9 x10E3/uL (ref 3.4–10.8)

## 2024-01-28 LAB — COMPREHENSIVE METABOLIC PANEL WITH GFR
ALT: 23 IU/L (ref 0–32)
AST: 19 IU/L (ref 0–40)
Albumin: 4.4 g/dL (ref 3.8–4.9)
Alkaline Phosphatase: 59 IU/L (ref 44–121)
BUN/Creatinine Ratio: 15 (ref 12–28)
BUN: 11 mg/dL (ref 8–27)
Bilirubin Total: 0.4 mg/dL (ref 0.0–1.2)
CO2: 22 mmol/L (ref 20–29)
Calcium: 9.5 mg/dL (ref 8.7–10.3)
Chloride: 102 mmol/L (ref 96–106)
Creatinine, Ser: 0.74 mg/dL (ref 0.57–1.00)
Globulin, Total: 2.6 g/dL (ref 1.5–4.5)
Glucose: 83 mg/dL (ref 70–99)
Potassium: 4.2 mmol/L (ref 3.5–5.2)
Sodium: 140 mmol/L (ref 134–144)
Total Protein: 7 g/dL (ref 6.0–8.5)
eGFR: 93 mL/min/1.73 (ref >59)

## 2024-01-28 LAB — LIPID PANEL
Chol/HDL Ratio: 2.5 ratio (ref 0.0–4.4)
Cholesterol, Total: 222 mg/dL — ABNORMAL HIGH (ref 100–199)
HDL: 90 mg/dL (ref >39)
LDL Chol Calc (NIH): 122 mg/dL — ABNORMAL HIGH (ref 0–99)
Triglycerides: 58 mg/dL (ref 0–149)
VLDL Cholesterol Cal: 10 mg/dL (ref 5–40)

## 2024-01-28 LAB — HEMOGLOBIN A1C
Est. average glucose Bld gHb Est-mCnc: 114 mg/dL
Hgb A1c MFr Bld: 5.6 % (ref 4.8–5.6)

## 2024-01-28 LAB — LDL CHOLESTEROL, DIRECT: LDL Direct: 122 mg/dL — ABNORMAL HIGH (ref 0–99)

## 2024-01-28 LAB — TSH: TSH: 1.82 u[IU]/mL (ref 0.450–4.500)

## 2024-01-28 NOTE — Assessment & Plan Note (Signed)
 Pelvic and PAP smear  done in 2022,   repeat in 2027

## 2024-01-28 NOTE — Assessment & Plan Note (Signed)
 Chronic  . Prior noninvasive workup negative .  Biliary dyskinesia suspected with low normal HIDA scan For lap chole in mid August to hopefully resolve RUQ pain   Lab Results  Component Value Date   ALT 23 01/27/2024   AST 19 01/27/2024   GGT 15 03/06/2017   ALKPHOS 59 01/27/2024   BILITOT 0.4 01/27/2024

## 2024-01-28 NOTE — Assessment & Plan Note (Signed)

## 2024-01-28 NOTE — Assessment & Plan Note (Signed)
 Managed periodically (per patient preference) with vaginal estrogen.  Discussed continued use long term as needed

## 2024-01-31 ENCOUNTER — Encounter: Payer: Managed Care, Other (non HMO) | Admitting: Internal Medicine

## 2024-05-05 ENCOUNTER — Encounter

## 2024-05-06 ENCOUNTER — Ambulatory Visit
Admission: RE | Admit: 2024-05-06 | Discharge: 2024-05-06 | Disposition: A | Discharge status: Home / Self Care | Source: Ambulatory Visit | Attending: Internal Medicine | Admitting: Internal Medicine

## 2024-05-06 DIAGNOSIS — Z1231 Encounter for screening mammogram for malignant neoplasm of breast: Secondary | ICD-10-CM | POA: Insufficient documentation

## 2024-07-29 ENCOUNTER — Ambulatory Visit: Payer: Self-pay

## 2024-07-29 NOTE — Telephone Encounter (Signed)
 Noted.

## 2024-07-29 NOTE — Telephone Encounter (Signed)
 Pt is scheduled to see Vincente, NP on 07/31/2024.

## 2024-07-29 NOTE — Telephone Encounter (Signed)
" ° ° °  FYI Only or Action Required?: FYI only for provider: appointment scheduled on 2/6.  Patient was last seen in primary care on 01/27/2024 by Marylynn Verneita CROME, MD.  Called Nurse Triage reporting Breast Mass.  Symptoms began yesterday.  Interventions attempted: Nothing.  Symptoms are: gradually worsening.  Triage Disposition: See PCP When Office is Open (Within 3 Days)  Patient/caregiver understands and will follow disposition?: Yes   Reason for Triage: Pt found a lump in the right breast and is sore to the touch  Reason for Disposition  Breast lump  Answer Assessment - Initial Assessment Questions 1. SYMPTOM: What's the main symptom you're concerned about?  (e.g., lump, nipple discharge, pain, rash)     Tender lump 2. LOCATION: Where is the tender lump located?     Rt breast 3. ONSET: When did smptoms  start?     yesterday 4. PRIOR HISTORY: Do you have any history of prior problems with your breasts? (e.g., breast cancer, breast implant, fibrocystic breast disease)     no 6. OTHER SYMPTOMS: Do you have any other symptoms? (e.g., breast pain, fever, nipple discharge, redness or rash)     Tender to touch  Protocols used: Breast Symptoms-A-AH   Patient was last seen in primary care on 01/27/2024 by Marylynn Verneita CROME, MD.  Called Nurse Triage reporting   Triage Disposition: See PCP When Office is Open (Within 3 Days)  Patient/caregiver understands and will follow disposition?: Yes "

## 2024-07-31 ENCOUNTER — Ambulatory Visit: Admitting: Nurse Practitioner

## 2024-07-31 ENCOUNTER — Encounter: Payer: Self-pay | Admitting: Nurse Practitioner

## 2024-07-31 VITALS — BP 112/76 | HR 71 | Temp 98.6°F | Ht 63.0 in | Wt 120.4 lb

## 2024-07-31 DIAGNOSIS — N6313 Unspecified lump in the right breast, lower outer quadrant: Secondary | ICD-10-CM | POA: Insufficient documentation

## 2024-07-31 NOTE — Progress Notes (Signed)
 "  Established Patient Office Visit  Subjective:  Patient ID: Cynthia Houston, female    DOB: [birth year]  Age: 59 y.o. MRN: [id number]  CC:  Chief Complaint  Patient presents with   Acute Visit    Right breast lump since Tuesday evening Really sore & burning 5/10   Discussed the use of AI scribe software for clinical note transcription with the patient, who gave verbal consent to proceed.  History of Present Illness  HPI   Cynthia Houston is a 59 year old female who presents with right breast pain and a palpable lump.  She has had right breast soreness since Tuesday with a small, movable lump and burning pain that was most tender to touch on Wednesday and is now less tender. She denies breast discharge, change in color, recent vaccinations, or new medications.  A screening mammogram in November was normal. No family h/o breast cancer.  Past Medical History:  Diagnosis Date   Biliary dyskinesia 12/2022   Chicken pox    Depression    Diffuse cystic mastopathy 2013   Elevated ALT measurement 12/21/2016   Chronic.  Rheumatologic and gastroenterologic referrals done in 2019 with no evidence of liver disease by serologies,  abd ultrasound and HIDA scans      Family history of adverse reaction to anesthesia    Mother - PONV   GERD (gastroesophageal reflux disease)    Headache(784.0)    History of abnormal cervical Pap smear 05/07/2012   ASCUS 2012     Hx of dysplastic nevus 03/07/2007   right lateral thigh, slight to mod   Osteoarthritis    right hand   Paroxysmal tachycardia (HCC)    PONV (postoperative nausea and vomiting)    Tendinitis    right elbow   Urinary tract infection     Past Surgical History:  Procedure Laterality Date   ABDOMINAL HYSTERECTOMY  2008   supracervical    AIKEN OSTEOTOMY Left 07/16/2019   Procedure: PHALANX OSTEOTOMY AKIN;  Surgeon: Lilli Cough, DPM;  Location: [facility];  Service: Podiatry;  Laterality: Left;   BREAST CYST  ASPIRATION Right    neg   BUNIONECTOMY Right 2011   SCREWS PLACED   BUNIONECTOMY Left 07/16/2019   Procedure: LAPIDUS-TYPE;  Surgeon: Lilli Cough, DPM;  Location: [facility];  Service: Podiatry;  Laterality: Left;  LMA WITH BLOCK   COLONOSCOPY  2008   Dr. Viktoria   COLONOSCOPY WITH PROPOFOL  N/A 05/24/2020   Procedure: COLONOSCOPY WITH PROPOFOL ;  Surgeon: Jinny Carmine, MD;  Location: [facility];  Service: Endoscopy;  Laterality: N/A;   DIAGNOSTIC LAPAROSCOPY  1991   ECTOPIC PREGNANCY SURGERY Right 1989   removed right tube   ECTOPIC PREGNANCY SURGERY Left 1993   NEOSALPINGOSTOMY Left 1992   UTERINE FIBROID SURGERY  2002   UTERINE FIBROID SURGERY  2004   WEIL OSTEOTOMY Left 07/16/2019   Procedure: WEIL OSTEOTOMY;  Surgeon: Lilli Cough, DPM;  Location: [facility];  Service: Podiatry;  Laterality: Left;    Family History  Problem Relation Age of Onset   Hyperlipidemia Mother    Hypertension Mother    Arthritis Mother        rheumatoid arthritis   Crohn's disease Father    Heart disease Father 43       CABG   Hyperlipidemia Father    Hypertension Father    Breast cancer Neg Hx     Social History   Socioeconomic History   Marital status: Divorced  Spouse name: Not on file   Number of children: 1   Years of education: Not on file   Highest education level: Not on file  Occupational History   Not on file  Tobacco Use   Smoking status: Never    Passive exposure: Never   Smokeless tobacco: Never  Vaping Use   Vaping status: Never Used  Substance and Sexual Activity   Alcohol use: Yes    Alcohol/week: 2.0 standard drinks of alcohol    Types: 2 Glasses of wine per week    Comment: occassioanal   Drug use: No   Sexual activity: Yes  Other Topics Concern   Not on file  Social History Narrative   Not on file   Social Drivers of Health   Tobacco Use: Low Risk (07/31/2024)   Patient History    Smoking Tobacco Use: Never    Smokeless  Tobacco Use: Never    Passive Exposure: Never  Financial Resource Strain: Not on file  Food Insecurity: Not on file  Transportation Needs: Not on file  Physical Activity: Not on file  Stress: Not on file  Social Connections: Not on file  Intimate Partner Violence: Not on file  Depression (PHQ2-9): Medium Risk (07/31/2024)   Depression (PHQ2-9)    PHQ-2 Score: 6  Alcohol Screen: Not on file  Housing: Not on file  Utilities: Not on file  Health Literacy: Not on file     Outpatient Medications Prior to Visit  Medication Sig Dispense Refill   traZODone  (DESYREL ) 50 MG tablet Take 0.5-1 tablets (25-50 mg total) by mouth at bedtime as needed for sleep. 30 tablet 3   sertraline  (ZOLOFT ) 50 MG tablet Take 1 tablet (50 mg total) by mouth daily. (Patient not taking: Reported on 07/31/2024) 90 tablet 1   No facility-administered medications prior to visit.    Allergies[1]  ROS Review of Systems Negative unless indicated in HPI.    Objective:    Physical Exam Constitutional:      Appearance: Normal appearance.  Cardiovascular:     Rate and Rhythm: Normal rate and regular rhythm.     Pulses: Normal pulses.     Heart sounds: Normal heart sounds.  Pulmonary:     Effort: Pulmonary effort is normal.     Breath sounds: Normal breath sounds. No stridor. No wheezing.  Chest:  Breasts:    Right: Mass and tenderness present. No swelling, inverted nipple, nipple discharge or skin change.       Comments: Movable pea sized tender lump at 9 o' clock position Lymphadenopathy:     Upper Body:     Right upper body: No supraclavicular or axillary adenopathy.  Neurological:     Mental Status: She is alert.     BP 112/76   Pulse 71   Temp 98.6 F (37 C)   Ht 5' 3 (1.6 m)   Wt 120 lb 6.4 oz (54.6 kg)   SpO2 98%   BMI 21.33 kg/m  Wt Readings from Last 3 Encounters:  07/31/24 120 lb 6.4 oz (54.6 kg)  01/27/24 123 lb 9.6 oz (56.1 kg)  11/01/23 122 lb 1.6 oz (55.4 kg)     Health  Maintenance  Topic Date Due   COVID-19 Vaccine (1) Never done   Zoster Vaccines- Shingrix (1 of 2) Never done   Pneumococcal Vaccine: 50+ Years (1 of 1 - PCV) Never done   Influenza Vaccine  09/22/2024 (Originally 01/24/2024)   Mammogram  05/06/2025   Colonoscopy  05/24/2025   Cervical Cancer Screening (HPV/Pap Cotest)  01/03/2026   DTaP/Tdap/Td (3 - Td or Tdap) 12/13/2026   HPV VACCINES (No Doses Required) Completed   Hepatitis C Screening  Completed   HIV Screening  Completed   Hepatitis B Vaccines 19-59 Average Risk  Aged Out   Meningococcal B Vaccine  Aged Out    There are no preventive care reminders to display for this patient.  Lab Results  Component Value Date   TSH 1.820 01/27/2024   Lab Results  Component Value Date   WBC 5.9 01/27/2024   HGB 13.9 01/27/2024   HCT 42.6 01/27/2024   MCV 93 01/27/2024   PLT 232 01/27/2024   Lab Results  Component Value Date   NA 140 01/27/2024   K 4.2 01/27/2024   CO2 22 01/27/2024   GLUCOSE 83 01/27/2024   BUN 11 01/27/2024   CREATININE 0.74 01/27/2024   BILITOT 0.4 01/27/2024   ALKPHOS 59 01/27/2024   AST 19 01/27/2024   ALT 23 01/27/2024   PROT 7.0 01/27/2024   ALBUMIN 4.4 01/27/2024   CALCIUM 9.5 01/27/2024   EGFR 93 01/27/2024   Lab Results  Component Value Date   CHOL 222 (H) 01/27/2024   Lab Results  Component Value Date   HDL 90 01/27/2024   Lab Results  Component Value Date   LDLCALC 122 (H) 01/27/2024   Lab Results  Component Value Date   TRIG 58 01/27/2024   Lab Results  Component Value Date   CHOLHDL 2.5 01/27/2024   Lab Results  Component Value Date   HGBA1C 5.6 01/27/2024      Assessment & Plan:   Assessment & Plan Mass of lower outer quadrant of right breast Movable pea sized tender lump to lower outer quadrant right breast. No skin dimpling, nipple discharge or change in skin color.  - Consider mammogram and US  for right breast for further evaluation   Orders:   MM 3D DIAGNOSTIC  MAMMOGRAM UNILATERAL RIGHT BREAST; Future   US  BREAST COMPLETE UNI RIGHT INC AXILLA; Future    Follow-up: Return if symptoms worsen or fail to improve.   Kiah Keay, NP     [1]  Allergies Allergen Reactions   Sulfa Antibiotics Rash        Fiorinal [Butalbital-Aspirin-Caffeine] Nausea And Vomiting    Also drop in BP   Percocet [Oxycodone -Acetaminophen ] Nausea And Vomiting    Also drop in BP   Augmentin [Amoxicillin-Pot Clavulanate] Rash   Doxycycline  Rash   "

## 2024-07-31 NOTE — Assessment & Plan Note (Addendum)
 Movable pea sized tender lump to lower outer quadrant right breast. No skin dimpling, nipple discharge or change in skin color.  - Consider mammogram and US  for right breast for further evaluation   Orders:   MM 3D DIAGNOSTIC MAMMOGRAM UNILATERAL RIGHT BREAST; Future   US  BREAST COMPLETE UNI RIGHT INC AXILLA; Future

## 2024-12-07 ENCOUNTER — Encounter: Admitting: Dermatology

## 2025-01-27 ENCOUNTER — Encounter: Admitting: Internal Medicine
